# Patient Record
Sex: Female | Born: 1975 | Race: Black or African American | Hispanic: No | Marital: Single | Smoking: Never smoker
Health system: Southern US, Community
[De-identification: ages and names within clinical notes are randomized; demographics above are authoritative.]

## PROBLEM LIST (undated history)

## (undated) DIAGNOSIS — K219 Gastro-esophageal reflux disease without esophagitis: Secondary | ICD-10-CM

## (undated) DIAGNOSIS — F419 Anxiety disorder, unspecified: Secondary | ICD-10-CM

## (undated) DIAGNOSIS — K5792 Diverticulitis of intestine, part unspecified, without perforation or abscess without bleeding: Secondary | ICD-10-CM

## (undated) DIAGNOSIS — E7800 Pure hypercholesterolemia, unspecified: Secondary | ICD-10-CM

## (undated) DIAGNOSIS — L509 Urticaria, unspecified: Secondary | ICD-10-CM

## (undated) DIAGNOSIS — I10 Essential (primary) hypertension: Secondary | ICD-10-CM

## (undated) HISTORY — DX: Anxiety disorder, unspecified: F41.9

## (undated) HISTORY — DX: Urticaria, unspecified: L50.9

---

## 2008-10-12 ENCOUNTER — Emergency Department (HOSPITAL_BASED_OUTPATIENT_CLINIC_OR_DEPARTMENT_OTHER): Admission: EM | Admit: 2008-10-12 | Discharge: 2008-10-12 | Payer: Self-pay | Admitting: Emergency Medicine

## 2010-12-20 ENCOUNTER — Encounter: Payer: Self-pay | Admitting: Internal Medicine

## 2011-08-28 ENCOUNTER — Emergency Department (INDEPENDENT_AMBULATORY_CARE_PROVIDER_SITE_OTHER): Payer: BC Managed Care – PPO

## 2011-08-28 ENCOUNTER — Encounter: Payer: Self-pay | Admitting: Emergency Medicine

## 2011-08-28 ENCOUNTER — Emergency Department (HOSPITAL_BASED_OUTPATIENT_CLINIC_OR_DEPARTMENT_OTHER)
Admission: EM | Admit: 2011-08-28 | Discharge: 2011-08-28 | Disposition: A | Discharge status: Home / Self Care | Payer: BC Managed Care – PPO | Attending: Emergency Medicine | Admitting: Emergency Medicine

## 2011-08-28 ENCOUNTER — Other Ambulatory Visit: Payer: Self-pay

## 2011-08-28 DIAGNOSIS — I10 Essential (primary) hypertension: Secondary | ICD-10-CM | POA: Insufficient documentation

## 2011-08-28 DIAGNOSIS — R0602 Shortness of breath: Secondary | ICD-10-CM

## 2011-08-28 DIAGNOSIS — E7800 Pure hypercholesterolemia, unspecified: Secondary | ICD-10-CM | POA: Insufficient documentation

## 2011-08-28 DIAGNOSIS — R079 Chest pain, unspecified: Secondary | ICD-10-CM | POA: Insufficient documentation

## 2011-08-28 DIAGNOSIS — K219 Gastro-esophageal reflux disease without esophagitis: Secondary | ICD-10-CM | POA: Insufficient documentation

## 2011-08-28 HISTORY — DX: Gastro-esophageal reflux disease without esophagitis: K21.9

## 2011-08-28 HISTORY — DX: Essential (primary) hypertension: I10

## 2011-08-28 HISTORY — DX: Pure hypercholesterolemia, unspecified: E78.00

## 2011-08-28 LAB — CBC
HCT: 36.8 % (ref 36.0–46.0)
MCH: 25.8 pg — ABNORMAL LOW (ref 26.0–34.0)
Platelets: 285 10*3/uL (ref 150–400)
RBC: 4.72 MIL/uL (ref 3.87–5.11)
RDW: 14 % (ref 11.5–15.5)

## 2011-08-28 LAB — DIFFERENTIAL
Basophils Relative: 0 % (ref 0–1)
Lymphocytes Relative: 35 % (ref 12–46)
Lymphs Abs: 2.9 10*3/uL (ref 0.7–4.0)
Monocytes Relative: 9 % (ref 3–12)
Neutro Abs: 4.4 10*3/uL (ref 1.7–7.7)
Neutrophils Relative %: 53 % (ref 43–77)

## 2011-08-28 LAB — BASIC METABOLIC PANEL
Chloride: 100 mEq/L (ref 96–112)
Creatinine, Ser: 0.6 mg/dL (ref 0.50–1.10)
Glucose, Bld: 98 mg/dL (ref 70–99)

## 2011-08-28 LAB — CARDIAC PANEL(CRET KIN+CKTOT+MB+TROPI)
Relative Index: INVALID (ref 0.0–2.5)
Total CK: 99 U/L (ref 7–177)
Troponin I: <0.3 ng/mL (ref <0.30)
Troponin I: <0.3 ng/mL (ref <0.30)

## 2011-08-28 LAB — PROTIME-INR: Prothrombin Time: 13 seconds (ref 11.6–15.2)

## 2011-08-28 MED ORDER — IBUPROFEN 800 MG PO TABS: 800.0000 mg | ORAL_TABLET | Freq: Three times a day (TID) | ORAL | Status: AC

## 2011-08-28 MED ORDER — ASPIRIN 325 MG PO TABS
325.0000 mg | ORAL_TABLET | Freq: Once | ORAL | Status: AC
  Administered 2011-08-28: 325 mg via ORAL
  Filled 2011-08-28: qty 1

## 2011-08-28 MED ORDER — HYDROCODONE-ACETAMINOPHEN 5-325 MG PO TABS
1.0000 | ORAL_TABLET | Freq: Once | ORAL | Status: AC
  Administered 2011-08-28: 1 via ORAL
  Filled 2011-08-28: qty 1

## 2011-08-28 NOTE — ED Notes (Signed)
Pt states she was in choir practice last night and became SOB.  Pt states her chest felt tight and she felt bad.  No dizziness or lightheadedness.  Some nausea, no vomitting.  No diaphoresis.  Pain worse with deep breath and moves to her back and breast bone.  Pt states it felt like it was going down her left arm.

## 2011-08-28 NOTE — ED Provider Notes (Signed)
History     CSN: [id number] Arrival date & time: 08/28/2011 11:40 AM  Chief Complaint  Patient presents with  . Chest Pain  . Shortness of Breath    (Consider location/radiation/quality/duration/timing/severity/associated sxs/prior treatment) HPI Comments: Patient has had constant substernal chest pain for the past week.  It varies in intensity but is always there in some fashion. Never goes away completely. It is substernal radiating underneath her left breast. Today she got concerned when the pain seemed to go down her left arm. She also describes a sensation of difficulty taking a deeper breath, feeling short of breath. She had an episode of what she thinks is anxiety last night at choir practice when she became acutely short of breath and some chest tightness. That resolved with rest. She denies any cough, fever, vomiting, diaphoresis. She has not had any lightheadedness or dizziness. She does have a history of an ulcer, hypertension and hyper lipidemia. She reports having a negative stress test several years ago. Does not smoke or use illicit drugs.  The history is provided by the patient.    Past Medical History  Diagnosis Date  . Hypertension   . Hypercholesteremia   . GERD (gastroesophageal reflux disease)   . Gastric ulcer     History reviewed. No pertinent past surgical history.  History reviewed. No pertinent family history.  History  Substance Use Topics  . Smoking status: Never Smoker   . Smokeless tobacco: Never Used  . Alcohol Use: Yes     occasional    OB History    Grav Para Term Preterm Abortions TAB SAB Ect Mult Living                  Review of Systems  Constitutional: Negative for fever, activity change and appetite change.  HENT: Negative for congestion and rhinorrhea.   Respiratory: Positive for chest tightness and shortness of breath. Negative for cough.   Cardiovascular: Positive for chest pain. Negative for palpitations.  Gastrointestinal:  Positive for nausea. Negative for vomiting and abdominal pain.  Genitourinary: Negative for dysuria and hematuria.  Musculoskeletal: Negative for back pain.  Neurological: Negative for dizziness, syncope and headaches.    Allergies  Review of patient's allergies indicates no known allergies.  Home Medications   Current Outpatient Rx  Name Route Sig Dispense Refill  . ATTAPULGITE 750 MG/15ML PO LIQD Oral Take 15 mLs by mouth 3 (three) times daily.      Marland Kitchen LISINOPRIL-HYDROCHLOROTHIAZIDE 20-25 MG PO TABS Oral Take 1 tablet by mouth daily.      Marland Kitchen OMEPRAZOLE 20 MG PO CPDR Oral Take 20 mg by mouth daily.      Marland Kitchen ZOLPIDEM TARTRATE 10 MG PO TABS Oral Take 10 mg by mouth at bedtime as needed.        BP 116/71  Pulse 76  Temp(Src) 98.2 F (36.8 C) (Oral)  Resp 18  Ht 5\' 6"  (1.676 m)  Wt 228 lb (103.42 kg)  BMI 36.80 kg/m2  SpO2 100%  LMP 08/27/2011  Physical Exam  Constitutional: She is oriented to person, place, and time. She appears well-developed and well-nourished. No distress.  HENT:  Head: Normocephalic and atraumatic.  Mouth/Throat: Oropharynx is clear and moist. No oropharyngeal exudate.  Eyes: Conjunctivae are normal. Pupils are equal, round, and reactive to light.  Neck: Normal range of motion.  Cardiovascular: Normal rate, regular rhythm and normal heart sounds.   No murmur heard. Pulmonary/Chest: Effort normal and breath sounds normal. No respiratory distress. She  exhibits tenderness.       Pain is reproducible to palpation along the left sternal border  Abdominal: Soft. There is no tenderness. There is no rebound and no guarding.  Musculoskeletal: Normal range of motion. She exhibits no edema and no tenderness.  Neurological: She is alert and oriented to person, place, and time. No cranial nerve deficit.  Skin: Skin is warm.    ED Course  Procedures (including critical care time)  Labs Reviewed  CBC - Abnormal; Notable for the following:    MCH 25.8 (*)    All  other components within normal limits  DIFFERENTIAL  BASIC METABOLIC PANEL  D-DIMER, QUANTITATIVE  CARDIAC PANEL(CRET KIN+CKTOT+MB+TROPI)  PROTIME-INR  CARDIAC PANEL(CRET KIN+CKTOT+MB+TROPI)   Dg Chest 2 View  08/28/2011  *RADIOLOGY REPORT*  Clinical Data: Shortness of breath.  CHEST - 2 VIEW  Comparison: Chest x-ray 10/12/2008.  Findings: The cardiac silhouette, mediastinal and hilar contours are within normal limits and stable. The lungs are clear.  No pleural effusions.  The bony thorax is intact.  IMPRESSION: Normal chest x-ray.  No change since prior study.  Original Report Authenticated By: P. Loralie Champagne, M.D.     No diagnosis found.    MDM  Given description of pain and chronicity is atypical for ACS however patient does have risk factors including obesity, hypertension and hyperlipidemia. HEART score is 1 Will obtain labs with Ddimer, CXR.   Date: 08/28/2011  Rate: 75  Rhythm: normal sinus rhythm  QRS Axis: normal  Intervals: normal  ST/T Wave abnormalities: nonspecific T wave changes  Conduction Disutrbances:none  Narrative Interpretation: T-wave flattening laterally is unchanged  Old EKG Reviewed: unchanged  Chest x-ray, D-dimer and other labs are normal.  Delta troponin is negative.  I do not think patient's symptoms are consistent with ACS. She does have some reproducibility to palpation and a likely component of anxiety as well. I feel she has been ruled out from an emergency standpoint in terms of life threatening causes of chest pain occurs followup with her doctor for an outpatient stress test as indicated.        Glynn Octave, MD 08/28/11 [phone]

## 2011-09-01 LAB — DIFFERENTIAL
Basophils Relative: 1
Monocytes Absolute: 0.5
Neutro Abs: 4.3
Neutrophils Relative %: 56

## 2011-09-01 LAB — BASIC METABOLIC PANEL
Calcium: 9.3
Chloride: 99
GFR calc Af Amer: >60
GFR calc non Af Amer: >60
Potassium: 3.5

## 2011-09-01 LAB — POCT CARDIAC MARKERS
CKMB, poc: <1 — ABNORMAL LOW
Troponin i, poc: <0.05

## 2011-09-01 LAB — CBC
HCT: 38.3
MCV: 77.4 — ABNORMAL LOW

## 2011-09-22 ENCOUNTER — Ambulatory Visit: Payer: BC Managed Care – PPO | Admitting: Internal Medicine

## 2011-10-14 ENCOUNTER — Ambulatory Visit: Payer: BC Managed Care – PPO | Admitting: Internal Medicine

## 2011-10-25 ENCOUNTER — Encounter (HOSPITAL_BASED_OUTPATIENT_CLINIC_OR_DEPARTMENT_OTHER): Payer: Self-pay | Admitting: *Deleted

## 2011-10-25 ENCOUNTER — Emergency Department (INDEPENDENT_AMBULATORY_CARE_PROVIDER_SITE_OTHER): Payer: BC Managed Care – PPO

## 2011-10-25 ENCOUNTER — Emergency Department (HOSPITAL_BASED_OUTPATIENT_CLINIC_OR_DEPARTMENT_OTHER)
Admission: EM | Admit: 2011-10-25 | Discharge: 2011-10-25 | Disposition: A | Discharge status: Home / Self Care | Payer: BC Managed Care – PPO | Attending: Emergency Medicine | Admitting: Emergency Medicine

## 2011-10-25 DIAGNOSIS — K5730 Diverticulosis of large intestine without perforation or abscess without bleeding: Secondary | ICD-10-CM | POA: Insufficient documentation

## 2011-10-25 DIAGNOSIS — R109 Unspecified abdominal pain: Secondary | ICD-10-CM

## 2011-10-25 DIAGNOSIS — I10 Essential (primary) hypertension: Secondary | ICD-10-CM | POA: Insufficient documentation

## 2011-10-25 DIAGNOSIS — E7800 Pure hypercholesterolemia, unspecified: Secondary | ICD-10-CM | POA: Insufficient documentation

## 2011-10-25 DIAGNOSIS — R110 Nausea: Secondary | ICD-10-CM

## 2011-10-25 DIAGNOSIS — K449 Diaphragmatic hernia without obstruction or gangrene: Secondary | ICD-10-CM

## 2011-10-25 DIAGNOSIS — K5790 Diverticulosis of intestine, part unspecified, without perforation or abscess without bleeding: Secondary | ICD-10-CM

## 2011-10-25 LAB — CBC
MCHC: 33.2 g/dL (ref 30.0–36.0)
Platelets: 278 10*3/uL (ref 150–400)
RDW: 14.3 % (ref 11.5–15.5)
WBC: 12.7 10*3/uL — ABNORMAL HIGH (ref 4.0–10.5)

## 2011-10-25 LAB — HEPATIC FUNCTION PANEL
Albumin: 3.6 g/dL (ref 3.5–5.2)
Alkaline Phosphatase: 65 U/L (ref 39–117)
Total Protein: 7.3 g/dL (ref 6.0–8.3)

## 2011-10-25 LAB — URINE MICROSCOPIC-ADD ON

## 2011-10-25 LAB — BASIC METABOLIC PANEL
BUN: 14 mg/dL (ref 6–23)
GFR calc non Af Amer: >90 mL/min (ref >90)
Glucose, Bld: 107 mg/dL — ABNORMAL HIGH (ref 70–99)
Potassium: 3.7 mEq/L (ref 3.5–5.1)

## 2011-10-25 LAB — URINALYSIS, ROUTINE W REFLEX MICROSCOPIC
Bilirubin Urine: NEGATIVE
Hgb urine dipstick: NEGATIVE
Ketones, ur: NEGATIVE mg/dL
Nitrite: NEGATIVE
Specific Gravity, Urine: 1.016 (ref 1.005–1.030)

## 2011-10-25 LAB — DIFFERENTIAL
Eosinophils Absolute: 0.2 10*3/uL (ref 0.0–0.7)
Eosinophils Relative: 2 % (ref 0–5)
Lymphs Abs: 3.7 10*3/uL (ref 0.7–4.0)
Monocytes Absolute: 0.9 10*3/uL (ref 0.1–1.0)
Monocytes Relative: 7 % (ref 3–12)
Neutrophils Relative %: 62 % (ref 43–77)

## 2011-10-25 LAB — PREGNANCY, URINE: Preg Test, Ur: NEGATIVE

## 2011-10-25 MED ORDER — METRONIDAZOLE 500 MG PO TABS: 500.0000 mg | ORAL_TABLET | Freq: Two times a day (BID) | ORAL | Status: AC

## 2011-10-25 MED ORDER — CIPROFLOXACIN HCL 500 MG PO TABS: 500.0000 mg | ORAL_TABLET | Freq: Two times a day (BID) | ORAL | Status: AC

## 2011-10-25 MED ORDER — ONDANSETRON HCL 4 MG/2ML IJ SOLN
4.0000 mg | Freq: Once | INTRAMUSCULAR | Status: AC
  Administered 2011-10-25: 4 mg via INTRAVENOUS
  Filled 2011-10-25: qty 2

## 2011-10-25 MED ORDER — FLUCONAZOLE 50 MG PO TABS
150.0000 mg | ORAL_TABLET | Freq: Once | ORAL | Status: AC
  Administered 2011-10-25: 150 mg via ORAL
  Filled 2011-10-25: qty 1

## 2011-10-25 MED ORDER — FENTANYL CITRATE 0.05 MG/ML IJ SOLN
25.0000 ug | Freq: Once | INTRAMUSCULAR | Status: AC
  Administered 2011-10-25: 25 ug via INTRAVENOUS
  Filled 2011-10-25: qty 2

## 2011-10-25 MED ORDER — FAMOTIDINE IN NACL 20-0.9 MG/50ML-% IV SOLN
20.0000 mg | Freq: Once | INTRAVENOUS | Status: AC
  Administered 2011-10-25: 20 mg via INTRAVENOUS
  Filled 2011-10-25: qty 50

## 2011-10-25 MED ORDER — ONDANSETRON HCL 4 MG PO TABS: 4.0000 mg | ORAL_TABLET | Freq: Four times a day (QID) | ORAL | Status: AC

## 2011-10-25 MED ORDER — HYDROCODONE-ACETAMINOPHEN 5-500 MG PO TABS: 1.0000 | ORAL_TABLET | Freq: Four times a day (QID) | ORAL | Status: AC | PRN

## 2011-10-25 MED ORDER — FENTANYL CITRATE 0.05 MG/ML IJ SOLN
INTRAMUSCULAR | Status: AC
  Administered 2011-10-25: 25 ug
  Filled 2011-10-25: qty 2

## 2011-10-25 MED ORDER — PANTOPRAZOLE SODIUM 40 MG IV SOLR
40.0000 mg | Freq: Once | INTRAVENOUS | Status: AC
  Administered 2011-10-25: 40 mg via INTRAVENOUS
  Filled 2011-10-25: qty 40

## 2011-10-25 MED ORDER — IOHEXOL 300 MG/ML  SOLN
100.0000 mL | Freq: Once | INTRAMUSCULAR | Status: AC | PRN
  Administered 2011-10-25: 100 mL via INTRAVENOUS

## 2011-10-25 NOTE — ED Notes (Signed)
Pt reports generalized abd pain and nausea that began yesterday Pm around 1700 pt took a gas x last Pm with no relief

## 2011-10-25 NOTE — ED Provider Notes (Signed)
History     CSN: [id number] Arrival date & time: 10/25/2011  4:36 AM   First MD Initiated Contact with Patient 10/25/11 0507      Chief Complaint  Patient presents with  . Abdominal Pain    (Consider location/radiation/quality/duration/timing/severity/associated sxs/prior treatment) Patient is a 35 y.o. female presenting with abdominal pain. The history is provided by the patient.  Abdominal Pain The primary symptoms of the illness include abdominal pain. The primary symptoms of the illness do not include fever, shortness of breath, vomiting, diarrhea or dysuria. The current episode started 6 to 12 hours ago. The onset of the illness was gradual. The problem has not changed since onset. Associated with: Started about 12 hours after eating a hamburger. Symptoms associated with the illness do not include chills, constipation or back pain.   pain constant since onset and are relieved by over-the-counter medications at home. Some nausea no vomiting. No diarrhea or constipation. No history of similar pain. Sharp in quality and not radiating from "all over" but mostly upper abdomen.  Past Medical History  Diagnosis Date  . Hypertension   . Hypercholesteremia   . GERD (gastroesophageal reflux disease)   . Gastric ulcer     History reviewed. No pertinent past surgical history.  History reviewed. No pertinent family history.  History  Substance Use Topics  . Smoking status: Never Smoker   . Smokeless tobacco: Never Used  . Alcohol Use: Yes     occasional    OB History    Grav Para Term Preterm Abortions TAB SAB Ect Mult Living                  Review of Systems  Constitutional: Negative for fever and chills.  HENT: Negative for neck pain and neck stiffness.   Eyes: Negative for pain.  Respiratory: Negative for shortness of breath.   Cardiovascular: Negative for chest pain.  Gastrointestinal: Positive for abdominal pain. Negative for vomiting, diarrhea, constipation, blood  in stool and rectal pain.  Genitourinary: Negative for dysuria, flank pain and pelvic pain.  Musculoskeletal: Negative for back pain.  Skin: Negative for rash.  Neurological: Negative for headaches.  All other systems reviewed and are negative.    Allergies  Review of patient's allergies indicates no known allergies.  Home Medications   Current Outpatient Rx  Name Route Sig Dispense Refill  . ATTAPULGITE 750 MG/15ML PO LIQD Oral Take 15 mLs by mouth 3 (three) times daily.      Marland Kitchen LISINOPRIL-HYDROCHLOROTHIAZIDE 20-25 MG PO TABS Oral Take 1 tablet by mouth daily.      Marland Kitchen OMEPRAZOLE 20 MG PO CPDR Oral Take 20 mg by mouth daily.      Marland Kitchen ZOLPIDEM TARTRATE 10 MG PO TABS Oral Take 10 mg by mouth at bedtime as needed.        BP 112/71  Pulse 91  Temp(Src) 98.9 F (37.2 C) (Oral)  Resp 20  Ht 5\' 6"  (1.676 m)  Wt 224 lb (101.606 kg)  BMI 36.15 kg/m2  SpO2 100%  LMP 10/18/2011  Physical Exam  Constitutional: She is oriented to person, place, and time. She appears well-developed and well-nourished.  HENT:  Head: Normocephalic and atraumatic.  Eyes: Conjunctivae and EOM are normal. Pupils are equal, round, and reactive to light.  Neck: Trachea normal. Neck supple. No thyromegaly present.  Cardiovascular: Normal rate, regular rhythm, S1 normal, S2 normal and normal pulses.     No systolic murmur is present   No diastolic murmur  is present  Pulses:      Radial pulses are 2+ on the right side, and 2+ on the left side.  Pulmonary/Chest: Effort normal and breath sounds normal. She has no wheezes. She has no rhonchi. She has no rales. She exhibits no tenderness.  Abdominal: Soft. Normal appearance and bowel sounds are normal. She exhibits no mass. There is tenderness. There is no rebound, no guarding, no CVA tenderness and negative Murphy's sign.       Obese with tenderness mostly periumbical and LLQ  Musculoskeletal:       BLE:s Calves nontender, no cords or erythema, negative Homans sign   Neurological: She is alert and oriented to person, place, and time. She has normal strength. No cranial nerve deficit or sensory deficit. GCS eye subscore is 4. GCS verbal subscore is 5. GCS motor subscore is 6.  Skin: Skin is warm and dry. No rash noted. She is not diaphoretic.  Psychiatric: Her speech is normal.       Cooperative and appropriate    ED Course  Procedures (including critical care time)  Results for orders placed during the hospital encounter of 10/25/11  PREGNANCY, URINE      Component Value Range   Preg Test, Ur NEGATIVE    URINALYSIS, ROUTINE W REFLEX MICROSCOPIC      Component Value Range   Color, Urine YELLOW  YELLOW    Appearance CLEAR  CLEAR    Specific Gravity, Urine 1.016  1.005 - 1.030    pH 6.0  5.0 - 8.0    Glucose, UA NEGATIVE  NEGATIVE (mg/dL)   Hgb urine dipstick NEGATIVE  NEGATIVE    Bilirubin Urine NEGATIVE  NEGATIVE    Ketones, ur NEGATIVE  NEGATIVE (mg/dL)   Protein, ur NEGATIVE  NEGATIVE (mg/dL)   Urobilinogen, UA 0.2  0.0 - 1.0 (mg/dL)   Nitrite NEGATIVE  NEGATIVE    Leukocytes, UA SMALL (*) NEGATIVE   CBC      Component Value Range   WBC 12.7 (*) 4.0 - 10.5 (K/uL)   RBC 4.59  3.87 - 5.11 (MIL/uL)   Hemoglobin 12.0  12.0 - 15.0 (g/dL)   HCT 54.0  98.1 - 19.1 (%)   MCV 78.6  78.0 - 100.0 (fL)   MCH 26.1  26.0 - 34.0 (pg)   MCHC 33.2  30.0 - 36.0 (g/dL)   RDW 47.8  29.5 - 62.1 (%)   Platelets 278  150 - 400 (K/uL)  DIFFERENTIAL      Component Value Range   Neutrophils Relative 62  43 - 77 (%)   Neutro Abs 7.9 (*) 1.7 - 7.7 (K/uL)   Lymphocytes Relative 29  12 - 46 (%)   Lymphs Abs 3.7  0.7 - 4.0 (K/uL)   Monocytes Relative 7  3 - 12 (%)   Monocytes Absolute 0.9  0.1 - 1.0 (K/uL)   Eosinophils Relative 2  0 - 5 (%)   Eosinophils Absolute 0.2  0.0 - 0.7 (K/uL)   Basophils Relative 0  0 - 1 (%)   Basophils Absolute 0.0  0.0 - 0.1 (K/uL)  BASIC METABOLIC PANEL      Component Value Range   Sodium 137  135 - 145 (mEq/L)   Potassium  3.7  3.5 - 5.1 (mEq/L)   Chloride 100  96 - 112 (mEq/L)   CO2 28  19 - 32 (mEq/L)   Glucose, Bld 107 (*) 70 - 99 (mg/dL)   BUN 14  6 - 23 (  mg/dL)   Creatinine, Ser 6.57  0.50 - 1.10 (mg/dL)   Calcium 9.1  8.4 - 84.6 (mg/dL)   GFR calc non Af Amer >90  >90 (mL/min)   GFR calc Af Amer >90  >90 (mL/min)  URINE MICROSCOPIC-ADD ON      Component Value Range   Squamous Epithelial / LPF FEW (*) RARE    WBC, UA 7-10  <3 (WBC/hpf)   RBC / HPF 0-2  <3 (RBC/hpf)   Bacteria, UA MANY (*) RARE   LIPASE, BLOOD      Component Value Range   Lipase 40  11 - 59 (U/L)  HEPATIC FUNCTION PANEL      Component Value Range   Total Protein 7.3  6.0 - 8.3 (g/dL)   Albumin 3.6  3.5 - 5.2 (g/dL)   AST 16  0 - 37 (U/L)   ALT 15  0 - 35 (U/L)   Alkaline Phosphatase 65  39 - 117 (U/L)   Total Bilirubin 0.6  0.3 - 1.2 (mg/dL)   Bilirubin, Direct 0.1  0.0 - 0.3 (mg/dL)   Indirect Bilirubin 0.5  0.3 - 0.9 (mg/dL)   CT reviewed with radiologist has hiatal hernia, diverticulosis and possible mild diverticulitis     MDM   ABD pain evaluated with ;abs, CT and treated with pain medications and IVFs. Nausea improved with zofran. Pain improved. PT had GI physician and is stable for outpatient treatment mild diverticular disease.         Sunnie Nielsen, MD 10/25/11 [phone]

## 2011-10-26 LAB — URINE CULTURE
Colony Count: NO GROWTH
Culture: NO GROWTH

## 2011-11-15 ENCOUNTER — Ambulatory Visit (INDEPENDENT_AMBULATORY_CARE_PROVIDER_SITE_OTHER): Payer: BC Managed Care – PPO | Admitting: Internal Medicine

## 2011-11-15 ENCOUNTER — Encounter: Payer: Self-pay | Admitting: Internal Medicine

## 2011-11-15 DIAGNOSIS — I10 Essential (primary) hypertension: Secondary | ICD-10-CM | POA: Insufficient documentation

## 2011-11-15 DIAGNOSIS — K219 Gastro-esophageal reflux disease without esophagitis: Secondary | ICD-10-CM | POA: Insufficient documentation

## 2011-11-15 DIAGNOSIS — G4700 Insomnia, unspecified: Secondary | ICD-10-CM | POA: Insufficient documentation

## 2011-11-15 MED ORDER — ZOLPIDEM TARTRATE 10 MG PO TABS: 10.0000 mg | ORAL_TABLET | Freq: Every evening | ORAL | Status: DC | PRN

## 2011-11-15 MED ORDER — DEXLANSOPRAZOLE 60 MG PO CPDR: 60.0000 mg | DELAYED_RELEASE_CAPSULE | Freq: Every day | ORAL | Status: AC

## 2011-11-15 NOTE — Patient Instructions (Addendum)

## 2011-11-15 NOTE — Assessment & Plan Note (Signed)
Her BP is well controlled 

## 2011-11-15 NOTE — Assessment & Plan Note (Signed)
She has not done well on prilosec and she tells me that she previously took dexilant so I asked her to restart that

## 2011-11-15 NOTE — Assessment & Plan Note (Signed)
She will continue ambien as needed 

## 2011-11-15 NOTE — Progress Notes (Signed)
Subjective:    Patient ID: Faith Sellers, female    DOB: [birth year], 35 y.o.   MRN: [id number]  Gastrophageal Reflux She complains of heartburn. She reports no abdominal pain, no belching, no chest pain, no choking, no coughing, no dysphagia, no early satiety, no globus sensation, no hoarse voice, no nausea, no sore throat, no stridor, no tooth decay, no water brash or no wheezing. This is a recurrent problem. The current episode started more than 1 year ago. The problem occurs frequently. The problem has been unchanged. The heartburn duration is several minutes. The heartburn is located in the substernum. The heartburn is of mild intensity. The heartburn does not wake her from sleep. The heartburn does not limit her activity. The heartburn doesn't change with position. The symptoms are aggravated by certain foods. Pertinent negatives include no anemia, fatigue, melena, muscle weakness, orthopnea or weight loss. Risk factors include no known risk factors. She has tried a PPI for the symptoms. The treatment provided mild relief. Past procedures include an EGD.  Hypertension This is a chronic problem. The current episode started more than 1 year ago. The problem has been gradually improving since onset. The problem is controlled. Pertinent negatives include no anxiety, blurred vision, chest pain, headaches, malaise/fatigue, neck pain, orthopnea, palpitations, peripheral edema, PND, shortness of breath or sweats. There are no associated agents to hypertension. Past treatments include ACE inhibitors and diuretics. The current treatment provides significant improvement. Compliance problems include exercise and diet.       Review of Systems  Constitutional: Negative for fever, weight loss, malaise/fatigue, diaphoresis, activity change, appetite change, fatigue and unexpected weight change.  HENT: Negative for sore throat, hoarse voice, facial swelling, trouble swallowing, neck pain, neck stiffness and voice  change.   Eyes: Negative.  Negative for blurred vision.  Respiratory: Negative for apnea, cough, choking, chest tightness, shortness of breath, wheezing and stridor.   Cardiovascular: Negative for chest pain, palpitations, orthopnea, leg swelling and PND.  Gastrointestinal: Positive for heartburn. Negative for dysphagia, nausea, vomiting, abdominal pain, diarrhea, constipation, blood in stool, melena, abdominal distention and rectal pain.  Genitourinary: Negative.   Musculoskeletal: Negative for myalgias, back pain, joint swelling, arthralgias, gait problem and muscle weakness.  Skin: Negative for color change, pallor, rash and wound.  Neurological: Negative for dizziness, tremors, seizures, syncope, facial asymmetry, speech difficulty, weakness, light-headedness, numbness and headaches.  Hematological: Negative for adenopathy. Does not bruise/bleed easily.  Psychiatric/Behavioral: Positive for sleep disturbance (DFA). Negative for suicidal ideas, hallucinations, behavioral problems, confusion, self-injury, dysphoric mood, decreased concentration and agitation. The patient is not nervous/anxious and is not hyperactive.        Objective:   Physical Exam  Vitals reviewed. Constitutional: She is oriented to person, place, and time. She appears well-developed and well-nourished. No distress.  HENT:  Head: Normocephalic and atraumatic.  Mouth/Throat: Oropharynx is clear and moist. No oropharyngeal exudate.  Eyes: Conjunctivae are normal. Right eye exhibits no discharge. Left eye exhibits no discharge. No scleral icterus.  Neck: Normal range of motion. Neck supple. No JVD present. No tracheal deviation present. No thyromegaly present.  Cardiovascular: Normal rate, regular rhythm, normal heart sounds and intact distal pulses.  Exam reveals no gallop and no friction rub.   No murmur heard. Pulmonary/Chest: Effort normal and breath sounds normal. No stridor. No respiratory distress. She has no  wheezes. She has no rales. She exhibits no tenderness.  Abdominal: Soft. Bowel sounds are normal. She exhibits no distension. There is no tenderness. There is no  rebound and no guarding.  Musculoskeletal: Normal range of motion. She exhibits no edema and no tenderness.  Lymphadenopathy:    She has no cervical adenopathy.  Neurological: She is oriented to person, place, and time.  Skin: Skin is warm and dry. No rash noted. She is not diaphoretic. No erythema. No pallor.  Psychiatric: She has a normal mood and affect. Her behavior is normal. Judgment and thought content normal.      Lab Results  Component Value Date   WBC 12.7* 10/25/2011   HGB 12.0 10/25/2011   HCT 36.1 10/25/2011   PLT 278 10/25/2011   GLUCOSE 107* 10/25/2011   ALT 15 10/25/2011   AST 16 10/25/2011   NA 137 10/25/2011   K 3.7 10/25/2011   CL 100 10/25/2011   CREATININE 0.60 10/25/2011   BUN 14 10/25/2011   CO2 28 10/25/2011   INR 0.96 08/28/2011     Assessment & Plan:

## 2012-02-02 ENCOUNTER — Encounter: Payer: Self-pay | Admitting: Women's Health

## 2012-02-02 ENCOUNTER — Ambulatory Visit (INDEPENDENT_AMBULATORY_CARE_PROVIDER_SITE_OTHER): Payer: BC Managed Care – PPO | Admitting: Women's Health

## 2012-02-02 VITALS — BP 126/78 | Ht 66.0 in | Wt 223.0 lb

## 2012-02-02 DIAGNOSIS — B379 Candidiasis, unspecified: Secondary | ICD-10-CM

## 2012-02-02 DIAGNOSIS — N920 Excessive and frequent menstruation with regular cycle: Secondary | ICD-10-CM

## 2012-02-02 DIAGNOSIS — B49 Unspecified mycosis: Secondary | ICD-10-CM

## 2012-02-02 DIAGNOSIS — Z113 Encounter for screening for infections with a predominantly sexual mode of transmission: Secondary | ICD-10-CM

## 2012-02-02 LAB — HM PAP SMEAR: HM Pap smear: NORMAL

## 2012-02-02 MED ORDER — TRANEXAMIC ACID 650 MG PO TABS: 1300.0000 mg | ORAL_TABLET | Freq: Three times a day (TID) | ORAL | Status: DC

## 2012-02-02 MED ORDER — FLUCONAZOLE 150 MG PO TABS: 150.0000 mg | ORAL_TABLET | Freq: Once | ORAL | Status: AC

## 2012-02-02 NOTE — Progress Notes (Signed)
Patient ID: Faith Sellers, female   DOB: 03/22/1976, 36 y.o.   MRN: [id number] Presents to discuss contraception options, menorrhagia, and to establish self for ongoing care. Had a normal annual exam in June of 2012 and a normal TSH one month ago. Divorced, has been sexually active with a new partner/ condom. Hypertensive on lisinopril, nonsmoker. Has used Lysteda in the past with good relief. Currently on cycle. States first 2 days of cycle uses 9-10 pads per day following days she uses about 6 pads per day. Had a normal ultrasound March of 2012-no fibroids  Exam: External genitalia within normal limits, speculum exam copious menstrual type blood. Bimanual no CMT or adnexal fullness or tenderness.  Menorrhagia Contraception counseling Hypertension-primary care labs and meds Obesity  Plan: Finishing out a prescription for upper respiratory infection. Diflucan 150 by mouth with refill given if problems with yeast after antibiotic. Mirena IUD discussed, slight risk for infection, perforation hemorrhage. Handout given and reviewed. Will check insurance coverage, schedule with Dr. Audie Box with a cycle to have placed. lysteda prescription, proper use, to take with cycles. Did review slight increased risk for clots. GC/Chlamydia culture taken and is pending. Declined need for HIV, hepatitis or RPR. Encouraged to continue condoms until placed. Reviewed best to avoid combination birth control pills due to hypertension. Is in the process of trying to decrease calories and increase exercise for weight loss. Encourage counseling for recent  divorce. Return to office in June for annual exam.

## 2012-02-02 NOTE — Patient Instructions (Signed)

## 2012-02-09 ENCOUNTER — Telehealth: Payer: Self-pay | Admitting: *Deleted

## 2012-02-09 NOTE — Telephone Encounter (Signed)
Patient called c/o lump in left breast.  Advised needs ov.

## 2012-02-14 ENCOUNTER — Encounter: Payer: Self-pay | Admitting: *Deleted

## 2012-02-14 NOTE — Progress Notes (Signed)
Patient ID: Faith Sellers, female   DOB: 1976-02-24, 36 y.o.   MRN: [id number] Pt called stating lysteda was written wrong per pharmacy? Left message to call.

## 2012-02-15 ENCOUNTER — Ambulatory Visit (INDEPENDENT_AMBULATORY_CARE_PROVIDER_SITE_OTHER): Payer: BC Managed Care – PPO | Admitting: Gynecology

## 2012-02-15 ENCOUNTER — Encounter: Payer: Self-pay | Admitting: Gynecology

## 2012-02-15 DIAGNOSIS — N63 Unspecified lump in unspecified breast: Secondary | ICD-10-CM

## 2012-02-15 DIAGNOSIS — Z309 Encounter for contraceptive management, unspecified: Secondary | ICD-10-CM

## 2012-02-15 DIAGNOSIS — N926 Irregular menstruation, unspecified: Secondary | ICD-10-CM

## 2012-02-15 DIAGNOSIS — N920 Excessive and frequent menstruation with regular cycle: Secondary | ICD-10-CM

## 2012-02-15 LAB — TSH: TSH: 0.987 u[IU]/mL (ref 0.350–4.500)

## 2012-02-15 NOTE — Progress Notes (Signed)
Patient presents with several issues as noted below.  Exam was Sherrilyn Rist chaperone present Breasts examined In sitting without masses retractions discharge adenopathy. Abdomen soft nontender without masses guarding rebound organomegaly. Pelvic external BUS vagina with moderate menses flow. Cervix normal. Uterus grossly normal, midline mobile nontender. Adnexa without masses or tenderness.  Assessment and plan: 1. Left breast lump. Patient noted several days ago onset of a tender area 6:00 position at the periphery junction of breast and chest wall. On reexam last night she did not feel it but just wanted me to check. Her exam today is normal with no palpable abnormalities in this area or anywhere else in her breasts. Recommend patient continue self breast exams, as long she cannot feel any abnormalities she will follow. If she does feel a recurrence of this abnormality she will let me know and represent for exam and we will consider diagnostic studies to include ultrasound possible mammogram. 2. Irregular menses/menorrhagia. Patient notes her periods will come every other month to every month. She bleeds heavily lasting 7 days with bleedthrough episodes double protection. We'll check baseline labs to include FSH, prolactin, TSH and hCG. Start with sonohysterogram to rule out non-palpable abnormalities and she will follow with me for this.. 3. Contraception. Patient was not consistently using contraception and had talked to [place] about Mirena IUD. She is reluctant to do that now in is considering oral contraceptives. I reviewed the risks of oral contraceptives, particularly with her weight and recent diagnosis of hypertension and the risk of stroke, heart attack, DVT were reviewed with her. I encouraged her to consider Mirena IUD and gave her literature on this and she is going to think more about this. She did use Plan B a week ago and started bleeding irregularly the last day or so and again she'll follow up  for her labs and ultrasound per #2.  She already has a Lysteda ordered for her and she is going to start this today. I reviewed the risks of Lysteda up to including thrombosis risks again stroke heart attack DVT.

## 2012-02-15 NOTE — Patient Instructions (Signed)
Follow up for hormone studies and ultrasound is scheduled.

## 2012-02-16 ENCOUNTER — Telehealth: Payer: Self-pay | Admitting: *Deleted

## 2012-02-16 LAB — HCG, SERUM, QUALITATIVE: Preg, Serum: NEGATIVE

## 2012-02-16 LAB — PROLACTIN: Prolactin: 7.2 ng/mL

## 2012-02-16 NOTE — Telephone Encounter (Signed)
Pt informed of HCG lab results.

## 2012-02-21 ENCOUNTER — Other Ambulatory Visit: Payer: BC Managed Care – PPO

## 2012-02-21 ENCOUNTER — Ambulatory Visit: Payer: BC Managed Care – PPO | Admitting: Gynecology

## 2012-02-29 ENCOUNTER — Telehealth: Payer: Self-pay | Admitting: *Deleted

## 2012-02-29 MED ORDER — FLUCONAZOLE 150 MG PO TABS: 150.0000 mg | ORAL_TABLET | Freq: Once | ORAL | Status: AC

## 2012-02-29 NOTE — Telephone Encounter (Signed)
Pt informed with the below note. 

## 2012-02-29 NOTE — Telephone Encounter (Signed)
Diflucan 150mg x 1 dose

## 2012-02-29 NOTE — Telephone Encounter (Signed)
Pt called c/o yeast infection x 2days tried OTC but no relief. Itching, white discharge only. Pt had taken Levaquin and now has yeast. Pt last OV was 02/15/12. Pt would like dose of diflucan

## 2012-03-01 ENCOUNTER — Ambulatory Visit: Payer: BC Managed Care – PPO | Admitting: Internal Medicine

## 2012-03-02 ENCOUNTER — Ambulatory Visit: Payer: BC Managed Care – PPO | Admitting: Internal Medicine

## 2012-03-03 ENCOUNTER — Other Ambulatory Visit (INDEPENDENT_AMBULATORY_CARE_PROVIDER_SITE_OTHER): Payer: BC Managed Care – PPO

## 2012-03-03 ENCOUNTER — Ambulatory Visit (INDEPENDENT_AMBULATORY_CARE_PROVIDER_SITE_OTHER): Payer: BC Managed Care – PPO | Admitting: Internal Medicine

## 2012-03-03 ENCOUNTER — Encounter: Payer: Self-pay | Admitting: Internal Medicine

## 2012-03-03 VITALS — BP 120/72 | HR 87 | Temp 98.7°F | Resp 16 | Wt 217.0 lb

## 2012-03-03 DIAGNOSIS — D72829 Elevated white blood cell count, unspecified: Secondary | ICD-10-CM

## 2012-03-03 DIAGNOSIS — G4700 Insomnia, unspecified: Secondary | ICD-10-CM

## 2012-03-03 DIAGNOSIS — E7800 Pure hypercholesterolemia, unspecified: Secondary | ICD-10-CM | POA: Insufficient documentation

## 2012-03-03 DIAGNOSIS — I10 Essential (primary) hypertension: Secondary | ICD-10-CM

## 2012-03-03 DIAGNOSIS — R7309 Other abnormal glucose: Secondary | ICD-10-CM

## 2012-03-03 DIAGNOSIS — R7303 Prediabetes: Secondary | ICD-10-CM | POA: Insufficient documentation

## 2012-03-03 DIAGNOSIS — N760 Acute vaginitis: Secondary | ICD-10-CM

## 2012-03-03 LAB — CBC WITH DIFFERENTIAL/PLATELET
Basophils Relative: 0.6 % (ref 0.0–3.0)
Eosinophils Relative: 2.3 % (ref 0.0–5.0)
Lymphocytes Relative: 32 % (ref 12.0–46.0)
Neutrophils Relative %: 55.9 % (ref 43.0–77.0)
RBC: 4.4 Mil/uL (ref 3.87–5.11)
WBC: 9.7 10*3/uL (ref 4.5–10.5)

## 2012-03-03 LAB — COMPREHENSIVE METABOLIC PANEL
AST: 22 U/L (ref 0–37)
Albumin: 4 g/dL (ref 3.5–5.2)
BUN: 12 mg/dL (ref 6–23)
Calcium: 9.6 mg/dL (ref 8.4–10.5)
Chloride: 107 mEq/L (ref 96–112)
Glucose, Bld: 101 mg/dL — ABNORMAL HIGH (ref 70–99)
Potassium: 3.7 mEq/L (ref 3.5–5.1)
Total Protein: 8.2 g/dL (ref 6.0–8.3)

## 2012-03-03 LAB — WET PREP, GENITAL
Trich, Wet Prep: NONE SEEN
Yeast Wet Prep HPF POC: NONE SEEN

## 2012-03-03 LAB — LIPID PANEL: Cholesterol: 163 mg/dL (ref 0–200)

## 2012-03-03 LAB — HEMOGLOBIN A1C: Hgb A1c MFr Bld: 6.2 % (ref 4.6–6.5)

## 2012-03-03 LAB — HCG, QUANTITATIVE, PREGNANCY: hCG, Beta Chain, Quant, S: 1.04 m[IU]/mL

## 2012-03-03 LAB — TSH: TSH: 0.66 u[IU]/mL (ref 0.35–5.50)

## 2012-03-03 MED ORDER — ZOLPIDEM TARTRATE 10 MG PO TABS: 10.0000 mg | ORAL_TABLET | Freq: Every evening | ORAL | Status: DC | PRN

## 2012-03-03 NOTE — Patient Instructions (Signed)
Vaginitis Vaginitis is an infection. It causes soreness, swelling, and redness (inflammation) of the vagina. Many of these infections are sexually transmitted diseases (STDs). Having unprotected sex can cause further problems and complications such as:  Chronic pelvic pain.   Infertility.   Unwanted pregnancy.   Abortion.   Tubal pregnancy.   Infection passed on to the newborn.   Cancer.  CAUSES   Monilia. This is a yeast or fungus infection, not an STD.   Bacterial vaginosis. The normal balance of bacteria in the vagina is disrupted and is replaced by an overgrowth of certain bacteria.   Gonorrhea, chlamydia. These are bacterial infections that are STDs.   Vaginal sponges, diaphragms, and intrauterine devices.   Trichomoniasis. This is a STD infection caused by a parasite.   Viruses like herpes and human papillomavirus. Both are STDs.   Pregnancy.   Immunosuppression. This occurs with certain conditions such as HIV infection or cancer.   Using bubble bath.   Taking certain antibiotic medicines.   Sporadic recurrence can occur if you become sick.   Diabetes.   Steroids.   Allergic reaction. If you have an allergy to:   Douches.   Soaps.   Spermicides.   Condoms.   Scented tampons or vaginal sprays.  SYMPTOMS   Abnormal vaginal discharge.   Itching of the vagina.   Pain in the vagina.   Swelling of the vagina.  In some cases, there are no symptoms. TREATMENT  Treatment will vary depending on the type of infection.  Bacteria or trichomonas are usually treated with oral antibiotics and sometimes vaginal cream or suppositories.   Monilia vaginitis is usually treated with vaginal creams, suppositories, or oral antifungal pills.   Viral vaginitis has no cure. However, the symptoms of herpes (a viral vaginitis) can be treated to relieve the discomfort. Human papillomavirus has no symptoms. However, there are treatments for the diseases caused by human  papillomavirus.   With allergic vaginitis, you need to stop using the product that is causing the problem. Vaginal creams can be used to treat the symptoms.   When treating an STD, the sex partner should also be treated.  HOME CARE INSTRUCTIONS   Take all the medicines as directed by your caregiver.   Do not use scented tampons, soaps, or vaginal sprays.   Do not douche.   Tell your sex partner if you have a vaginal infection or an STD.   Do not have sexual intercourse until you have treated the vaginitis.   Practice safe sex by using condoms.  SEEK MEDICAL CARE IF:   You have abdominal pain.   Your symptoms get worse during treatment.  Document Released: 09/12/2007 Document Revised: 11/04/2011 Document Reviewed: 05/08/2009 [facility] Patient Information 2012 [place], [place].Hypertension As your heart beats, it forces blood through your arteries. This force is your blood pressure. If the pressure is too high, it is called hypertension (HTN) or high blood pressure. HTN is dangerous because you may have it and not know it. High blood pressure may mean that your heart has to work harder to pump blood. Your arteries may be narrow or stiff. The extra work puts you at risk for heart disease, stroke, and other problems.  Blood pressure consists of two numbers, a higher number over a lower, 110/72, for example. It is stated as "110 over 72." The ideal is below 120 for the top number (systolic) and under 80 for the bottom (diastolic). Write down your blood pressure today. You should pay  close attention to your blood pressure if you have certain conditions such as:  Heart failure.   Prior heart attack.   Diabetes   Chronic kidney disease.   Prior stroke.   Multiple risk factors for heart disease.  To see if you have HTN, your blood pressure should be measured while you are seated with your arm held at the level of the heart. It should be measured at least twice. A one-time elevated blood  pressure reading (especially in the Emergency Department) does not mean that you need treatment. There may be conditions in which the blood pressure is different between your right and left arms. It is important to see your caregiver soon for a recheck. Most people have essential hypertension which means that there is not a specific cause. This type of high blood pressure may be lowered by changing lifestyle factors such as:  Stress.   Smoking.   Lack of exercise.   Excessive weight.   Drug/tobacco/alcohol use.   Eating less salt.  Most people do not have symptoms from high blood pressure until it has caused damage to the body. Effective treatment can often prevent, delay or reduce that damage. TREATMENT  When a cause has been identified, treatment for high blood pressure is directed at the cause. There are a large number of medications to treat HTN. These fall into several categories, and your caregiver will help you select the medicines that are best for you. Medications may have side effects. You should review side effects with your caregiver. If your blood pressure stays high after you have made lifestyle changes or started on medicines,   Your medication(s) may need to be changed.   Other problems may need to be addressed.   Be certain you understand your prescriptions, and know how and when to take your medicine.   Be sure to follow up with your caregiver within the time frame advised (usually within two weeks) to have your blood pressure rechecked and to review your medications.   If you are taking more than one medicine to lower your blood pressure, make sure you know how and at what times they should be taken. Taking two medicines at the same time can result in blood pressure that is too low.  SEEK IMMEDIATE MEDICAL CARE IF:  You develop a severe headache, blurred or changing vision, or confusion.   You have unusual weakness or numbness, or a faint feeling.   You have  severe chest or abdominal pain, vomiting, or breathing problems.  MAKE SURE YOU:   Understand these instructions.   Will watch your condition.   Will get help right away if you are not doing well or get worse.  Document Released: 11/15/2005 Document Revised: 11/04/2011 Document Reviewed: 07/05/2008 [facility] Patient Information 2012 [place], [place].

## 2012-03-03 NOTE — Progress Notes (Signed)
Subjective:    Patient ID: Faith Sellers, female    DOB: Dec 12, 1975, 36 y.o.   MRN: [id number]  Vaginal Discharge The patient's primary symptoms include genital itching and a vaginal discharge. The patient's pertinent negatives include no genital lesions, genital odor, genital rash, missed menses, pelvic pain or vaginal bleeding. This is a recurrent problem. The current episode started 1 to 4 weeks ago. The problem occurs constantly. The problem has been gradually worsening. The patient is experiencing no pain. She is not pregnant. Pertinent negatives include no abdominal pain, anorexia, back pain, chills, constipation, diarrhea, discolored urine, dysuria, fever, flank pain, frequency, headaches, hematuria, joint pain, joint swelling, nausea, painful intercourse, rash, sore throat, urgency or vomiting. The vaginal discharge was green, mucopurulent and thick. There has been no bleeding. She has not been passing clots. She has not been passing tissue. She has tried antibiotics and antifungals for the symptoms. The treatment provided no relief. She is sexually active. No, her partner does not have an STD. She uses condoms for contraception. Her menstrual history has been regular.  Hypertension This is a chronic problem. The current episode started more than 1 year ago. The problem has been gradually improving since onset. Pertinent negatives include no anxiety, blurred vision, chest pain, headaches, malaise/fatigue, neck pain, orthopnea, palpitations, peripheral edema, PND, shortness of breath or sweats. Past treatments include ACE inhibitors and diuretics. There are no compliance problems.       Review of Systems  Constitutional: Negative for fever, chills, malaise/fatigue, diaphoresis, activity change, appetite change, fatigue and unexpected weight change.  HENT: Negative.  Negative for sore throat and neck pain.   Eyes: Negative.  Negative for blurred vision.  Respiratory: Negative for apnea, cough,  choking, chest tightness, shortness of breath and stridor.   Cardiovascular: Negative for chest pain, palpitations, orthopnea, leg swelling and PND.  Gastrointestinal: Negative for nausea, vomiting, abdominal pain, diarrhea, constipation, blood in stool, abdominal distention, anal bleeding, rectal pain and anorexia.  Genitourinary: Positive for vaginal discharge. Negative for dysuria, urgency, frequency, hematuria, flank pain, decreased urine volume, vaginal bleeding, enuresis, difficulty urinating, vaginal pain, pelvic pain, dyspareunia and missed menses.  Musculoskeletal: Negative for myalgias, back pain, joint pain, joint swelling and gait problem.  Skin: Negative for color change, pallor, rash and wound.  Neurological: Negative.  Negative for headaches.  Hematological: Negative for adenopathy. Does not bruise/bleed easily.  Psychiatric/Behavioral: Positive for sleep disturbance and dysphoric mood (sad about her sister begin ill with memory loss caused by topamax). Negative for suicidal ideas, behavioral problems, confusion, self-injury, decreased concentration and agitation. The patient is nervous/anxious.        Objective:   Physical Exam  Vitals reviewed. Constitutional: She is oriented to person, place, and time. She appears well-developed and well-nourished. No distress.  HENT:  Head: Normocephalic.  Mouth/Throat: Oropharynx is clear and moist. No oropharyngeal exudate.  Eyes: Conjunctivae are normal. Right eye exhibits no discharge. Left eye exhibits no discharge. No scleral icterus.  Neck: Normal range of motion. Neck supple. No JVD present. No tracheal deviation present. No thyromegaly present.  Cardiovascular: Normal rate, regular rhythm, normal heart sounds and intact distal pulses.  Exam reveals no gallop and no friction rub.   No murmur heard. Pulmonary/Chest: Effort normal and breath sounds normal. No stridor. No respiratory distress. She has no wheezes. She has no rales. She  exhibits no tenderness.  Abdominal: Soft. Bowel sounds are normal. She exhibits no distension and no mass. There is no tenderness. There is no rebound  and no guarding. Hernia confirmed negative in the right inguinal area and confirmed negative in the left inguinal area.  Genitourinary: No breast swelling, tenderness, discharge or bleeding. Pelvic exam was performed with patient supine. No erythema, tenderness or bleeding around the vagina. No foreign body around the vagina. No signs of injury around the vagina. Vaginal discharge found.       Discharge is of a moderate amount and it is thick and mucopurulent  Musculoskeletal: Normal range of motion. She exhibits no edema and no tenderness.  Lymphadenopathy:    She has no cervical adenopathy.       Right: No inguinal adenopathy present.       Left: No inguinal adenopathy present.  Neurological: She is oriented to person, place, and time.  Skin: Skin is warm and dry. No rash noted. She is not diaphoretic. No erythema. No pallor.  Psychiatric: She has a normal mood and affect. Her behavior is normal. Judgment and thought content normal.      Lab Results  Component Value Date   WBC 12.7* 10/25/2011   HGB 12.0 10/25/2011   HCT 36.1 10/25/2011   PLT 278 10/25/2011   GLUCOSE 107* 10/25/2011   ALT 15 10/25/2011   AST 16 10/25/2011   NA 137 10/25/2011   K 3.7 10/25/2011   CL 100 10/25/2011   CREATININE 0.60 10/25/2011   BUN 14 10/25/2011   CO2 28 10/25/2011   TSH 0.987 02/15/2012   INR 0.96 08/28/2011      Assessment & Plan:

## 2012-03-04 ENCOUNTER — Telehealth: Payer: Self-pay

## 2012-03-04 NOTE — Telephone Encounter (Signed)
Pt called and states she was expecting a call from Dr. Yetta Barre regarding lab results.  Pt states a medication was suppose to be called in to her pharmacy.

## 2012-03-05 ENCOUNTER — Encounter: Payer: Self-pay | Admitting: Internal Medicine

## 2012-03-05 MED ORDER — FLUCONAZOLE 150 MG PO TABS: 150.0000 mg | ORAL_TABLET | Freq: Once | ORAL | Status: AC

## 2012-03-05 MED ORDER — METRONIDAZOLE 250 MG PO TABS: 250.0000 mg | ORAL_TABLET | Freq: Two times a day (BID) | ORAL | Status: AC

## 2012-03-05 NOTE — Assessment & Plan Note (Signed)
I will recheck her CBC today 

## 2012-03-05 NOTE — Assessment & Plan Note (Signed)
Her wet prep is + for clue cells so I have ordered flagyl and she wants to take diflucan as well to prevent yeast infection

## 2012-03-05 NOTE — Progress Notes (Signed)
Addended by: Etta Grandchild on: 03/05/2012 01:46 PM   Modules accepted: Orders

## 2012-03-05 NOTE — Assessment & Plan Note (Signed)
Her BP is well controlled, today I will check her lytes and renal function 

## 2012-03-05 NOTE — Assessment & Plan Note (Signed)
I will check her a1c today 

## 2012-03-05 NOTE — Telephone Encounter (Signed)
done

## 2012-03-05 NOTE — Assessment & Plan Note (Signed)
She can continue ambien as needed 

## 2012-03-05 NOTE — Assessment & Plan Note (Signed)
I will check her FLP today 

## 2012-03-09 ENCOUNTER — Encounter: Payer: Self-pay | Admitting: Internal Medicine

## 2012-03-21 ENCOUNTER — Telehealth: Payer: Self-pay | Admitting: *Deleted

## 2012-03-21 DIAGNOSIS — D649 Anemia, unspecified: Secondary | ICD-10-CM

## 2012-03-21 NOTE — Telephone Encounter (Signed)
Pt states received letter in mail stating she was anemic and that she cannot take OTC Iron supplement; request for Rx. Please advise.

## 2012-03-22 NOTE — Telephone Encounter (Signed)
She did not have an iron level done and the letter only said that she is anemic, she needs to come in to have an iron level done

## 2012-03-24 NOTE — Telephone Encounter (Signed)
Returned call lmovm advising per MD

## 2012-03-24 NOTE — Telephone Encounter (Signed)
Addended by: Rock Nephew T on: 03/24/2012 02:55 PM   Modules accepted: Orders

## 2012-04-18 ENCOUNTER — Telehealth: Payer: Self-pay

## 2012-04-18 DIAGNOSIS — K219 Gastro-esophageal reflux disease without esophagitis: Secondary | ICD-10-CM

## 2012-04-18 NOTE — Telephone Encounter (Signed)
[facility] stating that dexilant cost $155.18. Alternatives are lansoprazole 15 mg, 30 mg $9.00. Omeprazole 10 mg or 20 mg $9.00 and pantoprazole 20 mg or 40 mg $9.00. Please advise if you would like to change. Thanks

## 2012-04-19 MED ORDER — PANTOPRAZOLE SODIUM 40 MG PO TBEC: 40.0000 mg | DELAYED_RELEASE_TABLET | Freq: Every day | ORAL | Status: DC

## 2012-04-19 NOTE — Telephone Encounter (Signed)
done

## 2012-06-15 ENCOUNTER — Encounter: Payer: BC Managed Care – PPO | Admitting: Women's Health

## 2012-06-19 ENCOUNTER — Telehealth: Payer: Self-pay

## 2012-06-19 DIAGNOSIS — G4700 Insomnia, unspecified: Secondary | ICD-10-CM

## 2012-06-19 MED ORDER — ZOLPIDEM TARTRATE 10 MG PO TABS: 10.0000 mg | ORAL_TABLET | Freq: Every evening | ORAL | Status: DC | PRN

## 2012-06-19 NOTE — Telephone Encounter (Signed)
Please advise if ok to refill ambien 10 mg. Patient last seen 02/2012 and medication was filled at that time #30/3 rf Thanks

## 2012-06-19 NOTE — Telephone Encounter (Signed)
ok 

## 2012-06-22 ENCOUNTER — Encounter: Payer: BC Managed Care – PPO | Admitting: Internal Medicine

## 2012-06-22 ENCOUNTER — Encounter: Payer: BC Managed Care – PPO | Admitting: Women's Health

## 2012-06-22 DIAGNOSIS — Z0289 Encounter for other administrative examinations: Secondary | ICD-10-CM

## 2012-10-11 ENCOUNTER — Ambulatory Visit (INDEPENDENT_AMBULATORY_CARE_PROVIDER_SITE_OTHER): Payer: BC Managed Care – PPO | Admitting: Women's Health

## 2012-10-11 ENCOUNTER — Encounter: Payer: Self-pay | Admitting: Women's Health

## 2012-10-11 VITALS — BP 142/82 | Ht 65.75 in | Wt 212.0 lb

## 2012-10-11 DIAGNOSIS — Z113 Encounter for screening for infections with a predominantly sexual mode of transmission: Secondary | ICD-10-CM

## 2012-10-11 DIAGNOSIS — Z01419 Encounter for gynecological examination (general) (routine) without abnormal findings: Secondary | ICD-10-CM

## 2012-10-11 DIAGNOSIS — I10 Essential (primary) hypertension: Secondary | ICD-10-CM

## 2012-10-11 LAB — HIV ANTIBODY (ROUTINE TESTING W REFLEX): HIV: NONREACTIVE

## 2012-10-11 LAB — HEPATITIS C ANTIBODY: HCV Ab: NEGATIVE

## 2012-10-11 LAB — HEPATITIS B SURFACE ANTIGEN: Hepatitis B Surface Ag: NEGATIVE

## 2012-10-11 MED ORDER — LISINOPRIL-HYDROCHLOROTHIAZIDE 20-25 MG PO TABS: 1.0000 | ORAL_TABLET | Freq: Every day | ORAL | Status: DC

## 2012-10-11 NOTE — Patient Instructions (Signed)

## 2012-10-11 NOTE — Progress Notes (Signed)
Faith Sellers 02/13/1976 [id number]    History:    The patient presents for annual exam.  Monthly 6 day cycles/condoms/same partner. History of normal Paps, normal Pap 2013. Hypertension/hypercholesterolemia treated at primary care.   Past medical history, past surgical history, family history and social history were all reviewed and documented in the EPIC chart. Works at [employer], and sons [place] and Justin,10 doing well.   ROS:  A  ROS was performed and pertinent positives and negatives are included in the history.  Exam:  Filed Vitals:   10/11/12 0826  BP: 142/82    General appearance:  Normal Head/Neck:  Normal, without cervical or supraclavicular adenopathy. Thyroid:  Symmetrical, normal in size, without palpable masses or nodularity. Respiratory  Effort:  Normal  Auscultation:  Clear without wheezing or rhonchi Cardiovascular  Auscultation:  Regular rate, without rubs, murmurs or gallops  Edema/varicosities:  Not grossly evident Abdominal  Soft,nontender, without masses, guarding or rebound.  Liver/spleen:  No organomegaly noted  Hernia:  None appreciated  Skin  Inspection:  Grossly normal  Palpation:  Grossly normal Neurologic/psychiatric  Orientation:  Normal with appropriate conversation.  Mood/affect:  Normal  Genitourinary    Breasts: Examined lying and sitting.     Right: Without masses, retractions, discharge or axillary adenopathy.     Left: Without masses, retractions, discharge or axillary adenopathy.   Inguinal/mons:  Normal without inguinal adenopathy  External genitalia:  Normal  BUS/Urethra/Skene's glands:  Normal  Bladder:  Normal  Vagina:  Normal scant menses  Cervix:  Normal  Uterus:   normal in size, shape and contour.  Midline and mobile  Adnexa/parametria:     Rt: Without masses or tenderness.   Lt: Without masses or tenderness.  Anus and perineum: Normal  Digital rectal exam: Normal sphincter tone without palpated masses or  tenderness  Assessment/Plan:  36 y.o. SBF G2 P2 for annual exam with no complaints.  Normal GYN exam/condoms STD screen Hypertension/hypercholesterolemia-primary care labs and meds Obesity  Plan: One month prescription for lisinopril given and instructed to schedule followup with primary care.(out of Rx, last OV 02/2012)  SBE's, continue regular exercise and decrease calories for weight loss, has lost approximately 20 pounds in the past year. Calcium rich diet, vitamin D 1000 daily encouraged. Pap normal 2013 at primary care. New screening guidelines reviewed. Had a normal GC/Chlamydia March 2013 with same partner, HIV, RPR, hepatitis B, C.. contraception options reviewed and declined will continue with condoms.     Harrington Challenger [facility], 9:32 AM 10/11/2012

## 2012-12-01 ENCOUNTER — Ambulatory Visit (INDEPENDENT_AMBULATORY_CARE_PROVIDER_SITE_OTHER): Payer: BC Managed Care – PPO | Admitting: Internal Medicine

## 2012-12-01 ENCOUNTER — Other Ambulatory Visit (INDEPENDENT_AMBULATORY_CARE_PROVIDER_SITE_OTHER): Payer: BC Managed Care – PPO

## 2012-12-01 ENCOUNTER — Encounter: Payer: Self-pay | Admitting: Internal Medicine

## 2012-12-01 VITALS — BP 120/74 | HR 78 | Temp 98.2°F | Resp 16 | Ht 65.0 in | Wt 216.0 lb

## 2012-12-01 DIAGNOSIS — R7309 Other abnormal glucose: Secondary | ICD-10-CM

## 2012-12-01 DIAGNOSIS — G4700 Insomnia, unspecified: Secondary | ICD-10-CM

## 2012-12-01 DIAGNOSIS — L301 Dyshidrosis [pompholyx]: Secondary | ICD-10-CM

## 2012-12-01 DIAGNOSIS — F341 Dysthymic disorder: Secondary | ICD-10-CM

## 2012-12-01 DIAGNOSIS — F418 Other specified anxiety disorders: Secondary | ICD-10-CM | POA: Insufficient documentation

## 2012-12-01 DIAGNOSIS — I10 Essential (primary) hypertension: Secondary | ICD-10-CM

## 2012-12-01 LAB — BASIC METABOLIC PANEL
BUN: 15 mg/dL (ref 6–23)
Calcium: 9.5 mg/dL (ref 8.4–10.5)
Creatinine, Ser: 0.7 mg/dL (ref 0.4–1.2)
GFR: 117.11 mL/min (ref >60.00)
Potassium: 4.5 mEq/L (ref 3.5–5.1)

## 2012-12-01 LAB — HEMOGLOBIN A1C: Hgb A1c MFr Bld: 6.1 % (ref 4.6–6.5)

## 2012-12-01 MED ORDER — TRIAMCINOLONE ACETONIDE 0.5 % EX CREA: TOPICAL_CREAM | Freq: Three times a day (TID) | CUTANEOUS | Status: DC

## 2012-12-01 MED ORDER — ZOLPIDEM TARTRATE 10 MG PO TABS: 10.0000 mg | ORAL_TABLET | Freq: Every evening | ORAL | Status: DC | PRN

## 2012-12-01 MED ORDER — SERTRALINE HCL 50 MG PO TABS: 50.0000 mg | ORAL_TABLET | Freq: Every day | ORAL | Status: DC

## 2012-12-01 NOTE — Progress Notes (Signed)
Subjective:    Patient ID: Faith Sellers, female    DOB: 16-Aug-1976, 37 y.o.   MRN: [id number]  Rash This is a recurrent problem. The current episode started more than 1 year ago. The problem is unchanged. The affected locations include the left lower leg and right lower leg. The rash is characterized by itchiness and dryness. She was exposed to nothing. Pertinent negatives include no anorexia, congestion, cough, diarrhea, eye pain, facial edema, fatigue, fever, joint pain, nail changes, rhinorrhea, shortness of breath, sore throat or vomiting. Past treatments include nothing. The treatment provided no relief.  Hypertension This is a chronic problem. The current episode started more than 1 year ago. The problem has been gradually improving since onset. The problem is controlled. Associated symptoms include anxiety. Pertinent negatives include no blurred vision, chest pain, headaches, malaise/fatigue, neck pain, orthopnea, palpitations, peripheral edema, PND, shortness of breath or sweats. Past treatments include ACE inhibitors and diuretics. The current treatment provides significant improvement. Compliance problems include exercise and diet.       Review of Systems  Constitutional: Negative for fever, chills, malaise/fatigue, diaphoresis, activity change, appetite change, fatigue and unexpected weight change.  HENT: Negative.  Negative for congestion, sore throat, rhinorrhea and neck pain.   Eyes: Negative.  Negative for blurred vision and pain.  Respiratory: Negative.  Negative for cough and shortness of breath.   Cardiovascular: Negative.  Negative for chest pain, palpitations, orthopnea and PND.  Gastrointestinal: Negative.  Negative for vomiting, diarrhea and anorexia.  Genitourinary: Negative.   Musculoskeletal: Negative for joint pain.  Skin: Positive for rash. Negative for nail changes, color change, pallor and wound.  Neurological: Negative.  Negative for headaches.  Hematological:  Negative for adenopathy. Does not bruise/bleed easily.  Psychiatric/Behavioral: Positive for sleep disturbance and dysphoric mood. Negative for suicidal ideas, hallucinations, behavioral problems, confusion, self-injury, decreased concentration and agitation. The patient is nervous/anxious. The patient is not hyperactive.        Objective:   Physical Exam  Vitals reviewed. Constitutional: She is oriented to person, place, and time. She appears well-developed and well-nourished. No distress.  HENT:  Head: Normocephalic and atraumatic.  Mouth/Throat: Oropharynx is clear and moist. No oropharyngeal exudate.  Eyes: Conjunctivae normal are normal. Right eye exhibits no discharge. Left eye exhibits no discharge. No scleral icterus.  Neck: Normal range of motion. Neck supple. No JVD present. No tracheal deviation present. No thyromegaly present.  Cardiovascular: Normal rate, regular rhythm, normal heart sounds and intact distal pulses.  Exam reveals no gallop and no friction rub.   No murmur heard. Pulmonary/Chest: Effort normal and breath sounds normal. No stridor. No respiratory distress. She has no wheezes. She has no rales. She exhibits no tenderness.  Abdominal: Soft. Bowel sounds are normal. She exhibits no distension and no mass. There is no tenderness. There is no rebound and no guarding.  Musculoskeletal: Normal range of motion. She exhibits no edema and no tenderness.  Lymphadenopathy:    She has no cervical adenopathy.  Neurological: She is oriented to person, place, and time.  Skin: Skin is warm, dry and intact. Rash noted. No purpura noted. Rash is papular. Rash is not macular, not maculopapular, not nodular, not pustular, not vesicular and not urticarial. She is not diaphoretic. No cyanosis. No pallor. Nails show no clubbing.          On both LE's there is xerosis, scale, lichenification in patches  Psychiatric: She has a normal mood and affect. Her behavior is normal. Judgment and  thought content normal.     Lab Results  Component Value Date   WBC 9.7 03/03/2012   HGB 11.0* 03/03/2012   HCT 34.2* 03/03/2012   PLT 369.0 03/03/2012   GLUCOSE 101* 03/03/2012   CHOL 163 03/03/2012   TRIG 127.0 03/03/2012   HDL 32.50* 03/03/2012   LDLCALC 105* 03/03/2012   ALT 18 03/03/2012   AST 22 03/03/2012   NA 145 03/03/2012   K 3.7 03/03/2012   CL 107 03/03/2012   CREATININE 0.8 03/03/2012   BUN 12 03/03/2012   CO2 27 03/03/2012   TSH 0.66 03/03/2012   INR 0.96 08/28/2011   HGBA1C 6.2 03/03/2012       Assessment & Plan:

## 2012-12-01 NOTE — Assessment & Plan Note (Signed)
Her BP is well controlled Today I will check her lytes and renal function 

## 2012-12-01 NOTE — Patient Instructions (Signed)

## 2012-12-01 NOTE — Assessment & Plan Note (Signed)
Start zoloft

## 2012-12-01 NOTE — Assessment & Plan Note (Signed)
I will continue to monitor her a1c and if it gets above 7 will start treatment She will continue to work on her lifestyle modifications

## 2012-12-01 NOTE — Assessment & Plan Note (Signed)
Start zoloft Continue ambien as needed

## 2012-12-01 NOTE — Assessment & Plan Note (Signed)
Treat with TAC cream 

## 2012-12-18 ENCOUNTER — Telehealth: Payer: Self-pay | Admitting: *Deleted

## 2012-12-18 MED ORDER — TRANEXAMIC ACID 650 MG PO TABS: 1300.0000 mg | ORAL_TABLET | Freq: Three times a day (TID) | ORAL | Status: DC

## 2012-12-18 NOTE — Telephone Encounter (Signed)
Okay to refill? 

## 2012-12-18 NOTE — Telephone Encounter (Signed)
Pt called requesting refill on lysteda. Okay to fill? This rx was noted on Tf OV on 02/15/12

## 2012-12-18 NOTE — Telephone Encounter (Signed)
rx sent, left on pt voicemail rx sent.

## 2012-12-28 ENCOUNTER — Encounter: Payer: Self-pay | Admitting: Internal Medicine

## 2013-02-07 LAB — HM PAP SMEAR: HM PAP: NORMAL

## 2013-02-15 ENCOUNTER — Encounter: Payer: Self-pay | Admitting: Internal Medicine

## 2013-02-15 MED ORDER — LISINOPRIL-HYDROCHLOROTHIAZIDE 20-25 MG PO TABS: 1.0000 | ORAL_TABLET | Freq: Every day | ORAL | Status: DC

## 2013-04-04 ENCOUNTER — Ambulatory Visit: Payer: BC Managed Care – PPO | Admitting: Internal Medicine

## 2013-04-18 ENCOUNTER — Other Ambulatory Visit: Payer: Self-pay | Admitting: Women's Health

## 2013-04-18 MED ORDER — TRANEXAMIC ACID 650 MG PO TABS: ORAL_TABLET | ORAL | Status: DC

## 2013-04-26 ENCOUNTER — Telehealth: Payer: Self-pay | Admitting: Internal Medicine

## 2013-04-26 NOTE — Telephone Encounter (Signed)
I don't see any info about linzess or constipation - please have her f/up with me

## 2013-04-26 NOTE — Telephone Encounter (Signed)
Pt made an appt for June 5 to follow up on constipation.

## 2013-04-26 NOTE — Telephone Encounter (Signed)
Requesting a refill on Ambien and also RX for Linzess for constipation.

## 2013-04-30 ENCOUNTER — Encounter: Payer: Self-pay | Admitting: Internal Medicine

## 2013-04-30 ENCOUNTER — Other Ambulatory Visit: Payer: Self-pay | Admitting: Internal Medicine

## 2013-04-30 DIAGNOSIS — G4700 Insomnia, unspecified: Secondary | ICD-10-CM

## 2013-04-30 MED ORDER — ZOLPIDEM TARTRATE 10 MG PO TABS: 10.0000 mg | ORAL_TABLET | Freq: Every evening | ORAL | Status: DC | PRN

## 2013-05-03 ENCOUNTER — Ambulatory Visit: Payer: BC Managed Care – PPO | Admitting: Internal Medicine

## 2013-05-11 ENCOUNTER — Encounter: Payer: Self-pay | Admitting: Women's Health

## 2013-05-11 ENCOUNTER — Telehealth: Payer: Self-pay

## 2013-05-11 ENCOUNTER — Ambulatory Visit: Payer: BC Managed Care – PPO | Admitting: Internal Medicine

## 2013-05-11 NOTE — Telephone Encounter (Signed)
Pt informed with the below note, appointment desk will call to make OV.

## 2013-05-11 NOTE — Telephone Encounter (Signed)
Patient emailed through my chart: "I have a fishy order but I'm not experiencing any discomfort or dishchcharge. Is it possible to call me in something?"    I emailed her back: " We do not prescribed for this over the phone. We recommend an office visit to diagnose and make sure we get you on the right Rx. Please call the office for an appointment. [phone] prompt 4."  She emailed back and said she would make an appointment but would I have Faith Sellers call her at her earliest convenience.  (NO appointment has been scheduled.)

## 2013-05-11 NOTE — Telephone Encounter (Signed)
Please call, office visit best, we could fit her in this afternoon at 4:30.

## 2013-05-16 ENCOUNTER — Ambulatory Visit: Payer: Self-pay | Admitting: Women's Health

## 2013-05-24 ENCOUNTER — Encounter: Payer: Self-pay | Admitting: Internal Medicine

## 2013-05-24 ENCOUNTER — Ambulatory Visit (INDEPENDENT_AMBULATORY_CARE_PROVIDER_SITE_OTHER): Payer: BC Managed Care – PPO | Admitting: Internal Medicine

## 2013-05-24 VITALS — BP 114/70 | HR 72 | Temp 98.2°F | Resp 16 | Wt 223.0 lb

## 2013-05-24 DIAGNOSIS — I10 Essential (primary) hypertension: Secondary | ICD-10-CM

## 2013-05-24 DIAGNOSIS — K5909 Other constipation: Secondary | ICD-10-CM

## 2013-05-24 DIAGNOSIS — G4700 Insomnia, unspecified: Secondary | ICD-10-CM

## 2013-05-24 DIAGNOSIS — IMO0001 Reserved for inherently not codable concepts without codable children: Secondary | ICD-10-CM

## 2013-05-24 DIAGNOSIS — K5900 Constipation, unspecified: Secondary | ICD-10-CM

## 2013-05-24 MED ORDER — LORCASERIN HCL 10 MG PO TABS: 1.0000 | ORAL_TABLET | Freq: Two times a day (BID) | ORAL | Status: DC

## 2013-05-24 MED ORDER — ZOLPIDEM TARTRATE 10 MG PO TABS: 10.0000 mg | ORAL_TABLET | Freq: Every evening | ORAL | Status: DC | PRN

## 2013-05-24 MED ORDER — LINACLOTIDE 145 MCG PO CAPS: 145.0000 ug | ORAL_CAPSULE | Freq: Every day | ORAL | Status: DC

## 2013-05-24 NOTE — Assessment & Plan Note (Signed)
Continue ambien as needed.

## 2013-05-24 NOTE — Assessment & Plan Note (Signed)
She has decided to stop the meds She will monitor her BP and let me know if the BP rises again

## 2013-05-24 NOTE — Assessment & Plan Note (Signed)
She will continue taking linzess

## 2013-05-24 NOTE — Patient Instructions (Signed)

## 2013-05-24 NOTE — Progress Notes (Signed)
Subjective:    Patient ID: Faith Sellers, female    DOB: 04-Feb-1976, 37 y.o.   MRN: [id number]  Constipation This is a chronic problem. The current episode started more than 1 year ago. The problem is unchanged. The stool is described as firm and formed. The patient is not on a high fiber diet. She exercises regularly. There has been adequate water intake. Pertinent negatives include no abdominal pain, anorexia, back pain, bloating, diarrhea, difficulty urinating, fecal incontinence, fever, flatus, hematochezia, hemorrhoids, melena, nausea, rectal pain, vomiting or weight loss. Risk factors include obesity and stress. Treatments tried: linzess. The treatment provided significant relief. There is no history of abdominal surgery, endocrine disease, inflammatory bowel disease, irritable bowel syndrome, metabolic disease, neurologic disease, neuromuscular disease, psychiatric history or radiation treatment.      Review of Systems  Constitutional: Positive for unexpected weight change (weight gain). Negative for fever, chills, weight loss, diaphoresis, activity change, appetite change and fatigue.  Eyes: Negative.   Respiratory: Negative.  Negative for cough, chest tightness, shortness of breath, wheezing and stridor.   Cardiovascular: Negative.  Negative for chest pain, palpitations and leg swelling.  Gastrointestinal: Positive for constipation. Negative for nausea, vomiting, abdominal pain, diarrhea, blood in stool, melena, hematochezia, abdominal distention, anal bleeding, rectal pain, bloating, anorexia, flatus and hemorrhoids.  Endocrine: Negative.  Negative for polydipsia, polyphagia and polyuria.  Genitourinary: Negative.  Negative for difficulty urinating.  Musculoskeletal: Negative.  Negative for myalgias, back pain, joint swelling, arthralgias and gait problem.  Skin: Negative.   Allergic/Immunologic: Negative.   Neurological: Negative.   Hematological: Negative.  Negative for adenopathy.  Does not bruise/bleed easily.  Psychiatric/Behavioral: Positive for sleep disturbance. Negative for suicidal ideas, hallucinations, behavioral problems, confusion, self-injury, dysphoric mood, decreased concentration and agitation. The patient is not nervous/anxious and is not hyperactive.        Objective:   Physical Exam  Vitals reviewed. Constitutional: She is oriented to person, place, and time. She appears well-developed and well-nourished. No distress.  HENT:  Head: Normocephalic and atraumatic.  Mouth/Throat: Oropharynx is clear and moist. No oropharyngeal exudate.  Eyes: Conjunctivae are normal. Right eye exhibits no discharge. Left eye exhibits no discharge. No scleral icterus.  Neck: Normal range of motion. Neck supple. No JVD present. No tracheal deviation present. No thyromegaly present.  Cardiovascular: Normal rate, regular rhythm, normal heart sounds and intact distal pulses.  Exam reveals no gallop and no friction rub.   No murmur heard. Pulmonary/Chest: Effort normal and breath sounds normal. No stridor. No respiratory distress. She has no wheezes. She has no rales. She exhibits no tenderness.  Abdominal: Soft. Bowel sounds are normal. She exhibits no distension and no mass. There is no tenderness. There is no rebound and no guarding.  Musculoskeletal: Normal range of motion. She exhibits no edema and no tenderness.  Lymphadenopathy:    She has no cervical adenopathy.  Neurological: She is oriented to person, place, and time.  Skin: Skin is warm and dry. No rash noted. She is not diaphoretic. No erythema. No pallor.  Psychiatric: She has a normal mood and affect. Her behavior is normal. Judgment and thought content normal.     Lab Results  Component Value Date   WBC 9.7 03/03/2012   HGB 11.0* 03/03/2012   HCT 34.2* 03/03/2012   PLT 369.0 03/03/2012   GLUCOSE 100* 12/01/2012   CHOL 163 03/03/2012   TRIG 127.0 03/03/2012   HDL 32.50* 03/03/2012   LDLCALC 105* 03/03/2012   ALT 18  03/03/2012  AST 22 03/03/2012   NA 138 12/01/2012   K 4.5 12/01/2012   CL 100 12/01/2012   CREATININE 0.7 12/01/2012   BUN 15 12/01/2012   CO2 30 12/01/2012   TSH 0.66 03/03/2012   INR 0.96 08/28/2011   HGBA1C 6.1 12/01/2012       Assessment & Plan:

## 2013-05-24 NOTE — Assessment & Plan Note (Signed)
In addition to her lifestyle modifications, I have asked her to try belviq

## 2013-05-28 ENCOUNTER — Other Ambulatory Visit: Payer: Self-pay | Admitting: Internal Medicine

## 2013-05-28 ENCOUNTER — Encounter: Payer: Self-pay | Admitting: Internal Medicine

## 2013-05-28 DIAGNOSIS — N760 Acute vaginitis: Secondary | ICD-10-CM | POA: Insufficient documentation

## 2013-05-28 DIAGNOSIS — B9689 Other specified bacterial agents as the cause of diseases classified elsewhere: Secondary | ICD-10-CM | POA: Insufficient documentation

## 2013-05-28 MED ORDER — CLINDAMYCIN PHOSPHATE 2 % VA CREA: 1.0000 | TOPICAL_CREAM | Freq: Every day | VAGINAL | Status: DC

## 2013-05-29 ENCOUNTER — Encounter: Payer: Self-pay | Admitting: Internal Medicine

## 2013-06-25 ENCOUNTER — Encounter: Payer: BC Managed Care – PPO | Admitting: Internal Medicine

## 2013-07-02 ENCOUNTER — Other Ambulatory Visit: Payer: Self-pay | Admitting: Internal Medicine

## 2013-07-02 ENCOUNTER — Encounter: Payer: Self-pay | Admitting: Internal Medicine

## 2013-07-02 DIAGNOSIS — IMO0001 Reserved for inherently not codable concepts without codable children: Secondary | ICD-10-CM

## 2013-07-02 MED ORDER — PHENTERMINE HCL 37.5 MG PO CAPS: 37.5000 mg | ORAL_CAPSULE | ORAL | Status: DC

## 2013-07-04 ENCOUNTER — Encounter: Payer: BC Managed Care – PPO | Admitting: Internal Medicine

## 2013-07-12 ENCOUNTER — Encounter: Payer: Self-pay | Admitting: Internal Medicine

## 2013-07-14 ENCOUNTER — Encounter: Payer: Self-pay | Admitting: Women's Health

## 2013-07-19 ENCOUNTER — Ambulatory Visit (INDEPENDENT_AMBULATORY_CARE_PROVIDER_SITE_OTHER): Payer: BC Managed Care – PPO | Admitting: Internal Medicine

## 2013-07-19 ENCOUNTER — Other Ambulatory Visit (INDEPENDENT_AMBULATORY_CARE_PROVIDER_SITE_OTHER): Payer: BC Managed Care – PPO

## 2013-07-19 ENCOUNTER — Encounter: Payer: Self-pay | Admitting: Internal Medicine

## 2013-07-19 VITALS — BP 134/70 | HR 98 | Temp 98.3°F | Resp 16 | Wt 221.0 lb

## 2013-07-19 DIAGNOSIS — E7800 Pure hypercholesterolemia, unspecified: Secondary | ICD-10-CM

## 2013-07-19 DIAGNOSIS — R7309 Other abnormal glucose: Secondary | ICD-10-CM

## 2013-07-19 DIAGNOSIS — IMO0001 Reserved for inherently not codable concepts without codable children: Secondary | ICD-10-CM

## 2013-07-19 DIAGNOSIS — I10 Essential (primary) hypertension: Secondary | ICD-10-CM

## 2013-07-19 DIAGNOSIS — Z0000 Encounter for general adult medical examination without abnormal findings: Secondary | ICD-10-CM

## 2013-07-19 LAB — CBC WITH DIFFERENTIAL/PLATELET
Basophils Absolute: 0 10*3/uL (ref 0.0–0.1)
Basophils Relative: 0.4 % (ref 0.0–3.0)
Eosinophils Absolute: 0.2 10*3/uL (ref 0.0–0.7)
MCHC: 32.5 g/dL (ref 30.0–36.0)
MCV: 74.4 fl — ABNORMAL LOW (ref 78.0–100.0)
Monocytes Absolute: 0.8 10*3/uL (ref 0.1–1.0)
Neutrophils Relative %: 46.7 % (ref 43.0–77.0)
Platelets: 341 10*3/uL (ref 150.0–400.0)
RBC: 5.13 Mil/uL — ABNORMAL HIGH (ref 3.87–5.11)
RDW: 15.8 % — ABNORMAL HIGH (ref 11.5–14.6)

## 2013-07-19 LAB — COMPREHENSIVE METABOLIC PANEL
ALT: 21 U/L (ref 0–35)
AST: 28 U/L (ref 0–37)
Albumin: 4 g/dL (ref 3.5–5.2)
Alkaline Phosphatase: 67 U/L (ref 39–117)
Chloride: 98 mEq/L (ref 96–112)
Potassium: 3.6 mEq/L (ref 3.5–5.1)
Sodium: 136 mEq/L (ref 135–145)
Total Protein: 8.2 g/dL (ref 6.0–8.3)

## 2013-07-19 LAB — HEMOGLOBIN A1C: Hgb A1c MFr Bld: 6.2 % (ref 4.6–6.5)

## 2013-07-19 LAB — LIPID PANEL
HDL: 32.3 mg/dL — ABNORMAL LOW (ref >39.00)
Total CHOL/HDL Ratio: 7
VLDL: 40.8 mg/dL — ABNORMAL HIGH (ref 0.0–40.0)

## 2013-07-19 LAB — URINALYSIS, ROUTINE W REFLEX MICROSCOPIC
Bilirubin Urine: NEGATIVE
Ketones, ur: NEGATIVE
Specific Gravity, Urine: 1.01 (ref 1.000–1.030)
Urine Glucose: NEGATIVE
pH: 6 (ref 5.0–8.0)

## 2013-07-19 NOTE — Patient Instructions (Signed)
Preventive Care for Adults, Female A healthy lifestyle and preventive care can promote health and wellness. Preventive health guidelines for women include the following key practices.  A routine yearly physical is a good way to check with your caregiver about your health and preventive screening. It is a chance to share any concerns and updates on your health, and to receive a thorough exam.  Visit your dentist for a routine exam and preventive care every 6 months. Brush your teeth twice a day and floss once a day. Good oral hygiene prevents tooth decay and gum disease.  The frequency of eye exams is based on your age, health, family medical history, use of contact lenses, and other factors. Follow your caregiver's recommendations for frequency of eye exams.  Eat a healthy diet. Foods like vegetables, fruits, whole grains, low-fat dairy products, and lean protein foods contain the nutrients you need without too many calories. Decrease your intake of foods high in solid fats, added sugars, and salt. Eat the right amount of calories for you.Get information about a proper diet from your caregiver, if necessary.  Regular physical exercise is one of the most important things you can do for your health. Most adults should get at least 150 minutes of moderate-intensity exercise (any activity that increases your heart rate and causes you to sweat) each week. In addition, most adults need muscle-strengthening exercises on 2 or more days a week.  Maintain a healthy weight. The body mass index (BMI) is a screening tool to identify possible weight problems. It provides an estimate of body fat based on height and weight. Your caregiver can help determine your BMI, and can help you achieve or maintain a healthy weight.For adults 20 years and older:  A BMI below 18.5 is considered underweight.  A BMI of 18.5 to 24.9 is normal.  A BMI of 25 to 29.9 is considered overweight.  A BMI of 30 and above is  considered obese.  Maintain normal blood lipids and cholesterol levels by exercising and minimizing your intake of saturated fat. Eat a balanced diet with plenty of fruit and vegetables. Blood tests for lipids and cholesterol should begin at age 20 and be repeated every 5 years. If your lipid or cholesterol levels are high, you are over 50, or you are at high risk for heart disease, you may need your cholesterol levels checked more frequently.Ongoing high lipid and cholesterol levels should be treated with medicines if diet and exercise are not effective.  If you smoke, find out from your caregiver how to quit. If you do not use tobacco, do not start.  If you are pregnant, do not drink alcohol. If you are breastfeeding, be very cautious about drinking alcohol. If you are not pregnant and choose to drink alcohol, do not exceed 1 drink per day. One drink is considered to be 12 ounces (355 mL) of beer, 5 ounces (148 mL) of wine, or 1.5 ounces (44 mL) of liquor.  Avoid use of street drugs. Do not share needles with anyone. Ask for help if you need support or instructions about stopping the use of drugs.  High blood pressure causes heart disease and increases the risk of stroke. Your blood pressure should be checked at least every 1 to 2 years. Ongoing high blood pressure should be treated with medicines if weight loss and exercise are not effective.  If you are 55 to 38 years old, ask your caregiver if you should take aspirin to prevent strokes.  Diabetes   screening involves taking a blood sample to check your fasting blood sugar level. This should be done once every 3 years, after age 45, if you are within normal weight and without risk factors for diabetes. Testing should be considered at a younger age or be carried out more frequently if you are overweight and have at least 1 risk factor for diabetes.  Breast cancer screening is essential preventive care for women. You should practice "breast  self-awareness." This means understanding the normal appearance and feel of your breasts and may include breast self-examination. Any changes detected, no matter how small, should be reported to a caregiver. Women in their 20s and 30s should have a clinical breast exam (CBE) by a caregiver as part of a regular health exam every 1 to 3 years. After age 40, women should have a CBE every year. Starting at age 40, women should consider having a mammography (breast X-ray test) every year. Women who have a family history of breast cancer should talk to their caregiver about genetic screening. Women at a high risk of breast cancer should talk to their caregivers about having magnetic resonance imaging (MRI) and a mammography every year.  The Pap test is a screening test for cervical cancer. A Pap test can show cell changes on the cervix that might become cervical cancer if left untreated. A Pap test is a procedure in which cells are obtained and examined from the lower end of the uterus (cervix).  Women should have a Pap test starting at age 21.  Between ages 21 and 29, Pap tests should be repeated every 2 years.  Beginning at age 30, you should have a Pap test every 3 years as long as the past 3 Pap tests have been normal.  Some women have medical problems that increase the chance of getting cervical cancer. Talk to your caregiver about these problems. It is especially important to talk to your caregiver if a new problem develops soon after your last Pap test. In these cases, your caregiver may recommend more frequent screening and Pap tests.  The above recommendations are the same for women who have or have not gotten the vaccine for human papillomavirus (HPV).  If you had a hysterectomy for a problem that was not cancer or a condition that could lead to cancer, then you no longer need Pap tests. Even if you no longer need a Pap test, a regular exam is a good idea to make sure no other problems are  starting.  If you are between ages 65 and 70, and you have had normal Pap tests going back 10 years, you no longer need Pap tests. Even if you no longer need a Pap test, a regular exam is a good idea to make sure no other problems are starting.  If you have had past treatment for cervical cancer or a condition that could lead to cancer, you need Pap tests and screening for cancer for at least 20 years after your treatment.  If Pap tests have been discontinued, risk factors (such as a new sexual partner) need to be reassessed to determine if screening should be resumed.  The HPV test is an additional test that may be used for cervical cancer screening. The HPV test looks for the virus that can cause the cell changes on the cervix. The cells collected during the Pap test can be tested for HPV. The HPV test could be used to screen women aged 30 years and older, and should   be used in women of any age who have unclear Pap test results. After the age of 30, women should have HPV testing at the same frequency as a Pap test.  Colorectal cancer can be detected and often prevented. Most routine colorectal cancer screening begins at the age of 50 and continues through age 75. However, your caregiver may recommend screening at an earlier age if you have risk factors for colon cancer. On a yearly basis, your caregiver may provide home test kits to check for hidden blood in the stool. Use of a small camera at the end of a tube, to directly examine the colon (sigmoidoscopy or colonoscopy), can detect the earliest forms of colorectal cancer. Talk to your caregiver about this at age 50, when routine screening begins. Direct examination of the colon should be repeated every 5 to 10 years through age 75, unless early forms of pre-cancerous polyps or small growths are found.  Hepatitis C blood testing is recommended for all people born from 1945 through 1965 and any individual with known risks for hepatitis C.  Practice  safe sex. Use condoms and avoid high-risk sexual practices to reduce the spread of sexually transmitted infections (STIs). STIs include gonorrhea, chlamydia, syphilis, trichomonas, herpes, HPV, and human immunodeficiency virus (HIV). Herpes, HIV, and HPV are viral illnesses that have no cure. They can result in disability, cancer, and death. Sexually active women aged 25 and younger should be checked for chlamydia. Older women with new or multiple partners should also be tested for chlamydia. Testing for other STIs is recommended if you are sexually active and at increased risk.  Osteoporosis is a disease in which the bones lose minerals and strength with aging. This can result in serious bone fractures. The risk of osteoporosis can be identified using a bone density scan. Women ages 65 and over and women at risk for fractures or osteoporosis should discuss screening with their caregivers. Ask your caregiver whether you should take a calcium supplement or vitamin D to reduce the rate of osteoporosis.  Menopause can be associated with physical symptoms and risks. Hormone replacement therapy is available to decrease symptoms and risks. You should talk to your caregiver about whether hormone replacement therapy is right for you.  Use sunscreen with sun protection factor (SPF) of 30 or more. Apply sunscreen liberally and repeatedly throughout the day. You should seek shade when your shadow is shorter than you. Protect yourself by wearing long sleeves, pants, a wide-brimmed hat, and sunglasses year round, whenever you are outdoors.  Once a month, do a whole body skin exam, using a mirror to look at the skin on your back. Notify your caregiver of new moles, moles that have irregular borders, moles that are larger than a pencil eraser, or moles that have changed in shape or color.  Stay current with required immunizations.  Influenza. You need a dose every fall (or winter). The composition of the flu vaccine  changes each year, so being vaccinated once is not enough.  Pneumococcal polysaccharide. You need 1 to 2 doses if you smoke cigarettes or if you have certain chronic medical conditions. You need 1 dose at age 65 (or older) if you have never been vaccinated.  Tetanus, diphtheria, pertussis (Tdap, Td). Get 1 dose of Tdap vaccine if you are younger than age 65, are over 65 and have contact with an infant, are a healthcare worker, are pregnant, or simply want to be protected from whooping cough. After that, you need a Td   booster dose every 10 years. Consult your caregiver if you have not had at least 3 tetanus and diphtheria-containing shots sometime in your life or have a deep or dirty wound.  HPV. You need this vaccine if you are a woman age 26 or younger. The vaccine is given in 3 doses over 6 months.  Measles, mumps, rubella (MMR). You need at least 1 dose of MMR if you were born in 1957 or later. You may also need a second dose.  Meningococcal. If you are age 19 to 21 and a first-year college student living in a residence hall, or have one of several medical conditions, you need to get vaccinated against meningococcal disease. You may also need additional booster doses.  Zoster (shingles). If you are age 60 or older, you should get this vaccine.  Varicella (chickenpox). If you have never had chickenpox or you were vaccinated but received only 1 dose, talk to your caregiver to find out if you need this vaccine.  Hepatitis A. You need this vaccine if you have a specific risk factor for hepatitis A virus infection or you simply wish to be protected from this disease. The vaccine is usually given as 2 doses, 6 to 18 months apart.  Hepatitis B. You need this vaccine if you have a specific risk factor for hepatitis B virus infection or you simply wish to be protected from this disease. The vaccine is given in 3 doses, usually over 6 months. Preventive Services / Frequency Ages 19 to 39  Blood  pressure check.** / Every 1 to 2 years.  Lipid and cholesterol check.** / Every 5 years beginning at age 20.  Clinical breast exam.** / Every 3 years for women in their 20s and 30s.  Pap test.** / Every 2 years from ages 21 through 29. Every 3 years starting at age 30 through age 65 or 70 with a history of 3 consecutive normal Pap tests.  HPV screening.** / Every 3 years from ages 30 through ages 65 to 70 with a history of 3 consecutive normal Pap tests.  Hepatitis C blood test.** / For any individual with known risks for hepatitis C.  Skin self-exam. / Monthly.  Influenza immunization.** / Every year.  Pneumococcal polysaccharide immunization.** / 1 to 2 doses if you smoke cigarettes or if you have certain chronic medical conditions.  Tetanus, diphtheria, pertussis (Tdap, Td) immunization. / A one-time dose of Tdap vaccine. After that, you need a Td booster dose every 10 years.  HPV immunization. / 3 doses over 6 months, if you are 26 and younger.  Measles, mumps, rubella (MMR) immunization. / You need at least 1 dose of MMR if you were born in 1957 or later. You may also need a second dose.  Meningococcal immunization. / 1 dose if you are age 19 to 21 and a first-year college student living in a residence hall, or have one of several medical conditions, you need to get vaccinated against meningococcal disease. You may also need additional booster doses.  Varicella immunization.** / Consult your caregiver.  Hepatitis A immunization.** / Consult your caregiver. 2 doses, 6 to 18 months apart.  Hepatitis B immunization.** / Consult your caregiver. 3 doses usually over 6 months. Ages 40 to 64  Blood pressure check.** / Every 1 to 2 years.  Lipid and cholesterol check.** / Every 5 years beginning at age 20.  Clinical breast exam.** / Every year after age 40.  Mammogram.** / Every year beginning at age 40   and continuing for as long as you are in good health. Consult with your  caregiver.  Pap test.** / Every 3 years starting at age 30 through age 65 or 70 with a history of 3 consecutive normal Pap tests.  HPV screening.** / Every 3 years from ages 30 through ages 65 to 70 with a history of 3 consecutive normal Pap tests.  Fecal occult blood test (FOBT) of stool. / Every year beginning at age 50 and continuing until age 75. You may not need to do this test if you get a colonoscopy every 10 years.  Flexible sigmoidoscopy or colonoscopy.** / Every 5 years for a flexible sigmoidoscopy or every 10 years for a colonoscopy beginning at age 50 and continuing until age 75.  Hepatitis C blood test.** / For all people born from 1945 through 1965 and any individual with known risks for hepatitis C.  Skin self-exam. / Monthly.  Influenza immunization.** / Every year.  Pneumococcal polysaccharide immunization.** / 1 to 2 doses if you smoke cigarettes or if you have certain chronic medical conditions.  Tetanus, diphtheria, pertussis (Tdap, Td) immunization.** / A one-time dose of Tdap vaccine. After that, you need a Td booster dose every 10 years.  Measles, mumps, rubella (MMR) immunization. / You need at least 1 dose of MMR if you were born in 1957 or later. You may also need a second dose.  Varicella immunization.** / Consult your caregiver.  Meningococcal immunization.** / Consult your caregiver.  Hepatitis A immunization.** / Consult your caregiver. 2 doses, 6 to 18 months apart.  Hepatitis B immunization.** / Consult your caregiver. 3 doses, usually over 6 months. Ages 65 and over  Blood pressure check.** / Every 1 to 2 years.  Lipid and cholesterol check.** / Every 5 years beginning at age 20.  Clinical breast exam.** / Every year after age 40.  Mammogram.** / Every year beginning at age 40 and continuing for as long as you are in good health. Consult with your caregiver.  Pap test.** / Every 3 years starting at age 30 through age 65 or 70 with a 3  consecutive normal Pap tests. Testing can be stopped between 65 and 70 with 3 consecutive normal Pap tests and no abnormal Pap or HPV tests in the past 10 years.  HPV screening.** / Every 3 years from ages 30 through ages 65 or 70 with a history of 3 consecutive normal Pap tests. Testing can be stopped between 65 and 70 with 3 consecutive normal Pap tests and no abnormal Pap or HPV tests in the past 10 years.  Fecal occult blood test (FOBT) of stool. / Every year beginning at age 50 and continuing until age 75. You may not need to do this test if you get a colonoscopy every 10 years.  Flexible sigmoidoscopy or colonoscopy.** / Every 5 years for a flexible sigmoidoscopy or every 10 years for a colonoscopy beginning at age 50 and continuing until age 75.  Hepatitis C blood test.** / For all people born from 1945 through 1965 and any individual with known risks for hepatitis C.  Osteoporosis screening.** / A one-time screening for women ages 65 and over and women at risk for fractures or osteoporosis.  Skin self-exam. / Monthly.  Influenza immunization.** / Every year.  Pneumococcal polysaccharide immunization.** / 1 dose at age 65 (or older) if you have never been vaccinated.  Tetanus, diphtheria, pertussis (Tdap, Td) immunization. / A one-time dose of Tdap vaccine if you are over   65 and have contact with an infant, are a healthcare worker, or simply want to be protected from whooping cough. After that, you need a Td booster dose every 10 years.  Varicella immunization.** / Consult your caregiver.  Meningococcal immunization.** / Consult your caregiver.  Hepatitis A immunization.** / Consult your caregiver. 2 doses, 6 to 18 months apart.  Hepatitis B immunization.** / Check with your caregiver. 3 doses, usually over 6 months. ** Family history and personal history of risk and conditions may change your caregiver's recommendations. Document Released: 01/11/2002 Document Revised: 02/07/2012  Document Reviewed: 04/12/2011 [employer] Patient Information 2014 [employer]  

## 2013-07-20 ENCOUNTER — Encounter: Payer: Self-pay | Admitting: Internal Medicine

## 2013-07-20 NOTE — Assessment & Plan Note (Signed)
Her LDL is high but she is at risk for pregnancy so I will not prescribe a statin at this time

## 2013-07-20 NOTE — Progress Notes (Signed)
Subjective:    Patient ID: Faith Sellers, female    DOB: Sep 15, 1976, 37 y.o.   MRN: [id number]  Hypertension This is a chronic problem. The current episode started more than 1 year ago. The problem has been gradually improving since onset. The problem is controlled. Associated symptoms include anxiety. Pertinent negatives include no blurred vision, chest pain, headaches, malaise/fatigue, neck pain, orthopnea, palpitations, peripheral edema, PND, shortness of breath or sweats. Agents associated with hypertension include anorectics. Past treatments include ACE inhibitors and diuretics. The current treatment provides significant improvement. Compliance problems include exercise and diet.       Review of Systems  Constitutional: Negative.  Negative for fever, chills, malaise/fatigue, diaphoresis, activity change, appetite change, fatigue and unexpected weight change.  HENT: Negative.  Negative for neck pain.   Eyes: Negative.  Negative for blurred vision.  Respiratory: Negative.  Negative for cough, choking, chest tightness, shortness of breath, wheezing and stridor.   Cardiovascular: Negative.  Negative for chest pain, palpitations, orthopnea, leg swelling and PND.  Gastrointestinal: Negative.  Negative for abdominal pain.  Endocrine: Negative.   Genitourinary: Negative.   Musculoskeletal: Negative.   Skin: Negative.   Allergic/Immunologic: Negative.   Neurological: Negative.  Negative for dizziness, tremors, weakness, light-headedness and headaches.  Hematological: Negative.  Negative for adenopathy. Does not bruise/bleed easily.  Psychiatric/Behavioral: Negative for suicidal ideas, hallucinations, behavioral problems, confusion, sleep disturbance, self-injury, dysphoric mood, decreased concentration and agitation. The patient is nervous/anxious. The patient is not hyperactive.        Objective:   Physical Exam  Vitals reviewed. Constitutional: She is oriented to person, place, and time.  She appears well-developed and well-nourished. No distress.  HENT:  Head: Normocephalic and atraumatic.  Mouth/Throat: Oropharynx is clear and moist. No oropharyngeal exudate.  Eyes: Conjunctivae are normal. Right eye exhibits no discharge. Left eye exhibits no discharge. No scleral icterus.  Neck: Normal range of motion. Neck supple. No JVD present. No tracheal deviation present. No thyromegaly present.  Cardiovascular: Normal rate, regular rhythm, normal heart sounds and intact distal pulses.  Exam reveals no gallop and no friction rub.   No murmur heard. Pulmonary/Chest: Effort normal and breath sounds normal. No stridor. No respiratory distress. She has no wheezes. She has no rales. Chest wall is not dull to percussion. She exhibits no mass, no tenderness, no bony tenderness, no laceration, no crepitus, no edema, no deformity, no swelling and no retraction. Right breast exhibits no inverted nipple, no mass, no nipple discharge, no skin change and no tenderness. Left breast exhibits no inverted nipple, no mass, no nipple discharge, no skin change and no tenderness. Breasts are symmetrical.  Abdominal: Soft. Bowel sounds are normal. She exhibits no distension and no mass. There is no tenderness. There is no rebound and no guarding.  Musculoskeletal: Normal range of motion. She exhibits no edema and no tenderness.  Lymphadenopathy:    She has no cervical adenopathy.  Neurological: She is oriented to person, place, and time.  Skin: Skin is warm and dry. No rash noted. She is not diaphoretic. No erythema. No pallor.  Psychiatric: She has a normal mood and affect. Her behavior is normal. Judgment and thought content normal.     Lab Results  Component Value Date   WBC 8.0 07/19/2013   HGB 12.4 07/19/2013   HCT 38.2 07/19/2013   PLT 341.0 07/19/2013   GLUCOSE 94 07/19/2013   CHOL 210* 07/19/2013   TRIG 204.0* 07/19/2013   HDL 32.30* 07/19/2013   LDLDIRECT 151.2  07/19/2013   LDLCALC 105* 03/03/2012    ALT 21 07/19/2013   AST 28 07/19/2013   NA 136 07/19/2013   K 3.6 07/19/2013   CL 98 07/19/2013   CREATININE 0.9 07/19/2013   BUN 13 07/19/2013   CO2 29 07/19/2013   TSH 0.86 07/19/2013   INR 0.96 08/28/2011   HGBA1C 6.2 07/19/2013       Assessment & Plan:

## 2013-07-20 NOTE — Assessment & Plan Note (Signed)
She has lost a few pounds with phentermine and is not having any side effects, will continue the same for now

## 2013-07-20 NOTE — Assessment & Plan Note (Signed)
Her BP is well controlled 

## 2013-07-20 NOTE — Assessment & Plan Note (Signed)
Exam done Vaccines were reviewed Labs ordered Pt ed material was given 

## 2013-10-04 ENCOUNTER — Other Ambulatory Visit: Payer: Self-pay

## 2013-11-01 ENCOUNTER — Emergency Department (HOSPITAL_BASED_OUTPATIENT_CLINIC_OR_DEPARTMENT_OTHER): Payer: BC Managed Care – PPO

## 2013-11-01 ENCOUNTER — Telehealth: Payer: Self-pay | Admitting: Internal Medicine

## 2013-11-01 ENCOUNTER — Emergency Department (HOSPITAL_BASED_OUTPATIENT_CLINIC_OR_DEPARTMENT_OTHER)
Admission: EM | Admit: 2013-11-01 | Discharge: 2013-11-01 | Disposition: A | Discharge status: Home / Self Care | Payer: BC Managed Care – PPO | Attending: Emergency Medicine | Admitting: Emergency Medicine

## 2013-11-01 ENCOUNTER — Encounter (HOSPITAL_BASED_OUTPATIENT_CLINIC_OR_DEPARTMENT_OTHER): Payer: Self-pay | Admitting: Emergency Medicine

## 2013-11-01 DIAGNOSIS — I493 Ventricular premature depolarization: Secondary | ICD-10-CM

## 2013-11-01 DIAGNOSIS — Z3202 Encounter for pregnancy test, result negative: Secondary | ICD-10-CM | POA: Insufficient documentation

## 2013-11-01 DIAGNOSIS — I10 Essential (primary) hypertension: Secondary | ICD-10-CM | POA: Insufficient documentation

## 2013-11-01 DIAGNOSIS — I4949 Other premature depolarization: Secondary | ICD-10-CM | POA: Insufficient documentation

## 2013-11-01 DIAGNOSIS — E7800 Pure hypercholesterolemia, unspecified: Secondary | ICD-10-CM | POA: Insufficient documentation

## 2013-11-01 DIAGNOSIS — Z79899 Other long term (current) drug therapy: Secondary | ICD-10-CM | POA: Insufficient documentation

## 2013-11-01 DIAGNOSIS — R079 Chest pain, unspecified: Secondary | ICD-10-CM | POA: Insufficient documentation

## 2013-11-01 DIAGNOSIS — Z8719 Personal history of other diseases of the digestive system: Secondary | ICD-10-CM | POA: Insufficient documentation

## 2013-11-01 DIAGNOSIS — F411 Generalized anxiety disorder: Secondary | ICD-10-CM | POA: Insufficient documentation

## 2013-11-01 LAB — BASIC METABOLIC PANEL
BUN: 13 mg/dL (ref 6–23)
CO2: 25 mEq/L (ref 19–32)
Calcium: 9.7 mg/dL (ref 8.4–10.5)
Chloride: 98 mEq/L (ref 96–112)
Creatinine, Ser: 0.6 mg/dL (ref 0.50–1.10)
GFR calc Af Amer: >90 mL/min (ref >90)
GFR calc non Af Amer: >90 mL/min (ref >90)
Sodium: 136 mEq/L (ref 135–145)

## 2013-11-01 LAB — URINALYSIS, ROUTINE W REFLEX MICROSCOPIC
Bilirubin Urine: NEGATIVE
Glucose, UA: NEGATIVE mg/dL
Hgb urine dipstick: NEGATIVE
Ketones, ur: 15 mg/dL — AB
Leukocytes, UA: NEGATIVE
Nitrite: NEGATIVE
Protein, ur: NEGATIVE mg/dL
Specific Gravity, Urine: 1.019 (ref 1.005–1.030)

## 2013-11-01 LAB — CBC WITH DIFFERENTIAL/PLATELET
Basophils Absolute: 0 10*3/uL (ref 0.0–0.1)
Basophils Relative: 0 % (ref 0–1)
Eosinophils Absolute: 0.2 10*3/uL (ref 0.0–0.7)
Eosinophils Relative: 2 % (ref 0–5)
HCT: 36.9 % (ref 36.0–46.0)
Hemoglobin: 12 g/dL (ref 12.0–15.0)
Lymphocytes Relative: 35 % (ref 12–46)
MCHC: 32.5 g/dL (ref 30.0–36.0)
MCV: 77.5 fL — ABNORMAL LOW (ref 78.0–100.0)
Monocytes Absolute: 0.9 10*3/uL (ref 0.1–1.0)
Platelets: 255 10*3/uL (ref 150–400)
RDW: 15.4 % (ref 11.5–15.5)
WBC: 10 10*3/uL (ref 4.0–10.5)

## 2013-11-01 LAB — TROPONIN I: Troponin I: <0.3 ng/mL (ref <0.30)

## 2013-11-01 NOTE — ED Provider Notes (Signed)
CSN: [id number]     Arrival date & time 11/01/13  1728 History   First MD Initiated Contact with Patient 11/01/13 1735     Chief Complaint  Patient presents with  . Palpitations   (Consider location/radiation/quality/duration/timing/severity/associated sxs/prior Treatment) HPI Comments: Pt states for the last 3 days she has multiple episodes a day of palpitations:pt states that she has had some sharp pain when it happens but the symptoms resolve on their own:no history:no sob:pt states that she started phentermine yesterday and the symptoms seemed more profound today:pt states that she has been under a lot of stress with her mother having brain surgery and she does have a history of anxiety,but this seems a little different  Patient is a 37 y.o. female presenting with palpitations. The history is provided by the patient. No language interpreter was used.  Palpitations Palpitations quality:  Fast Onset quality:  Gradual Duration:  3 days Timing:  Constant Progression:  Worsening Chronicity:  New Context: not caffeine and not exercise   Relieved by:  Nothing Worsened by:  Nothing tried Ineffective treatments:  None tried Associated symptoms: chest pain   Associated symptoms: no diaphoresis, no nausea, no shortness of breath and no vomiting     Past Medical History  Diagnosis Date  . Hypertension   . Hypercholesteremia   . GERD (gastroesophageal reflux disease)   . Gastric ulcer    History reviewed. No pertinent past surgical history. Family History  Problem Relation Age of Onset  . Adopted: Yes  . Hypertension Sister   . COPD Neg Hx   . Cancer Neg Hx   . Early death Neg Hx   . Heart disease Neg Hx   . Hyperlipidemia Neg Hx   . Kidney disease Neg Hx   . Stroke Neg Hx    History  Substance Use Topics  . Smoking status: Never Smoker   . Smokeless tobacco: Never Used  . Alcohol Use: 1.2 oz/week    2 Glasses of wine per week     Comment: occasional   OB History   Grav  Para Term Preterm Abortions TAB SAB Ect Mult Living   4 2 2  2     2      Review of Systems  Constitutional: Negative for diaphoresis.  Respiratory: Negative for shortness of breath.   Cardiovascular: Positive for chest pain and palpitations.  Gastrointestinal: Negative for nausea and vomiting.    Allergies  Review of patient's allergies indicates no known allergies.  Home Medications   Current Outpatient Rx  Name  Route  Sig  Dispense  Refill  . ALPRAZolam (XANAX) 0.5 MG tablet               . Linaclotide (LINZESS) 145 MCG CAPS   Oral   Take 1 capsule (145 mcg total) by mouth daily.   90 capsule   3   . lisinopril-hydrochlorothiazide (PRINZIDE,ZESTORETIC) 20-25 MG per tablet               . phentermine 37.5 MG capsule   Oral   Take 1 capsule (37.5 mg total) by mouth every morning.   30 capsule   2   . tranexamic acid (LYSTEDA) 650 MG TABS tablet               . triamcinolone cream (KENALOG) 0.5 %   Topical   Apply topically 3 (three) times daily.   30 g   3    BP 131/91  Pulse 85  Temp(Src) 98.3 F (36.8 C) (Oral)  Resp 22  Ht 5\' 6"  (1.676 m)  Wt 221 lb (100.245 kg)  BMI 35.69 kg/m2  SpO2 100%  LMP 10/13/2013 Physical Exam  Vitals reviewed. Constitutional: She is oriented to person, place, and time. She appears well-developed and well-nourished.  HENT:  Head: Normocephalic and atraumatic.  Cardiovascular: An irregular rhythm present.  Pulmonary/Chest: Effort normal and breath sounds normal.  Abdominal: Soft. Bowel sounds are normal. There is no tenderness.  Neurological: She is alert and oriented to person, place, and time.  Skin: Skin is warm and dry.  Psychiatric: She has a normal mood and affect.    ED Course  Procedures (including critical care time) Labs Review Labs Reviewed  CBC WITH DIFFERENTIAL - Abnormal; Notable for the following:    MCV 77.5 (*)    MCH 25.2 (*)    All other components within normal limits  URINALYSIS,  ROUTINE W REFLEX MICROSCOPIC - Abnormal; Notable for the following:    APPearance CLOUDY (*)    Ketones, ur 15 (*)    All other components within normal limits  BASIC METABOLIC PANEL  TROPONIN I  PREGNANCY, URINE   Imaging Review Dg Chest 2 View  11/01/2013   CLINICAL DATA:  Palpitations, left chest pain, history hypertension, hypercholesterolemia  EXAM: CHEST  2 VIEW  COMPARISON:  08/28/2011  FINDINGS: Upper normal heart size.  Mediastinal contours and pulmonary vascularity normal.  Lungs clear.  No pleural effusion or pneumothorax.  Bones unremarkable.  IMPRESSION: No acute abnormalities.   Electronically Signed   By: Ulyses Southward M.D.   On: 11/01/2013 18:41    EKG Interpretation    Date/Time:  Thursday November 01 2013 17:48:42 EST Ventricular Rate:  81 PR Interval:  166 QRS Duration: 78 QT Interval:  410 QTC Calculation: 476 R Axis:   15 Text Interpretation:  Sinus rhythm with occasional Premature ventricular complexes Minimal voltage criteria for LVH, may be normal variant Borderline ECG Confirmed by WARD  DO, KRISTEN ([id number]) on 11/01/2013 5:53:28 PM            MDM   1. PVC (premature ventricular contraction)    pvc noted:pt has follow up appointment with RUE:[id number] further to be done at this time    Teressa Lower, NP 11/01/13 [id number]

## 2013-11-01 NOTE — Telephone Encounter (Signed)
Patient Information:  Caller Name: Adiba  Phone: [phone]  Patient: Faith Sellers, Faith Sellers  Gender: Female  DOB: [birth year]  Age: 37 Years  PCP: Sanda Linger (Adults only)  Pregnant: No  Office Follow Up:  Does the office need to follow up with this patient?: No  Instructions For The Office: N/A  RN Note:  Contacted the office and spoke with Nolon Rod  updated on patient status. Referred to ER per protocol. patient provided care advise. Understanding expressed.  Symptoms  Reason For Call & Symptoms: Patient states heart beating funny on Monday 10/29/13. Describes as intermittent. Worse with working or movement it became more intense after eating lunch today.  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 B/P was fine 120/84 and Pulse irregular .  Encouraged to schedule an appt with physician. Patient is scheduled 11/02/13 at 16:00 for evaluation per office staff. .  She denies chest pain , no shortness of breath, no diaphoresis or discomfort.  Heart is racing .  She took her first pill of phenteramine today. ADVISED TO STOP .  Reviewed Health History In EMR: Yes  Reviewed Medications In EMR: Yes  Reviewed Allergies In EMR: Yes  Reviewed Surgeries / Procedures: Yes  Date of Onset of Symptoms: 10/29/2013 OB / GYN:  LMP: 10/13/2013  Guideline(s) Used:  Chest Pain  Disposition Per Guideline:   Go to ED Now  Reason For Disposition Reached:   Heart beating irregularly or very rapidly  Advice Given:  Call Back If:  Severe chest pain  Constant chest pain lasting longer than 5 minutes  Difficulty breathing  You become worse.  RN Overrode Recommendation:  Go To ED  Advised office of patient status. No appt. referred to ER

## 2013-11-01 NOTE — ED Notes (Signed)
Palpitations x 3 days.  

## 2013-11-02 ENCOUNTER — Ambulatory Visit: Payer: BC Managed Care – PPO | Admitting: Internal Medicine

## 2013-11-02 DIAGNOSIS — Z0289 Encounter for other administrative examinations: Secondary | ICD-10-CM

## 2013-11-02 NOTE — ED Provider Notes (Signed)
Medical screening examination/treatment/procedure(s) were performed by non-physician practitioner and as supervising physician I was immediately available for consultation/collaboration.  EKG Interpretation    Date/Time:  Thursday November 01 2013 17:48:42 EST Ventricular Rate:  81 PR Interval:  166 QRS Duration: 78 QT Interval:  410 QTC Calculation: 476 R Axis:   15 Text Interpretation:  Sinus rhythm with occasional Premature ventricular complexes Minimal voltage criteria for LVH, may be normal variant Borderline ECG Confirmed by WARD  DO, KRISTEN ([id number]) on 11/01/2013 5:53:28 PM              Layla Maw Ward, DO 11/02/13 [id number]

## 2013-11-12 ENCOUNTER — Other Ambulatory Visit: Payer: Self-pay | Admitting: Internal Medicine

## 2013-11-30 ENCOUNTER — Ambulatory Visit: Payer: BC Managed Care – PPO | Admitting: Internal Medicine

## 2013-11-30 DIAGNOSIS — Z0289 Encounter for other administrative examinations: Secondary | ICD-10-CM

## 2014-06-23 ENCOUNTER — Other Ambulatory Visit: Payer: Self-pay | Admitting: Internal Medicine

## 2014-07-30 ENCOUNTER — Encounter: Payer: Self-pay | Admitting: Internal Medicine

## 2014-07-30 ENCOUNTER — Other Ambulatory Visit (INDEPENDENT_AMBULATORY_CARE_PROVIDER_SITE_OTHER): Payer: BC Managed Care – PPO

## 2014-07-30 ENCOUNTER — Ambulatory Visit (INDEPENDENT_AMBULATORY_CARE_PROVIDER_SITE_OTHER): Payer: BC Managed Care – PPO | Admitting: Internal Medicine

## 2014-07-30 VITALS — BP 118/84 | HR 79 | Temp 98.3°F | Resp 16 | Ht 66.0 in | Wt 230.0 lb

## 2014-07-30 DIAGNOSIS — G4700 Insomnia, unspecified: Secondary | ICD-10-CM

## 2014-07-30 DIAGNOSIS — Z0000 Encounter for general adult medical examination without abnormal findings: Secondary | ICD-10-CM

## 2014-07-30 DIAGNOSIS — IMO0001 Reserved for inherently not codable concepts without codable children: Secondary | ICD-10-CM

## 2014-07-30 DIAGNOSIS — F418 Other specified anxiety disorders: Secondary | ICD-10-CM

## 2014-07-30 DIAGNOSIS — F341 Dysthymic disorder: Secondary | ICD-10-CM

## 2014-07-30 DIAGNOSIS — I10 Essential (primary) hypertension: Secondary | ICD-10-CM

## 2014-07-30 DIAGNOSIS — Z23 Encounter for immunization: Secondary | ICD-10-CM

## 2014-07-30 LAB — URINALYSIS, ROUTINE W REFLEX MICROSCOPIC
Bilirubin Urine: NEGATIVE
Ketones, ur: NEGATIVE
Leukocytes, UA: NEGATIVE
Nitrite: NEGATIVE
SPECIFIC GRAVITY, URINE: 1.01 (ref 1.000–1.030)
Total Protein, Urine: NEGATIVE
Urine Glucose: NEGATIVE
Urobilinogen, UA: 0.2 (ref 0.0–1.0)
pH: 7 (ref 5.0–8.0)

## 2014-07-30 LAB — COMPREHENSIVE METABOLIC PANEL
ALBUMIN: 3.6 g/dL (ref 3.5–5.2)
ALT: 23 U/L (ref 0–35)
AST: 25 U/L (ref 0–37)
Alkaline Phosphatase: 85 U/L (ref 39–117)
BUN: 8 mg/dL (ref 6–23)
CO2: 28 mEq/L (ref 19–32)
Calcium: 8.9 mg/dL (ref 8.4–10.5)
Chloride: 102 mEq/L (ref 96–112)
Creatinine, Ser: 0.6 mg/dL (ref 0.4–1.2)
GFR: 135.42 mL/min (ref >60.00)
Glucose, Bld: 99 mg/dL (ref 70–99)
POTASSIUM: 3.4 meq/L — AB (ref 3.5–5.1)
SODIUM: 138 meq/L (ref 135–145)
Total Bilirubin: 0.6 mg/dL (ref 0.2–1.2)
Total Protein: 7.6 g/dL (ref 6.0–8.3)

## 2014-07-30 LAB — LIPID PANEL
Cholesterol: 197 mg/dL (ref 0–200)
HDL: 34.1 mg/dL — AB (ref >39.00)
LDL Cholesterol: 133 mg/dL — ABNORMAL HIGH (ref 0–99)
NONHDL: 162.9
Total CHOL/HDL Ratio: 6
Triglycerides: 152 mg/dL — ABNORMAL HIGH (ref 0.0–149.0)
VLDL: 30.4 mg/dL (ref 0.0–40.0)

## 2014-07-30 LAB — CBC WITH DIFFERENTIAL/PLATELET
BASOS ABS: 0 10*3/uL (ref 0.0–0.1)
Basophils Relative: 0.3 % (ref 0.0–3.0)
EOS PCT: 3.5 % (ref 0.0–5.0)
Eosinophils Absolute: 0.3 10*3/uL (ref 0.0–0.7)
HCT: 36.5 % (ref 36.0–46.0)
Hemoglobin: 11.7 g/dL — ABNORMAL LOW (ref 12.0–15.0)
Lymphocytes Relative: 43.8 % (ref 12.0–46.0)
Lymphs Abs: 3.2 10*3/uL (ref 0.7–4.0)
MCHC: 32 g/dL (ref 30.0–36.0)
MCV: 73.4 fl — AB (ref 78.0–100.0)
Monocytes Absolute: 0.7 10*3/uL (ref 0.1–1.0)
Monocytes Relative: 9.7 % (ref 3.0–12.0)
NEUTROS PCT: 42.7 % — AB (ref 43.0–77.0)
Neutro Abs: 3.1 10*3/uL (ref 1.4–7.7)
PLATELETS: 321 10*3/uL (ref 150.0–400.0)
RBC: 4.97 Mil/uL (ref 3.87–5.11)
RDW: 16.5 % — ABNORMAL HIGH (ref 11.5–15.5)
WBC: 7.2 10*3/uL (ref 4.0–10.5)

## 2014-07-30 LAB — HCG, QUANTITATIVE, PREGNANCY: Quantitative HCG: 0.4 m[IU]/mL

## 2014-07-30 LAB — TSH: TSH: 0.97 u[IU]/mL (ref 0.35–4.50)

## 2014-07-30 MED ORDER — PHENTERMINE HCL 37.5 MG PO CAPS: 37.5000 mg | ORAL_CAPSULE | ORAL | Status: DC

## 2014-07-30 MED ORDER — ZOLPIDEM TARTRATE 5 MG PO TABS: 5.0000 mg | ORAL_TABLET | Freq: Every evening | ORAL | Status: DC | PRN

## 2014-07-30 NOTE — Patient Instructions (Signed)
Preventive Care for Adults A healthy lifestyle and preventive care can promote health and wellness. Preventive health guidelines for women include the following key practices.  A routine yearly physical is a good way to check with your health care provider about your health and preventive screening. It is a chance to share any concerns and updates on your health and to receive a thorough exam.  Visit your dentist for a routine exam and preventive care every 6 months. Brush your teeth twice a day and floss once a day. Good oral hygiene prevents tooth decay and gum disease.  The frequency of eye exams is based on your age, health, family medical history, use of contact lenses, and other factors. Follow your health care provider's recommendations for frequency of eye exams.  Eat a healthy diet. Foods like vegetables, fruits, whole grains, low-fat dairy products, and lean protein foods contain the nutrients you need without too many calories. Decrease your intake of foods high in solid fats, added sugars, and salt. Eat the right amount of calories for you.Get information about a proper diet from your health care provider, if necessary.  Regular physical exercise is one of the most important things you can do for your health. Most adults should get at least 150 minutes of moderate-intensity exercise (any activity that increases your heart rate and causes you to sweat) each week. In addition, most adults need muscle-strengthening exercises on 2 or more days a week.  Maintain a healthy weight. The body mass index (BMI) is a screening tool to identify possible weight problems. It provides an estimate of body fat based on height and weight. Your health care provider can find your BMI and can help you achieve or maintain a healthy weight.For adults 20 years and older:  A BMI below 18.5 is considered underweight.  A BMI of 18.5 to 24.9 is normal.  A BMI of 25 to 29.9 is considered overweight.  A BMI of  30 and above is considered obese.  Maintain normal blood lipids and cholesterol levels by exercising and minimizing your intake of saturated fat. Eat a balanced diet with plenty of fruit and vegetables. Blood tests for lipids and cholesterol should begin at age 76 and be repeated every 5 years. If your lipid or cholesterol levels are high, you are over 50, or you are at high risk for heart disease, you may need your cholesterol levels checked more frequently.Ongoing high lipid and cholesterol levels should be treated with medicines if diet and exercise are not working.  If you smoke, find out from your health care provider how to quit. If you do not use tobacco, do not start.  Lung cancer screening is recommended for adults aged 22-80 years who are at high risk for developing lung cancer because of a history of smoking. A yearly low-dose CT scan of the lungs is recommended for people who have at least a 30-pack-year history of smoking and are a current smoker or have quit within the past 15 years. A pack year of smoking is smoking an average of 1 pack of cigarettes a day for 1 year (for example: 1 pack a day for 30 years or 2 packs a day for 15 years). Yearly screening should continue until the smoker has stopped smoking for at least 15 years. Yearly screening should be stopped for people who develop a health problem that would prevent them from having lung cancer treatment.  If you are pregnant, do not drink alcohol. If you are breastfeeding,  be very cautious about drinking alcohol. If you are not pregnant and choose to drink alcohol, do not have more than 1 drink per day. One drink is considered to be 12 ounces (355 mL) of beer, 5 ounces (148 mL) of wine, or 1.5 ounces (44 mL) of liquor.  Avoid use of street drugs. Do not share needles with anyone. Ask for help if you need support or instructions about stopping the use of drugs.  High blood pressure causes heart disease and increases the risk of  stroke. Your blood pressure should be checked at least every 1 to 2 years. Ongoing high blood pressure should be treated with medicines if weight loss and exercise do not work.  If you are 75-52 years old, ask your health care provider if you should take aspirin to prevent strokes.  Diabetes screening involves taking a blood sample to check your fasting blood sugar level. This should be done once every 3 years, after age 15, if you are within normal weight and without risk factors for diabetes. Testing should be considered at a younger age or be carried out more frequently if you are overweight and have at least 1 risk factor for diabetes.  Breast cancer screening is essential preventive care for women. You should practice "breast self-awareness." This means understanding the normal appearance and feel of your breasts and may include breast self-examination. Any changes detected, no matter how small, should be reported to a health care provider. Women in their 58s and 30s should have a clinical breast exam (CBE) by a health care provider as part of a regular health exam every 1 to 3 years. After age 16, women should have a CBE every year. Starting at age 53, women should consider having a mammogram (breast X-ray test) every year. Women who have a family history of breast cancer should talk to their health care provider about genetic screening. Women at a high risk of breast cancer should talk to their health care providers about having an MRI and a mammogram every year.  Breast cancer gene (BRCA)-related cancer risk assessment is recommended for women who have family members with BRCA-related cancers. BRCA-related cancers include breast, ovarian, tubal, and peritoneal cancers. Having family members with these cancers may be associated with an increased risk for harmful changes (mutations) in the breast cancer genes BRCA1 and BRCA2. Results of the assessment will determine the need for genetic counseling and  BRCA1 and BRCA2 testing.  Routine pelvic exams to screen for cancer are no longer recommended for nonpregnant women who are considered low risk for cancer of the pelvic organs (ovaries, uterus, and vagina) and who do not have symptoms. Ask your health care provider if a screening pelvic exam is right for you.  If you have had past treatment for cervical cancer or a condition that could lead to cancer, you need Pap tests and screening for cancer for at least 20 years after your treatment. If Pap tests have been discontinued, your risk factors (such as having a new sexual partner) need to be reassessed to determine if screening should be resumed. Some women have medical problems that increase the chance of getting cervical cancer. In these cases, your health care provider may recommend more frequent screening and Pap tests.  The HPV test is an additional test that may be used for cervical cancer screening. The HPV test looks for the virus that can cause the cell changes on the cervix. The cells collected during the Pap test can be  tested for HPV. The HPV test could be used to screen women aged 30 years and older, and should be used in women of any age who have unclear Pap test results. After the age of 30, women should have HPV testing at the same frequency as a Pap test.  Colorectal cancer can be detected and often prevented. Most routine colorectal cancer screening begins at the age of 50 years and continues through age 75 years. However, your health care provider may recommend screening at an earlier age if you have risk factors for colon cancer. On a yearly basis, your health care provider may provide home test kits to check for hidden blood in the stool. Use of a small camera at the end of a tube, to directly examine the colon (sigmoidoscopy or colonoscopy), can detect the earliest forms of colorectal cancer. Talk to your health care provider about this at age 50, when routine screening begins. Direct  exam of the colon should be repeated every 5-10 years through age 75 years, unless early forms of pre-cancerous polyps or small growths are found.  People who are at an increased risk for hepatitis B should be screened for this virus. You are considered at high risk for hepatitis B if:  You were born in a country where hepatitis B occurs often. Talk with your health care provider about which countries are considered high risk.  Your parents were born in a high-risk country and you have not received a shot to protect against hepatitis B (hepatitis B vaccine).  You have HIV or AIDS.  You use needles to inject street drugs.  You live with, or have sex with, someone who has hepatitis B.  You get hemodialysis treatment.  You take certain medicines for conditions like cancer, organ transplantation, and autoimmune conditions.  Hepatitis C blood testing is recommended for all people born from 1945 through 1965 and any individual with known risks for hepatitis C.  Practice safe sex. Use condoms and avoid high-risk sexual practices to reduce the spread of sexually transmitted infections (STIs). STIs include gonorrhea, chlamydia, syphilis, trichomonas, herpes, HPV, and human immunodeficiency virus (HIV). Herpes, HIV, and HPV are viral illnesses that have no cure. They can result in disability, cancer, and death.  You should be screened for sexually transmitted illnesses (STIs) including gonorrhea and chlamydia if:  You are sexually active and are younger than 24 years.  You are older than 24 years and your health care provider tells you that you are at risk for this type of infection.  Your sexual activity has changed since you were last screened and you are at an increased risk for chlamydia or gonorrhea. Ask your health care provider if you are at risk.  If you are at risk of being infected with HIV, it is recommended that you take a prescription medicine daily to prevent HIV infection. This is  called preexposure prophylaxis (PrEP). You are considered at risk if:  You are a heterosexual woman, are sexually active, and are at increased risk for HIV infection.  You take drugs by injection.  You are sexually active with a partner who has HIV.  Talk with your health care provider about whether you are at high risk of being infected with HIV. If you choose to begin PrEP, you should first be tested for HIV. You should then be tested every 3 months for as long as you are taking PrEP.  Osteoporosis is a disease in which the bones lose minerals and strength   with aging. This can result in serious bone fractures or breaks. The risk of osteoporosis can be identified using a bone density scan. Women ages 65 years and over and women at risk for fractures or osteoporosis should discuss screening with their health care providers. Ask your health care provider whether you should take a calcium supplement or vitamin D to reduce the rate of osteoporosis.  Menopause can be associated with physical symptoms and risks. Hormone replacement therapy is available to decrease symptoms and risks. You should talk to your health care provider about whether hormone replacement therapy is right for you.  Use sunscreen. Apply sunscreen liberally and repeatedly throughout the day. You should seek shade when your shadow is shorter than you. Protect yourself by wearing long sleeves, pants, a wide-brimmed hat, and sunglasses year round, whenever you are outdoors.  Once a month, do a whole body skin exam, using a mirror to look at the skin on your back. Tell your health care provider of new moles, moles that have irregular borders, moles that are larger than a pencil eraser, or moles that have changed in shape or color.  Stay current with required vaccines (immunizations).  Influenza vaccine. All adults should be immunized every year.  Tetanus, diphtheria, and acellular pertussis (Td, Tdap) vaccine. Pregnant women should  receive 1 dose of Tdap vaccine during each pregnancy. The dose should be obtained regardless of the length of time since the last dose. Immunization is preferred during the 27th-36th week of gestation. An adult who has not previously received Tdap or who does not know her vaccine status should receive 1 dose of Tdap. This initial dose should be followed by tetanus and diphtheria toxoids (Td) booster doses every 10 years. Adults with an unknown or incomplete history of completing a 3-dose immunization series with Td-containing vaccines should begin or complete a primary immunization series including a Tdap dose. Adults should receive a Td booster every 10 years.  Varicella vaccine. An adult without evidence of immunity to varicella should receive 2 doses or a second dose if she has previously received 1 dose. Pregnant females who do not have evidence of immunity should receive the first dose after pregnancy. This first dose should be obtained before leaving the health care facility. The second dose should be obtained 4-8 weeks after the first dose.  Human papillomavirus (HPV) vaccine. Females aged 13-26 years who have not received the vaccine previously should obtain the 3-dose series. The vaccine is not recommended for use in pregnant females. However, pregnancy testing is not needed before receiving a dose. If a female is found to be pregnant after receiving a dose, no treatment is needed. In that case, the remaining doses should be delayed until after the pregnancy. Immunization is recommended for any person with an immunocompromised condition through the age of 26 years if she did not get any or all doses earlier. During the 3-dose series, the second dose should be obtained 4-8 weeks after the first dose. The third dose should be obtained 24 weeks after the first dose and 16 weeks after the second dose.  Zoster vaccine. One dose is recommended for adults aged 60 years or older unless certain conditions are  present.  Measles, mumps, and rubella (MMR) vaccine. Adults born before 1957 generally are considered immune to measles and mumps. Adults born in 1957 or later should have 1 or more doses of MMR vaccine unless there is a contraindication to the vaccine or there is laboratory evidence of immunity to   each of the three diseases. A routine second dose of MMR vaccine should be obtained at least 28 days after the first dose for students attending postsecondary schools, health care workers, or international travelers. People who received inactivated measles vaccine or an unknown type of measles vaccine during 1963-1967 should receive 2 doses of MMR vaccine. People who received inactivated mumps vaccine or an unknown type of mumps vaccine before 1979 and are at high risk for mumps infection should consider immunization with 2 doses of MMR vaccine. For females of childbearing age, rubella immunity should be determined. If there is no evidence of immunity, females who are not pregnant should be vaccinated. If there is no evidence of immunity, females who are pregnant should delay immunization until after pregnancy. Unvaccinated health care workers born before 1957 who lack laboratory evidence of measles, mumps, or rubella immunity or laboratory confirmation of disease should consider measles and mumps immunization with 2 doses of MMR vaccine or rubella immunization with 1 dose of MMR vaccine.  Pneumococcal 13-valent conjugate (PCV13) vaccine. When indicated, a person who is uncertain of her immunization history and has no record of immunization should receive the PCV13 vaccine. An adult aged 19 years or older who has certain medical conditions and has not been previously immunized should receive 1 dose of PCV13 vaccine. This PCV13 should be followed with a dose of pneumococcal polysaccharide (PPSV23) vaccine. The PPSV23 vaccine dose should be obtained at least 8 weeks after the dose of PCV13 vaccine. An adult aged 19  years or older who has certain medical conditions and previously received 1 or more doses of PPSV23 vaccine should receive 1 dose of PCV13. The PCV13 vaccine dose should be obtained 1 or more years after the last PPSV23 vaccine dose.  Pneumococcal polysaccharide (PPSV23) vaccine. When PCV13 is also indicated, PCV13 should be obtained first. All adults aged 65 years and older should be immunized. An adult younger than age 65 years who has certain medical conditions should be immunized. Any person who resides in a nursing home or long-term care facility should be immunized. An adult smoker should be immunized. People with an immunocompromised condition and certain other conditions should receive both PCV13 and PPSV23 vaccines. People with human immunodeficiency virus (HIV) infection should be immunized as soon as possible after diagnosis. Immunization during chemotherapy or radiation therapy should be avoided. Routine use of PPSV23 vaccine is not recommended for American Indians, [place] Natives, or people younger than 65 years unless there are medical conditions that require PPSV23 vaccine. When indicated, people who have unknown immunization and have no record of immunization should receive PPSV23 vaccine. One-time revaccination 5 years after the first dose of PPSV23 is recommended for people aged 19-64 years who have chronic kidney failure, nephrotic syndrome, asplenia, or immunocompromised conditions. People who received 1-2 doses of PPSV23 before age 65 years should receive another dose of PPSV23 vaccine at age 65 years or later if at least 5 years have passed since the previous dose. Doses of PPSV23 are not needed for people immunized with PPSV23 at or after age 65 years.  Meningococcal vaccine. Adults with asplenia or persistent complement component deficiencies should receive 2 doses of quadrivalent meningococcal conjugate (MenACWY-D) vaccine. The doses should be obtained at least 2 months apart.  Microbiologists working with certain meningococcal bacteria, military recruits, people at risk during an outbreak, and people who travel to or live in countries with a high rate of meningitis should be immunized. A first-year college student up through age   21 years who is living in a residence hall should receive a dose if she did not receive a dose on or after her 16th birthday. Adults who have certain high-risk conditions should receive one or more doses of vaccine.  Hepatitis A vaccine. Adults who wish to be protected from this disease, have certain high-risk conditions, work with hepatitis A-infected animals, work in hepatitis A research labs, or travel to or work in countries with a high rate of hepatitis A should be immunized. Adults who were previously unvaccinated and who anticipate close contact with an international adoptee during the first 60 days after arrival in the [place] from a country with a high rate of hepatitis A should be immunized.  Hepatitis B vaccine. Adults who wish to be protected from this disease, have certain high-risk conditions, may be exposed to blood or other infectious body fluids, are household contacts or sex partners of hepatitis B positive people, are clients or workers in certain care facilities, or travel to or work in countries with a high rate of hepatitis B should be immunized.  Haemophilus influenzae type b (Hib) vaccine. A previously unvaccinated person with asplenia or sickle cell disease or having a scheduled splenectomy should receive 1 dose of Hib vaccine. Regardless of previous immunization, a recipient of a hematopoietic stem cell transplant should receive a 3-dose series 6-12 months after her successful transplant. Hib vaccine is not recommended for adults with HIV infection. Preventive Services / Frequency Ages 64 to 68 years  Blood pressure check.** / Every 1 to 2 years.  Lipid and cholesterol check.** / Every 5 years beginning at age  22.  Clinical breast exam.** / Every 3 years for women in their 88s and 53s.  BRCA-related cancer risk assessment.** / For women who have family members with a BRCA-related cancer (breast, ovarian, tubal, or peritoneal cancers).  Pap test.** / Every 2 years from ages 90 through 51. Every 3 years starting at age 21 through age 56 or 3 with a history of 3 consecutive normal Pap tests.  HPV screening.** / Every 3 years from ages 24 through ages 1 to 46 with a history of 3 consecutive normal Pap tests.  Hepatitis C blood test.** / For any individual with known risks for hepatitis C.  Skin self-exam. / Monthly.  Influenza vaccine. / Every year.  Tetanus, diphtheria, and acellular pertussis (Tdap, Td) vaccine.** / Consult your health care provider. Pregnant women should receive 1 dose of Tdap vaccine during each pregnancy. 1 dose of Td every 10 years.  Varicella vaccine.** / Consult your health care provider. Pregnant females who do not have evidence of immunity should receive the first dose after pregnancy.  HPV vaccine. / 3 doses over 6 months, if 72 and younger. The vaccine is not recommended for use in pregnant females. However, pregnancy testing is not needed before receiving a dose.  Measles, mumps, rubella (MMR) vaccine.** / You need at least 1 dose of MMR if you were born in 1957 or later. You may also need a 2nd dose. For females of childbearing age, rubella immunity should be determined. If there is no evidence of immunity, females who are not pregnant should be vaccinated. If there is no evidence of immunity, females who are pregnant should delay immunization until after pregnancy.  Pneumococcal 13-valent conjugate (PCV13) vaccine.** / Consult your health care provider.  Pneumococcal polysaccharide (PPSV23) vaccine.** / 1 to 2 doses if you smoke cigarettes or if you have certain conditions.  Meningococcal vaccine.** /  1 dose if you are age 19 to 21 years and a first-year college  student living in a residence hall, or have one of several medical conditions, you need to get vaccinated against meningococcal disease. You may also need additional booster doses.  Hepatitis A vaccine.** / Consult your health care provider.  Hepatitis B vaccine.** / Consult your health care provider.  Haemophilus influenzae type b (Hib) vaccine.** / Consult your health care provider. Ages 40 to 64 years  Blood pressure check.** / Every 1 to 2 years.  Lipid and cholesterol check.** / Every 5 years beginning at age 20 years.  Lung cancer screening. / Every year if you are aged 55-80 years and have a 30-pack-year history of smoking and currently smoke or have quit within the past 15 years. Yearly screening is stopped once you have quit smoking for at least 15 years or develop a health problem that would prevent you from having lung cancer treatment.  Clinical breast exam.** / Every year after age 40 years.  BRCA-related cancer risk assessment.** / For women who have family members with a BRCA-related cancer (breast, ovarian, tubal, or peritoneal cancers).  Mammogram.** / Every year beginning at age 40 years and continuing for as long as you are in good health. Consult with your health care provider.  Pap test.** / Every 3 years starting at age 30 years through age 65 or 70 years with a history of 3 consecutive normal Pap tests.  HPV screening.** / Every 3 years from ages 30 years through ages 65 to 70 years with a history of 3 consecutive normal Pap tests.  Fecal occult blood test (FOBT) of stool. / Every year beginning at age 50 years and continuing until age 75 years. You may not need to do this test if you get a colonoscopy every 10 years.  Flexible sigmoidoscopy or colonoscopy.** / Every 5 years for a flexible sigmoidoscopy or every 10 years for a colonoscopy beginning at age 50 years and continuing until age 75 years.  Hepatitis C blood test.** / For all people born from 1945 through  1965 and any individual with known risks for hepatitis C.  Skin self-exam. / Monthly.  Influenza vaccine. / Every year.  Tetanus, diphtheria, and acellular pertussis (Tdap/Td) vaccine.** / Consult your health care provider. Pregnant women should receive 1 dose of Tdap vaccine during each pregnancy. 1 dose of Td every 10 years.  Varicella vaccine.** / Consult your health care provider. Pregnant females who do not have evidence of immunity should receive the first dose after pregnancy.  Zoster vaccine.** / 1 dose for adults aged 60 years or older.  Measles, mumps, rubella (MMR) vaccine.** / You need at least 1 dose of MMR if you were born in 1957 or later. You may also need a 2nd dose. For females of childbearing age, rubella immunity should be determined. If there is no evidence of immunity, females who are not pregnant should be vaccinated. If there is no evidence of immunity, females who are pregnant should delay immunization until after pregnancy.  Pneumococcal 13-valent conjugate (PCV13) vaccine.** / Consult your health care provider.  Pneumococcal polysaccharide (PPSV23) vaccine.** / 1 to 2 doses if you smoke cigarettes or if you have certain conditions.  Meningococcal vaccine.** / Consult your health care provider.  Hepatitis A vaccine.** / Consult your health care provider.  Hepatitis B vaccine.** / Consult your health care provider.  Haemophilus influenzae type b (Hib) vaccine.** / Consult your health care provider. Ages 65   years and over  Blood pressure check.** / Every 1 to 2 years.  Lipid and cholesterol check.** / Every 5 years beginning at age 20 years.  Lung cancer screening. / Every year if you are aged 23-80 years and have a 30-pack-year history of smoking and currently smoke or have quit within the past 15 years. Yearly screening is stopped once you have quit smoking for at least 15 years or develop a health problem that would prevent you from having lung cancer  treatment.  Clinical breast exam.** / Every year after age 9 years.  BRCA-related cancer risk assessment.** / For women who have family members with a BRCA-related cancer (breast, ovarian, tubal, or peritoneal cancers).  Mammogram.** / Every year beginning at age 35 years and continuing for as long as you are in good health. Consult with your health care provider.  Pap test.** / Every 3 years starting at age 37 years through age 44 or 21 years with 3 consecutive normal Pap tests. Testing can be stopped between 65 and 70 years with 3 consecutive normal Pap tests and no abnormal Pap or HPV tests in the past 10 years.  HPV screening.** / Every 3 years from ages 59 years through ages 27 or 24 years with a history of 3 consecutive normal Pap tests. Testing can be stopped between 65 and 70 years with 3 consecutive normal Pap tests and no abnormal Pap or HPV tests in the past 10 years.  Fecal occult blood test (FOBT) of stool. / Every year beginning at age 90 years and continuing until age 78 years. You may not need to do this test if you get a colonoscopy every 10 years.  Flexible sigmoidoscopy or colonoscopy.** / Every 5 years for a flexible sigmoidoscopy or every 10 years for a colonoscopy beginning at age 33 years and continuing until age 93 years.  Hepatitis C blood test.** / For all people born from [birth year] through 1966 and any individual with known risks for hepatitis C.  Osteoporosis screening.** / A one-time screening for women ages 50 years and over and women at risk for fractures or osteoporosis.  Skin self-exam. / Monthly.  Influenza vaccine. / Every year.  Tetanus, diphtheria, and acellular pertussis (Tdap/Td) vaccine.** / 1 dose of Td every 10 years.  Varicella vaccine.** / Consult your health care provider.  Zoster vaccine.** / 1 dose for adults aged 28 years or older.  Pneumococcal 13-valent conjugate (PCV13) vaccine.** / Consult your health care provider.  Pneumococcal  polysaccharide (PPSV23) vaccine.** / 1 dose for all adults aged 68 years and older.  Meningococcal vaccine.** / Consult your health care provider.  Hepatitis A vaccine.** / Consult your health care provider.  Hepatitis B vaccine.** / Consult your health care provider.  Haemophilus influenzae type b (Hib) vaccine.** / Consult your health care provider. ** Family history and personal history of risk and conditions may change your health care provider's recommendations. Document Released: 01/11/2002 Document Revised: 04/01/2014 Document Reviewed: 04/12/2011 [facility] Patient Information 2015 [place], [place]. This information is not intended to replace advice given to you by your health care provider. Make sure you discuss any questions you have with your health care provider. Obesity Obesity is defined as having too much total body fat and a body mass index (BMI) of 30 or more. BMI is an estimate of body fat and is calculated from your height and weight. Obesity happens when you consume more calories than you can burn by exercising or performing daily physical tasks.  Prolonged obesity can cause major illnesses or emergencies, such as:   Stroke.  Heart disease.  Diabetes.  Cancer.  Arthritis.  High blood pressure (hypertension).  High cholesterol.  Sleep apnea.  Erectile dysfunction.  Infertility problems. CAUSES   Regularly eating unhealthy foods.  Physical inactivity.  Certain disorders, such as an underactive thyroid (hypothyroidism), Cushing's syndrome, and polycystic ovarian syndrome.  Certain medicines, such as steroids, some depression medicines, and antipsychotics.  Genetics.  Lack of sleep. DIAGNOSIS  A health care provider can diagnose obesity after calculating your BMI. Obesity will be diagnosed if your BMI is 30 or higher.  There are other methods of measuring obesity levels. Some other methods include measuring your skinfold thickness, your waist  circumference, and comparing your hip circumference to your waist circumference. TREATMENT  A healthy treatment program includes some or all of the following:  Long-term dietary changes.  Exercise and physical activity.  Behavioral and lifestyle changes.  Medicine only under the supervision of your health care provider. Medicines may help, but only if they are used with diet and exercise programs. An unhealthy treatment program includes:  Fasting.  Fad diets.  Supplements and drugs. These choices do not succeed in long-term weight control.  HOME CARE INSTRUCTIONS   Exercise and perform physical activity as directed by your health care provider. To increase physical activity, try the following:  Use stairs instead of elevators.  Park farther away from store entrances.  Garden, bike, or walk instead of watching television or using the computer.  Eat healthy, low-calorie foods and drinks on a regular basis. Eat more fruits and vegetables. Use low-calorie cookbooks or take healthy cooking classes.  Limit fast food, sweets, and processed snack foods.  Eat smaller portions.  Keep a daily journal of everything you eat. There are many free websites to help you with this. It may be helpful to measure your foods so you can determine if you are eating the correct portion sizes.  Avoid drinking alcohol. Drink more water and drinks without calories.  Take vitamins and supplements only as recommended by your health care provider.  Weight-loss support groups, Tax adviser, counselors, and stress reduction education can also be very helpful. SEEK IMMEDIATE MEDICAL CARE IF:  You have chest pain or tightness.  You have trouble breathing or feel short of breath.  You have weakness or leg numbness.  You feel confused or have trouble talking.  You have sudden changes in your vision. MAKE SURE YOU:  Understand these instructions.  Will watch your condition.  Will get  help right away if you are not doing well or get worse. Document Released: 12/23/2004 Document Revised: 04/01/2014 Document Reviewed: 12/22/2011 [facility] Patient Information 2015 [place], [place]. This information is not intended to replace advice given to you by your health care provider. Make sure you discuss any questions you have with your health care provider.

## 2014-07-30 NOTE — Progress Notes (Signed)
Subjective:    Patient ID: Faith Sellers, female    DOB: 1976/11/07, 38 y.o.   MRN: [id number]  Hypertension This is a chronic problem. The current episode started more than 1 year ago. The problem has been rapidly improving since onset. The problem is controlled. Associated symptoms include anxiety. Pertinent negatives include no blurred vision, chest pain, headaches, malaise/fatigue, neck pain, orthopnea, palpitations, peripheral edema, PND, shortness of breath or sweats. Risk factors for coronary artery disease include obesity. Past treatments include ACE inhibitors and diuretics. The current treatment provides moderate improvement. Compliance problems include exercise and psychosocial issues (she has not taken any meds for months).       Review of Systems  Constitutional: Positive for unexpected weight change (weight gain). Negative for fever, chills, malaise/fatigue, diaphoresis, appetite change and fatigue.  HENT: Negative.   Eyes: Negative.  Negative for blurred vision.  Respiratory: Negative.  Negative for cough, choking, chest tightness, shortness of breath, wheezing and stridor.   Cardiovascular: Negative.  Negative for chest pain, palpitations, orthopnea, leg swelling and PND.  Gastrointestinal: Negative.  Negative for nausea, vomiting, abdominal pain, diarrhea, constipation and blood in stool.  Endocrine: Negative.   Genitourinary: Negative.   Musculoskeletal: Negative.  Negative for arthralgias, back pain and neck pain.  Skin: Negative.  Negative for rash.  Allergic/Immunologic: Negative.   Neurological: Negative.  Negative for dizziness, syncope, speech difficulty, weakness, light-headedness, numbness and headaches.  Hematological: Negative.  Negative for adenopathy. Does not bruise/bleed easily.  Psychiatric/Behavioral: Positive for sleep disturbance. Negative for suicidal ideas, hallucinations, behavioral problems, confusion, self-injury, dysphoric mood, decreased  concentration and agitation. The patient is nervous/anxious. The patient is not hyperactive.        Objective:   Physical Exam  Vitals reviewed. Constitutional: She is oriented to person, place, and time. She appears well-developed and well-nourished. No distress.  HENT:  Head: Atraumatic.  Mouth/Throat: Oropharynx is clear and moist.  Eyes: Conjunctivae are normal. Right eye exhibits no discharge. Left eye exhibits no discharge. No scleral icterus.  Neck: Normal range of motion. Neck supple. No JVD present. No tracheal deviation present. No thyromegaly present.  Cardiovascular: Normal rate, regular rhythm, normal heart sounds and intact distal pulses.  Exam reveals no gallop and no friction rub.   No murmur heard. Pulmonary/Chest: Effort normal and breath sounds normal. No stridor. No respiratory distress. She has no wheezes. She has no rales. She exhibits no tenderness.  Abdominal: Soft. Bowel sounds are normal. She exhibits no distension and no mass. There is no tenderness. There is no rebound and no guarding.  Musculoskeletal: Normal range of motion. She exhibits no edema and no tenderness.  Lymphadenopathy:    She has no cervical adenopathy.  Neurological: She is oriented to person, place, and time.  Skin: Skin is warm and dry. No rash noted. She is not diaphoretic. No erythema. No pallor.  Psychiatric: She has a normal mood and affect. Her behavior is normal. Judgment and thought content normal.     Lab Results  Component Value Date   WBC 10.0 11/01/2013   HGB 12.0 11/01/2013   HCT 36.9 11/01/2013   PLT 255 11/01/2013   GLUCOSE 84 11/01/2013   CHOL 210* 07/19/2013   TRIG 204.0* 07/19/2013   HDL 32.30* 07/19/2013   LDLDIRECT 151.2 07/19/2013   LDLCALC 105* 03/03/2012   ALT 21 07/19/2013   AST 28 07/19/2013   NA 136 11/01/2013   K 3.6 11/01/2013   CL 98 11/01/2013   CREATININE 0.60 11/01/2013  BUN 13 11/01/2013   CO2 25 11/01/2013   TSH 0.86 07/19/2013   INR 0.96 08/28/2011   HGBA1C  6.2 07/19/2013       Assessment & Plan:

## 2014-07-31 NOTE — Assessment & Plan Note (Signed)
In addition to lifestyle modifications she will try phentermine

## 2014-07-31 NOTE — Assessment & Plan Note (Signed)
Her BP is adequately well controlled off of meds She will improve her lifestyle modifications Will cont to monitor her BP

## 2014-07-31 NOTE — Assessment & Plan Note (Signed)
Cont xanax as needed 

## 2014-07-31 NOTE — Assessment & Plan Note (Signed)
Exam done Vaccines were updated Labs ordered Pt ed material was given 

## 2014-07-31 NOTE — Assessment & Plan Note (Signed)
She will cont ambien as needed 

## 2014-08-20 ENCOUNTER — Other Ambulatory Visit: Payer: Self-pay | Admitting: Internal Medicine

## 2014-08-20 ENCOUNTER — Encounter: Payer: Self-pay | Admitting: Internal Medicine

## 2014-08-20 DIAGNOSIS — G4700 Insomnia, unspecified: Secondary | ICD-10-CM

## 2014-08-20 MED ORDER — ZOLPIDEM TARTRATE 10 MG PO TABS: 10.0000 mg | ORAL_TABLET | Freq: Every evening | ORAL | Status: DC | PRN

## 2014-08-21 NOTE — Telephone Encounter (Signed)
Ok to forward to Dr Yetta Barre

## 2014-09-10 ENCOUNTER — Other Ambulatory Visit: Payer: Self-pay | Admitting: Women's Health

## 2014-09-27 ENCOUNTER — Encounter: Payer: Self-pay | Admitting: Women's Health

## 2014-09-30 ENCOUNTER — Encounter: Payer: Self-pay | Admitting: Internal Medicine

## 2014-10-22 ENCOUNTER — Other Ambulatory Visit: Payer: Self-pay | Admitting: Internal Medicine

## 2014-11-04 ENCOUNTER — Other Ambulatory Visit: Payer: Self-pay | Admitting: Internal Medicine

## 2014-11-04 MED ORDER — LINACLOTIDE 145 MCG PO CAPS: 145.0000 ug | ORAL_CAPSULE | Freq: Every day | ORAL | Status: DC

## 2015-02-13 ENCOUNTER — Other Ambulatory Visit: Payer: Self-pay | Admitting: Internal Medicine

## 2015-02-14 NOTE — Telephone Encounter (Signed)
Faxed script back to [facility].../lmb 

## 2015-02-14 NOTE — Telephone Encounter (Signed)
MD is out of office pls advise on refill.../lmb 

## 2015-03-11 ENCOUNTER — Other Ambulatory Visit: Payer: Self-pay | Admitting: Internal Medicine

## 2015-03-20 ENCOUNTER — Encounter: Payer: Self-pay | Admitting: Internal Medicine

## 2015-03-20 ENCOUNTER — Ambulatory Visit (INDEPENDENT_AMBULATORY_CARE_PROVIDER_SITE_OTHER): Payer: BLUE CROSS/BLUE SHIELD | Admitting: Internal Medicine

## 2015-03-20 VITALS — BP 128/84 | HR 88 | Temp 98.2°F | Ht 66.0 in | Wt 234.2 lb

## 2015-03-20 DIAGNOSIS — I10 Essential (primary) hypertension: Secondary | ICD-10-CM

## 2015-03-20 DIAGNOSIS — G4700 Insomnia, unspecified: Secondary | ICD-10-CM

## 2015-03-20 MED ORDER — ZOLPIDEM TARTRATE 10 MG PO TABS: 10.0000 mg | ORAL_TABLET | Freq: Every evening | ORAL | Status: DC | PRN

## 2015-03-20 NOTE — Patient Instructions (Signed)
Insomnia Insomnia is frequent trouble falling and/or staying asleep. Insomnia can be a long term problem or a short term problem. Both are common. Insomnia can be a short term problem when the wakefulness is related to a certain stress or worry. Long term insomnia is often related to ongoing stress during waking hours and/or poor sleeping habits. Overtime, sleep deprivation itself can make the problem worse. Every little thing feels more severe because you are overtired and your ability to cope is decreased. CAUSES   Stress, anxiety, and depression.  Poor sleeping habits.  Distractions such as TV in the bedroom.  Naps close to bedtime.  Engaging in emotionally charged conversations before bed.  Technical reading before sleep.  Alcohol and other sedatives. They may make the problem worse. They can hurt normal sleep patterns and normal dream activity.  Stimulants such as caffeine for several hours prior to bedtime.  Pain syndromes and shortness of breath can cause insomnia.  Exercise late at night.  Changing time zones may cause sleeping problems (jet lag). It is sometimes helpful to have someone observe your sleeping patterns. They should look for periods of not breathing during the night (sleep apnea). They should also look to see how long those periods last. If you live alone or observers are uncertain, you can also be observed at a sleep clinic where your sleep patterns will be professionally monitored. Sleep apnea requires a checkup and treatment. Give your caregivers your medical history. Give your caregivers observations your family has made about your sleep.  SYMPTOMS   Not feeling rested in the morning.  Anxiety and restlessness at bedtime.  Difficulty falling and staying asleep. TREATMENT   Your caregiver may prescribe treatment for an underlying medical disorders. Your caregiver can give advice or help if you are using alcohol or other drugs for self-medication. Treatment  of underlying problems will usually eliminate insomnia problems.  Medications can be prescribed for short time use. They are generally not recommended for lengthy use.  Over-the-counter sleep medicines are not recommended for lengthy use. They can be habit forming.  You can promote easier sleeping by making lifestyle changes such as:  Using relaxation techniques that help with breathing and reduce muscle tension.  Exercising earlier in the day.  Changing your diet and the time of your last meal. No night time snacks.  Establish a regular time to go to bed.  Counseling can help with stressful problems and worry.  Soothing music and white noise may be helpful if there are background noises you cannot remove.  Stop tedious detailed work at least one hour before bedtime. HOME CARE INSTRUCTIONS   Keep a diary. Inform your caregiver about your progress. This includes any medication side effects. See your caregiver regularly. Take note of:  Times when you are asleep.  Times when you are awake during the night.  The quality of your sleep.  How you feel the next day. This information will help your caregiver care for you.  Get out of bed if you are still awake after 15 minutes. Read or do some quiet activity. Keep the lights down. Wait until you feel sleepy and go back to bed.  Keep regular sleeping and waking hours. Avoid naps.  Exercise regularly.  Avoid distractions at bedtime. Distractions include watching television or engaging in any intense or detailed activity like attempting to balance the household checkbook.  Develop a bedtime ritual. Keep a familiar routine of bathing, brushing your teeth, climbing into bed at the same   time each night, listening to soothing music. Routines increase the success of falling to sleep faster.  Use relaxation techniques. This can be using breathing and muscle tension release routines. It can also include visualizing peaceful scenes. You can  also help control troubling or intruding thoughts by keeping your mind occupied with boring or repetitive thoughts like the old concept of counting sheep. You can make it more creative like imagining planting one beautiful flower after another in your backyard garden.  During your day, work to eliminate stress. When this is not possible use some of the previous suggestions to help reduce the anxiety that accompanies stressful situations. MAKE SURE YOU:   Understand these instructions.  Will watch your condition.  Will get help right away if you are not doing well or get worse. Document Released: 11/12/2000 Document Revised: 02/07/2012 Document Reviewed: 12/13/2007 [employer] Patient Information 2015 [employer]. This information is not intended to replace advice given to you by your health care provider. Make sure you discuss any questions you have with your health care provider.  

## 2015-03-20 NOTE — Progress Notes (Signed)
Pre visit review using our clinic review tool, if applicable. No additional management support is needed unless otherwise documented below in the visit note. 

## 2015-03-22 NOTE — Assessment & Plan Note (Signed)
She will cont ambien as needed 

## 2015-03-22 NOTE — Progress Notes (Signed)
   Subjective:    Patient ID: Faith Sellers, female    DOB: 02/23/1976, 39 y.o.   MRN: [id number]  HPI Comments: [place], she feels well and offers no new complaints.     Review of Systems  Constitutional: Positive for fatigue. Negative for fever, chills, diaphoresis and appetite change.  HENT: Negative.   Eyes: Negative.   Respiratory: Negative.  Negative for cough, choking, chest tightness and shortness of breath.   Cardiovascular: Negative.  Negative for chest pain, palpitations and leg swelling.  Gastrointestinal: Negative.  Negative for nausea, abdominal pain, diarrhea and constipation.  Endocrine: Negative.   Genitourinary: Negative.   Musculoskeletal: Negative.  Negative for back pain, joint swelling and arthralgias.  Skin: Negative.   Allergic/Immunologic: Negative.   Neurological: Negative.   Hematological: Negative.  Negative for adenopathy. Does not bruise/bleed easily.  Psychiatric/Behavioral: Positive for sleep disturbance. Negative for suicidal ideas, hallucinations, behavioral problems, confusion, self-injury, dysphoric mood and decreased concentration. The patient is nervous/anxious. The patient is not hyperactive.        Objective:   Physical Exam  Constitutional: She is oriented to person, place, and time. She appears well-developed and well-nourished. No distress.  HENT:  Head: Normocephalic and atraumatic.  Mouth/Throat: Oropharynx is clear and moist. No oropharyngeal exudate.  Eyes: Conjunctivae are normal. Right eye exhibits no discharge. Left eye exhibits no discharge. No scleral icterus.  Neck: Normal range of motion. Neck supple. No JVD present. No tracheal deviation present. No thyromegaly present.  Cardiovascular: Normal rate, regular rhythm, normal heart sounds and intact distal pulses.  Exam reveals no gallop and no friction rub.   No murmur heard. Pulmonary/Chest: Effort normal and breath sounds normal. No stridor. No  respiratory distress. She has no wheezes. She has no rales. She exhibits no tenderness.  Abdominal: Soft. Bowel sounds are normal. She exhibits no distension and no mass. There is no tenderness. There is no rebound and no guarding.  Musculoskeletal: Normal range of motion. She exhibits no edema or tenderness.  Lymphadenopathy:    She has no cervical adenopathy.  Neurological: She is oriented to person, place, and time.  Skin: Skin is warm and dry. No rash noted. She is not diaphoretic. No erythema. No pallor.  Vitals reviewed.   Lab Results  Component Value Date   WBC 7.2 07/30/2014   HGB 11.7* 07/30/2014   HCT 36.5 07/30/2014   PLT 321.0 07/30/2014   GLUCOSE 99 07/30/2014   CHOL 197 07/30/2014   TRIG 152.0* 07/30/2014   HDL 34.10* 07/30/2014   LDLDIRECT 151.2 07/19/2013   LDLCALC 133* 07/30/2014   ALT 23 07/30/2014   AST 25 07/30/2014   NA 138 07/30/2014   K 3.4* 07/30/2014   CL 102 07/30/2014   CREATININE 0.6 07/30/2014   BUN 8 07/30/2014   CO2 28 07/30/2014   TSH 0.97 07/30/2014   INR 0.96 08/28/2011   HGBA1C 6.2 07/19/2013        Assessment & Plan:

## 2015-03-22 NOTE — Assessment & Plan Note (Signed)
Her BP is well controlled 

## 2015-04-29 ENCOUNTER — Other Ambulatory Visit: Payer: Self-pay | Admitting: Internal Medicine

## 2015-04-29 ENCOUNTER — Encounter: Payer: Self-pay | Admitting: Internal Medicine

## 2015-04-29 MED ORDER — TRIAMCINOLONE ACETONIDE 0.5 % EX CREA: 1.0000 "application " | TOPICAL_CREAM | Freq: Three times a day (TID) | CUTANEOUS | Status: DC

## 2015-05-03 IMAGING — CR DG CHEST 2V
2 series · 2 of 2 positions shown · non-contrast
Comparison: 08/28/2011

CLINICAL DATA: Palpitations, left chest pain, history hypertension,
hypercholesterolemia

EXAM:
CHEST  2 VIEW

[w chest pa]
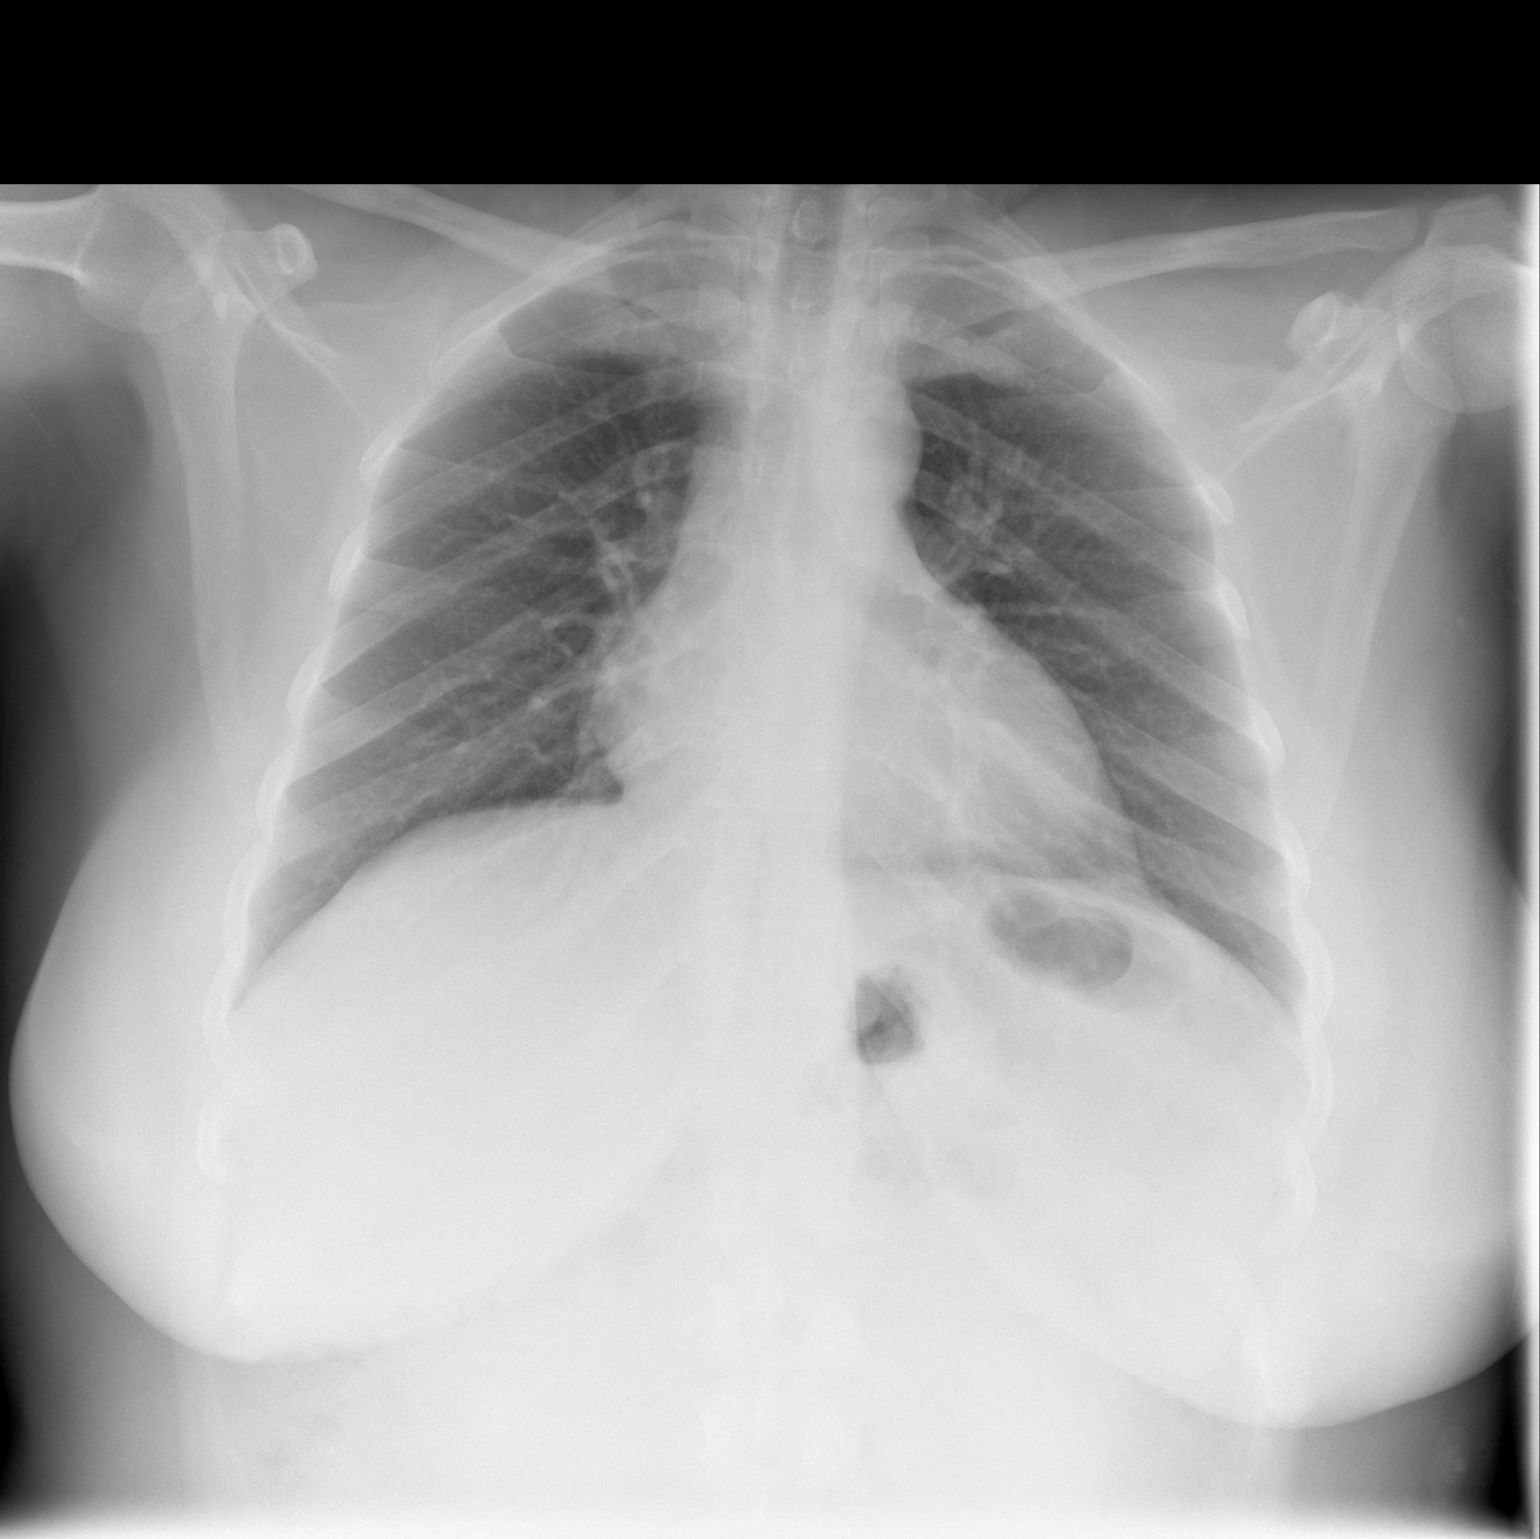

[w chest lat]
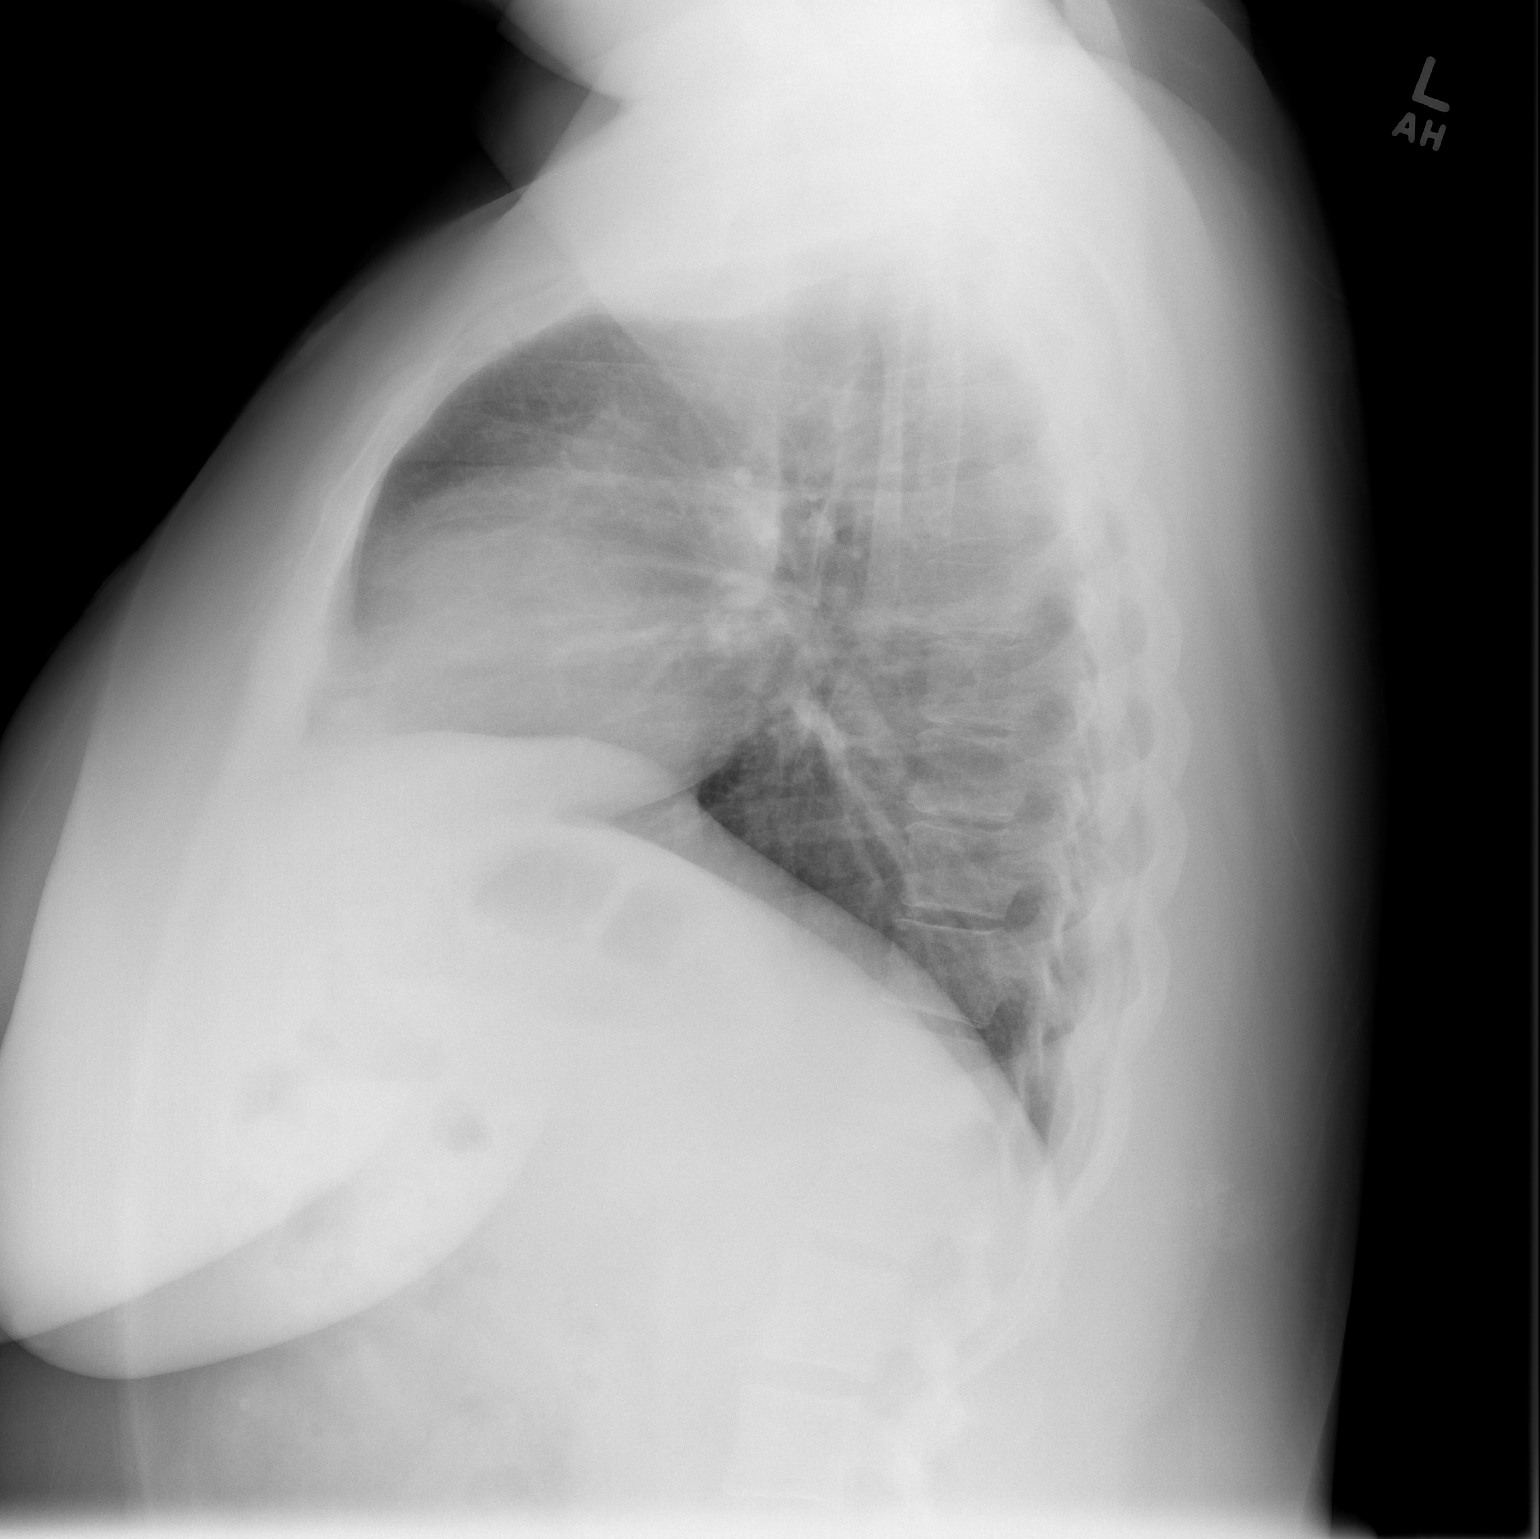

[2 of 2 positions shown; findings below may reference images not displayed]

FINDINGS: Upper normal heart size.

Mediastinal contours and pulmonary vascularity normal.

Lungs clear.

No pleural effusion or pneumothorax.

Bones unremarkable.
IMPRESSION: No acute abnormalities.

## 2015-09-11 ENCOUNTER — Encounter: Payer: Self-pay | Admitting: Internal Medicine

## 2015-09-11 ENCOUNTER — Ambulatory Visit (INDEPENDENT_AMBULATORY_CARE_PROVIDER_SITE_OTHER): Payer: BLUE CROSS/BLUE SHIELD | Admitting: Internal Medicine

## 2015-09-11 ENCOUNTER — Other Ambulatory Visit (INDEPENDENT_AMBULATORY_CARE_PROVIDER_SITE_OTHER): Payer: BLUE CROSS/BLUE SHIELD

## 2015-09-11 VITALS — BP 124/88 | HR 70 | Temp 98.1°F | Resp 16 | Ht 66.0 in | Wt 233.0 lb

## 2015-09-11 DIAGNOSIS — D510 Vitamin B12 deficiency anemia due to intrinsic factor deficiency: Secondary | ICD-10-CM

## 2015-09-11 DIAGNOSIS — R7309 Other abnormal glucose: Secondary | ICD-10-CM

## 2015-09-11 DIAGNOSIS — D509 Iron deficiency anemia, unspecified: Secondary | ICD-10-CM | POA: Insufficient documentation

## 2015-09-11 DIAGNOSIS — Z0000 Encounter for general adult medical examination without abnormal findings: Secondary | ICD-10-CM | POA: Diagnosis not present

## 2015-09-11 DIAGNOSIS — G4700 Insomnia, unspecified: Secondary | ICD-10-CM | POA: Diagnosis not present

## 2015-09-11 LAB — LIPID PANEL
CHOL/HDL RATIO: 6
Cholesterol: 207 mg/dL — ABNORMAL HIGH (ref 0–200)
HDL: 35.7 mg/dL — AB (ref >39.00)
LDL CALC: 136 mg/dL — AB (ref 0–99)
NonHDL: 171.32
TRIGLYCERIDES: 175 mg/dL — AB (ref 0.0–149.0)
VLDL: 35 mg/dL (ref 0.0–40.0)

## 2015-09-11 LAB — CBC WITH DIFFERENTIAL/PLATELET
Hemoglobin: 8.1 g/dL — ABNORMAL LOW (ref 12.0–15.0)
MCHC: 29.3 g/dL — ABNORMAL LOW (ref 30.0–36.0)
MCV: 58.6 fl — ABNORMAL LOW (ref 78.0–100.0)
PLATELETS: 281 10*3/uL (ref 150.0–400.0)
RBC: 4.7 Mil/uL (ref 3.87–5.11)
RDW: 20.6 % — ABNORMAL HIGH (ref 11.5–15.5)
WBC: 7.2 10*3/uL (ref 4.0–10.5)

## 2015-09-11 LAB — COMPREHENSIVE METABOLIC PANEL
ALT: 17 U/L (ref 0–35)
AST: 18 U/L (ref 0–37)
Albumin: 3.7 g/dL (ref 3.5–5.2)
Alkaline Phosphatase: 77 U/L (ref 39–117)
BILIRUBIN TOTAL: 0.6 mg/dL (ref 0.2–1.2)
BUN: 10 mg/dL (ref 6–23)
CALCIUM: 8.9 mg/dL (ref 8.4–10.5)
CHLORIDE: 103 meq/L (ref 96–112)
CO2: 30 meq/L (ref 19–32)
Creatinine, Ser: 0.62 mg/dL (ref 0.40–1.20)
GFR: 137.15 mL/min (ref >60.00)
Glucose, Bld: 93 mg/dL (ref 70–99)
Potassium: 3.7 mEq/L (ref 3.5–5.1)
Sodium: 139 mEq/L (ref 135–145)
Total Protein: 7.6 g/dL (ref 6.0–8.3)

## 2015-09-11 LAB — IBC PANEL
IRON: 21 ug/dL — AB (ref 42–145)
Saturation Ratios: 3.7 % — ABNORMAL LOW (ref 20.0–50.0)
Transferrin: 409 mg/dL — ABNORMAL HIGH (ref 212.0–360.0)

## 2015-09-11 LAB — HEMOGLOBIN A1C: HEMOGLOBIN A1C: 6 % (ref 4.6–6.5)

## 2015-09-11 LAB — TSH: TSH: 0.95 u[IU]/mL (ref 0.35–4.50)

## 2015-09-11 LAB — FERRITIN: Ferritin: 3.4 ng/mL — ABNORMAL LOW (ref 10.0–291.0)

## 2015-09-11 LAB — VITAMIN B12: VITAMIN B 12: 488 pg/mL (ref 211–911)

## 2015-09-11 LAB — FOLATE: Folate: 15.9 ng/mL (ref >5.9)

## 2015-09-11 MED ORDER — FERRALET 90 90-1 MG PO TABS: 1.0000 | ORAL_TABLET | Freq: Every day | ORAL | Status: DC

## 2015-09-11 MED ORDER — ZOLPIDEM TARTRATE 10 MG PO TABS: 10.0000 mg | ORAL_TABLET | Freq: Every evening | ORAL | Status: DC | PRN

## 2015-09-11 NOTE — Progress Notes (Signed)
Subjective:  Patient ID: Faith BladesStacy Soliz, female    DOB: 08/09/1976  Age: 39 y.o. MRN: [id number]  CC: Asthma and Annual Exam   HPI Faith Sellers presents for a CPX as well as evaluation of anemia - she has heavy menstrual cycles and a hx of IDA, she complains of fatigue, SOB, and craving ice.  Outpatient Prescriptions Prior to Visit  Medication Sig Dispense Refill  . ALPRAZolam (XANAX) 0.5 MG tablet     . Linaclotide (LINZESS) 145 MCG CAPS capsule Take 1 capsule (145 mcg total) by mouth daily. 30 capsule 3  . triamcinolone cream (KENALOG) 0.5 % Apply 1 application topically 3 (three) times daily. 30 g 1  . zolpidem (AMBIEN) 10 MG tablet Take 1 tablet (10 mg total) by mouth at bedtime as needed. 30 tablet 5   No facility-administered medications prior to visit.    ROS Review of Systems  Constitutional: Positive for fatigue. Negative for fever, chills, diaphoresis, activity change, appetite change and unexpected weight change.  HENT: Negative.  Negative for nosebleeds, trouble swallowing and voice change.   Eyes: Negative.   Respiratory: Positive for shortness of breath. Negative for choking, chest tightness and stridor.   Cardiovascular: Negative.  Negative for chest pain, palpitations and leg swelling.  Gastrointestinal: Negative.  Negative for nausea, vomiting, abdominal pain, diarrhea, constipation and blood in stool.  Endocrine: Negative.   Genitourinary: Positive for vaginal bleeding and menstrual problem. Negative for vaginal pain and pelvic pain.  Musculoskeletal: Negative.   Skin: Negative.  Negative for color change and rash.  Allergic/Immunologic: Negative.   Neurological: Negative.   Hematological: Negative.  Negative for adenopathy. Does not bruise/bleed easily.  Psychiatric/Behavioral: Positive for sleep disturbance. Negative for suicidal ideas, self-injury, dysphoric mood, decreased concentration and agitation. The patient is nervous/anxious.     Objective:  BP 124/88  mmHg  Pulse 70  Temp(Src) 98.1 F (36.7 C) (Oral)  Resp 16  Ht 5\' 6"  (1.676 m)  Wt 233 lb (105.688 kg)  BMI 37.63 kg/m2  SpO2 99%  LMP 09/11/2015  BP Readings from Last 3 Encounters:  09/11/15 124/88  03/20/15 128/84  07/30/14 118/84    Wt Readings from Last 3 Encounters:  09/11/15 233 lb (105.688 kg)  03/20/15 234 lb 3 oz (106.227 kg)  07/30/14 230 lb (104.327 kg)    Physical Exam  Constitutional: She is oriented to person, place, and time. She appears well-developed and well-nourished. No distress.  HENT:  Head: Normocephalic and atraumatic.  Mouth/Throat: Oropharynx is clear and moist. Mucous membranes are pale, not dry and not cyanotic. No oropharyngeal exudate, posterior oropharyngeal erythema or tonsillar abscesses.  Eyes: Conjunctivae are normal. Right eye exhibits no discharge. Left eye exhibits no discharge. No scleral icterus.  Neck: Normal range of motion. Neck supple. No JVD present. No tracheal deviation present. No thyromegaly present.  Cardiovascular: Normal rate, regular rhythm, normal heart sounds and intact distal pulses.  Exam reveals no gallop and no friction rub.   No murmur heard. Pulmonary/Chest: Effort normal and breath sounds normal. No stridor. No respiratory distress. She has no wheezes. She has no rales. She exhibits no tenderness.  Abdominal: Soft. Bowel sounds are normal. She exhibits no distension and no mass. There is no tenderness. There is no rebound and no guarding.  Musculoskeletal: She exhibits no edema or tenderness.  Lymphadenopathy:    She has no cervical adenopathy.  Neurological: She is oriented to person, place, and time.  Skin: Skin is warm and dry. No rash  noted. She is not diaphoretic. No erythema. No pallor.  Psychiatric: She has a normal mood and affect. Her behavior is normal. Judgment and thought content normal.  Vitals reviewed.   Lab Results  Component Value Date   WBC 7.2 09/11/2015   HGB 8.1 Repeated and verified X2.*  09/11/2015   HCT 27.3 Repeated and verified X2.* 09/11/2015   PLT 281.0 09/11/2015   GLUCOSE 93 09/11/2015   CHOL 207* 09/11/2015   TRIG 175.0* 09/11/2015   HDL 35.70* 09/11/2015   LDLDIRECT 151.2 07/19/2013   LDLCALC 136* 09/11/2015   ALT 17 09/11/2015   AST 18 09/11/2015   NA 139 09/11/2015   K 3.7 09/11/2015   CL 103 09/11/2015   CREATININE 0.62 09/11/2015   BUN 10 09/11/2015   CO2 30 09/11/2015   TSH 0.95 09/11/2015   INR 0.96 08/28/2011   HGBA1C 6.0 09/11/2015    Dg Chest 2 View  11/01/2013  CLINICAL DATA:  Palpitations, left chest pain, history hypertension, hypercholesterolemia EXAM: CHEST  2 VIEW COMPARISON:  08/28/2011 FINDINGS: Upper normal heart size. Mediastinal contours and pulmonary vascularity normal. Lungs clear. No pleural effusion or pneumothorax. Bones unremarkable. IMPRESSION: No acute abnormalities. Electronically Signed   By: Ulyses Southward M.D.   On: 11/01/2013 18:41    Assessment & Plan:   Brittnay was seen today for asthma and annual exam.  Diagnoses and all orders for this visit:  Other abnormal glucose- she has prediabetes and will continue to work on her lifestyle modifications -     Hemoglobin A1c; Future  Routine general medical examination at a health care facility- PAP is UTD, exam done, labs reviewed, vaccines were reviewed and updated, pt ed material was given. -     Lipid panel; Future -     Comprehensive metabolic panel; Future -     CBC with Differential/Platelet; Future -     TSH; Future  Insomnia -     zolpidem (AMBIEN) 10 MG tablet; Take 1 tablet (10 mg total) by mouth at bedtime as needed.  Pernicious anemia- she has a moderately severe anemia with iron deficiency, will treat with ferralet, she will see her GYN about her heavy periods -     IBC panel; Future -     Vitamin B12; Future -     Ferritin; Future -     Folate; Future -     [name] (FERRALET 90) 90-1 MG TABS; Take 1 tablet by mouth daily.  I am having  Ms. Tosh start on FERRALET 90. I am also having her maintain her ALPRAZolam, Linaclotide, triamcinolone cream, ALPRAZolam, and zolpidem.  Meds ordered this encounter  Medications  . ALPRAZolam (XANAX) 1 MG tablet    Sig: TK 1 T PO QID PRN    Refill:  5  . zolpidem (AMBIEN) 10 MG tablet    Sig: Take 1 tablet (10 mg total) by mouth at bedtime as needed.    Dispense:  30 tablet    Refill:  5  . [name] (FERRALET 90) 90-1 MG TABS    Sig: Take 1 tablet by mouth daily.    Dispense:  30 each    Refill:  11     Follow-up: Return in about 3 months (around 12/12/2015).  Sanda Linger, MD

## 2015-09-11 NOTE — Patient Instructions (Signed)
Preventive Care for Adults, Female A healthy lifestyle and preventive care can promote health and wellness. Preventive health guidelines for women include the following key practices.  A routine yearly physical is a good way to check with your health care provider about your health and preventive screening. It is a chance to share any concerns and updates on your health and to receive a thorough exam.  Visit your dentist for a routine exam and preventive care every 6 months. Brush your teeth twice a day and floss once a day. Good oral hygiene prevents tooth decay and gum disease.  The frequency of eye exams is based on your age, health, family medical history, use of contact lenses, and other factors. Follow your health care provider's recommendations for frequency of eye exams.  Eat a healthy diet. Foods like vegetables, fruits, whole grains, low-fat dairy products, and lean protein foods contain the nutrients you need without too many calories. Decrease your intake of foods high in solid fats, added sugars, and salt. Eat the right amount of calories for you.Get information about a proper diet from your health care provider, if necessary.  Regular physical exercise is one of the most important things you can do for your health. Most adults should get at least 150 minutes of moderate-intensity exercise (any activity that increases your heart rate and causes you to sweat) each week. In addition, most adults need muscle-strengthening exercises on 2 or more days a week.  Maintain a healthy weight. The body mass index (BMI) is a screening tool to identify possible weight problems. It provides an estimate of body fat based on height and weight. Your health care provider can find your BMI and can help you achieve or maintain a healthy weight.For adults 20 years and older:  A BMI below 18.5 is considered underweight.  A BMI of 18.5 to 24.9 is normal.  A BMI of 25 to 29.9 is considered overweight.  A  BMI of 30 and above is considered obese.  Maintain normal blood lipids and cholesterol levels by exercising and minimizing your intake of saturated fat. Eat a balanced diet with plenty of fruit and vegetables. Blood tests for lipids and cholesterol should begin at age 45 and be repeated every 5 years. If your lipid or cholesterol levels are high, you are over 50, or you are at high risk for heart disease, you may need your cholesterol levels checked more frequently.Ongoing high lipid and cholesterol levels should be treated with medicines if diet and exercise are not working.  If you smoke, find out from your health care provider how to quit. If you do not use tobacco, do not start.  Lung cancer screening is recommended for adults aged 45-80 years who are at high risk for developing lung cancer because of a history of smoking. A yearly low-dose CT scan of the lungs is recommended for people who have at least a 30-pack-year history of smoking and are a current smoker or have quit within the past 15 years. A pack year of smoking is smoking an average of 1 pack of cigarettes a day for 1 year (for example: 1 pack a day for 30 years or 2 packs a day for 15 years). Yearly screening should continue until the smoker has stopped smoking for at least 15 years. Yearly screening should be stopped for people who develop a health problem that would prevent them from having lung cancer treatment.  If you are pregnant, do not drink alcohol. If you are  breastfeeding, be very cautious about drinking alcohol. If you are not pregnant and choose to drink alcohol, do not have more than 1 drink per day. One drink is considered to be 12 ounces (355 mL) of beer, 5 ounces (148 mL) of wine, or 1.5 ounces (44 mL) of liquor.  Avoid use of street drugs. Do not share needles with anyone. Ask for help if you need support or instructions about stopping the use of drugs.  High blood pressure causes heart disease and increases the risk  of stroke. Your blood pressure should be checked at least every 1 to 2 years. Ongoing high blood pressure should be treated with medicines if weight loss and exercise do not work.  If you are 55-79 years old, ask your health care provider if you should take aspirin to prevent strokes.  Diabetes screening is done by taking a blood sample to check your blood glucose level after you have not eaten for a certain period of time (fasting). If you are not overweight and you do not have risk factors for diabetes, you should be screened once every 3 years starting at age 45. If you are overweight or obese and you are 40-70 years of age, you should be screened for diabetes every year as part of your cardiovascular risk assessment.  Breast cancer screening is essential preventive care for women. You should practice "breast self-awareness." This means understanding the normal appearance and feel of your breasts and may include breast self-examination. Any changes detected, no matter how small, should be reported to a health care provider. Women in their 20s and 30s should have a clinical breast exam (CBE) by a health care provider as part of a regular health exam every 1 to 3 years. After age 40, women should have a CBE every year. Starting at age 40, women should consider having a mammogram (breast X-ray test) every year. Women who have a family history of breast cancer should talk to their health care provider about genetic screening. Women at a high risk of breast cancer should talk to their health care providers about having an MRI and a mammogram every year.  Breast cancer gene (BRCA)-related cancer risk assessment is recommended for women who have family members with BRCA-related cancers. BRCA-related cancers include breast, ovarian, tubal, and peritoneal cancers. Having family members with these cancers may be associated with an increased risk for harmful changes (mutations) in the breast cancer genes BRCA1 and  BRCA2. Results of the assessment will determine the need for genetic counseling and BRCA1 and BRCA2 testing.  Your health care provider may recommend that you be screened regularly for cancer of the pelvic organs (ovaries, uterus, and vagina). This screening involves a pelvic examination, including checking for microscopic changes to the surface of your cervix (Pap test). You may be encouraged to have this screening done every 3 years, beginning at age 21.  For women ages 30-65, health care providers may recommend pelvic exams and Pap testing every 3 years, or they may recommend the Pap and pelvic exam, combined with testing for human papilloma virus (HPV), every 5 years. Some types of HPV increase your risk of cervical cancer. Testing for HPV may also be done on women of any age with unclear Pap test results.  Other health care providers may not recommend any screening for nonpregnant women who are considered low risk for pelvic cancer and who do not have symptoms. Ask your health care provider if a screening pelvic exam is right for   you.  If you have had past treatment for cervical cancer or a condition that could lead to cancer, you need Pap tests and screening for cancer for at least 20 years after your treatment. If Pap tests have been discontinued, your risk factors (such as having a new sexual partner) need to be reassessed to determine if screening should resume. Some women have medical problems that increase the chance of getting cervical cancer. In these cases, your health care provider may recommend more frequent screening and Pap tests.  Colorectal cancer can be detected and often prevented. Most routine colorectal cancer screening begins at the age of 50 years and continues through age 75 years. However, your health care provider may recommend screening at an earlier age if you have risk factors for colon cancer. On a yearly basis, your health care provider may provide home test kits to check  for hidden blood in the stool. Use of a small camera at the end of a tube, to directly examine the colon (sigmoidoscopy or colonoscopy), can detect the earliest forms of colorectal cancer. Talk to your health care provider about this at age 50, when routine screening begins. Direct exam of the colon should be repeated every 5-10 years through age 75 years, unless early forms of precancerous polyps or small growths are found.  People who are at an increased risk for hepatitis B should be screened for this virus. You are considered at high risk for hepatitis B if:  You were born in a country where hepatitis B occurs often. Talk with your health care provider about which countries are considered high risk.  Your parents were born in a high-risk country and you have not received a shot to protect against hepatitis B (hepatitis B vaccine).  You have HIV or AIDS.  You use needles to inject street drugs.  You live with, or have sex with, someone who has hepatitis B.  You get hemodialysis treatment.  You take certain medicines for conditions like cancer, organ transplantation, and autoimmune conditions.  Hepatitis C blood testing is recommended for all people born from 1945 through 1965 and any individual with known risks for hepatitis C.  Practice safe sex. Use condoms and avoid high-risk sexual practices to reduce the spread of sexually transmitted infections (STIs). STIs include gonorrhea, chlamydia, syphilis, trichomonas, herpes, HPV, and human immunodeficiency virus (HIV). Herpes, HIV, and HPV are viral illnesses that have no cure. They can result in disability, cancer, and death.  You should be screened for sexually transmitted illnesses (STIs) including gonorrhea and chlamydia if:  You are sexually active and are younger than 24 years.  You are older than 24 years and your health care provider tells you that you are at risk for this type of infection.  Your sexual activity has changed  since you were last screened and you are at an increased risk for chlamydia or gonorrhea. Ask your health care provider if you are at risk.  If you are at risk of being infected with HIV, it is recommended that you take a prescription medicine daily to prevent HIV infection. This is called preexposure prophylaxis (PrEP). You are considered at risk if:  You are sexually active and do not regularly use condoms or know the HIV status of your partner(s).  You take drugs by injection.  You are sexually active with a partner who has HIV.  Talk with your health care provider about whether you are at high risk of being infected with HIV. If   you choose to begin PrEP, you should first be tested for HIV. You should then be tested every 3 months for as long as you are taking PrEP.  Osteoporosis is a disease in which the bones lose minerals and strength with aging. This can result in serious bone fractures or breaks. The risk of osteoporosis can be identified using a bone density scan. Women ages 67 years and over and women at risk for fractures or osteoporosis should discuss screening with their health care providers. Ask your health care provider whether you should take a calcium supplement or vitamin D to reduce the rate of osteoporosis.  Menopause can be associated with physical symptoms and risks. Hormone replacement therapy is available to decrease symptoms and risks. You should talk to your health care provider about whether hormone replacement therapy is right for you.  Use sunscreen. Apply sunscreen liberally and repeatedly throughout the day. You should seek shade when your shadow is shorter than you. Protect yourself by wearing long sleeves, pants, a wide-brimmed hat, and sunglasses year round, whenever you are outdoors.  Once a month, do a whole body skin exam, using a mirror to look at the skin on your back. Tell your health care provider of new moles, moles that have irregular borders, moles that  are larger than a pencil eraser, or moles that have changed in shape or color.  Stay current with required vaccines (immunizations).  Influenza vaccine. All adults should be immunized every year.  Tetanus, diphtheria, and acellular pertussis (Td, Tdap) vaccine. Pregnant women should receive 1 dose of Tdap vaccine during each pregnancy. The dose should be obtained regardless of the length of time since the last dose. Immunization is preferred during the 27th-36th week of gestation. An adult who has not previously received Tdap or who does not know her vaccine status should receive 1 dose of Tdap. This initial dose should be followed by tetanus and diphtheria toxoids (Td) booster doses every 10 years. Adults with an unknown or incomplete history of completing a 3-dose immunization series with Td-containing vaccines should begin or complete a primary immunization series including a Tdap dose. Adults should receive a Td booster every 10 years.  Varicella vaccine. An adult without evidence of immunity to varicella should receive 2 doses or a second dose if she has previously received 1 dose. Pregnant females who do not have evidence of immunity should receive the first dose after pregnancy. This first dose should be obtained before leaving the health care facility. The second dose should be obtained 4-8 weeks after the first dose.  Human papillomavirus (HPV) vaccine. Females aged 13-26 years who have not received the vaccine previously should obtain the 3-dose series. The vaccine is not recommended for use in pregnant females. However, pregnancy testing is not needed before receiving a dose. If a female is found to be pregnant after receiving a dose, no treatment is needed. In that case, the remaining doses should be delayed until after the pregnancy. Immunization is recommended for any person with an immunocompromised condition through the age of 61 years if she did not get any or all doses earlier. During the  3-dose series, the second dose should be obtained 4-8 weeks after the first dose. The third dose should be obtained 24 weeks after the first dose and 16 weeks after the second dose.  Zoster vaccine. One dose is recommended for adults aged 30 years or older unless certain conditions are present.  Measles, mumps, and rubella (MMR) vaccine. Adults born  before 1957 generally are considered immune to measles and mumps. Adults born in 1957 or later should have 1 or more doses of MMR vaccine unless there is a contraindication to the vaccine or there is laboratory evidence of immunity to each of the three diseases. A routine second dose of MMR vaccine should be obtained at least 28 days after the first dose for students attending postsecondary schools, health care workers, or international travelers. People who received inactivated measles vaccine or an unknown type of measles vaccine during 1963-1967 should receive 2 doses of MMR vaccine. People who received inactivated mumps vaccine or an unknown type of mumps vaccine before 1979 and are at high risk for mumps infection should consider immunization with 2 doses of MMR vaccine. For females of childbearing age, rubella immunity should be determined. If there is no evidence of immunity, females who are not pregnant should be vaccinated. If there is no evidence of immunity, females who are pregnant should delay immunization until after pregnancy. Unvaccinated health care workers born before 1957 who lack laboratory evidence of measles, mumps, or rubella immunity or laboratory confirmation of disease should consider measles and mumps immunization with 2 doses of MMR vaccine or rubella immunization with 1 dose of MMR vaccine.  Pneumococcal 13-valent conjugate (PCV13) vaccine. When indicated, a person who is uncertain of his immunization history and has no record of immunization should receive the PCV13 vaccine. All adults 65 years of age and older should receive this  vaccine. An adult aged 19 years or older who has certain medical conditions and has not been previously immunized should receive 1 dose of PCV13 vaccine. This PCV13 should be followed with a dose of pneumococcal polysaccharide (PPSV23) vaccine. Adults who are at high risk for pneumococcal disease should obtain the PPSV23 vaccine at least 8 weeks after the dose of PCV13 vaccine. Adults older than 40 years of age who have normal immune system function should obtain the PPSV23 vaccine dose at least 1 year after the dose of PCV13 vaccine.  Pneumococcal polysaccharide (PPSV23) vaccine. When PCV13 is also indicated, PCV13 should be obtained first. All adults aged 65 years and older should be immunized. An adult younger than age 65 years who has certain medical conditions should be immunized. Any person who resides in a nursing home or long-term care facility should be immunized. An adult smoker should be immunized. People with an immunocompromised condition and certain other conditions should receive both PCV13 and PPSV23 vaccines. People with human immunodeficiency virus (HIV) infection should be immunized as soon as possible after diagnosis. Immunization during chemotherapy or radiation therapy should be avoided. Routine use of PPSV23 vaccine is not recommended for American Indians, [place] Natives, or people younger than 65 years unless there are medical conditions that require PPSV23 vaccine. When indicated, people who have unknown immunization and have no record of immunization should receive PPSV23 vaccine. One-time revaccination 5 years after the first dose of PPSV23 is recommended for people aged 19-64 years who have chronic kidney failure, nephrotic syndrome, asplenia, or immunocompromised conditions. People who received 1-2 doses of PPSV23 before age 65 years should receive another dose of PPSV23 vaccine at age 65 years or later if at least 5 years have passed since the previous dose. Doses of PPSV23 are not  needed for people immunized with PPSV23 at or after age 65 years.  Meningococcal vaccine. Adults with asplenia or persistent complement component deficiencies should receive 2 doses of quadrivalent meningococcal conjugate (MenACWY-D) vaccine. The doses should be obtained   at least 2 months apart. Microbiologists working with certain meningococcal bacteria, [place] recruits, people at risk during an outbreak, and people who travel to or live in countries with a high rate of meningitis should be immunized. A first-year college student up through age 34 years who is living in a residence hall should receive a dose if she did not receive a dose on or after her 16th birthday. Adults who have certain high-risk conditions should receive one or more doses of vaccine.  Hepatitis A vaccine. Adults who wish to be protected from this disease, have certain high-risk conditions, work with hepatitis A-infected animals, work in hepatitis A research labs, or travel to or work in countries with a high rate of hepatitis A should be immunized. Adults who were previously unvaccinated and who anticipate close contact with an international adoptee during the first 60 days after arrival in the [place] from a country with a high rate of hepatitis A should be immunized.  Hepatitis B vaccine. Adults who wish to be protected from this disease, have certain high-risk conditions, may be exposed to blood or other infectious body fluids, are household contacts or sex partners of hepatitis B positive people, are clients or workers in certain care facilities, or travel to or work in countries with a high rate of hepatitis B should be immunized.  Haemophilus influenzae type b (Hib) vaccine. A previously unvaccinated person with asplenia or sickle cell disease or having a scheduled splenectomy should receive 1 dose of Hib vaccine. Regardless of previous immunization, a recipient of a hematopoietic stem cell transplant should receive a  3-dose series 6-12 months after her successful transplant. Hib vaccine is not recommended for adults with HIV infection. Preventive Services / Frequency Ages 35 to 4 years  Blood pressure check.** / Every 3-5 years.  Lipid and cholesterol check.** / Every 5 years beginning at age 60.  Clinical breast exam.** / Every 3 years for women in their 71s and 10s.  BRCA-related cancer risk assessment.** / For women who have family members with a BRCA-related cancer (breast, ovarian, tubal, or peritoneal cancers).  Pap test.** / Every 2 years from ages 76 through 26. Every 3 years starting at age 61 through age 76 or 93 with a history of 3 consecutive normal Pap tests.  HPV screening.** / Every 3 years from ages 37 through ages 60 to 51 with a history of 3 consecutive normal Pap tests.  Hepatitis C blood test.** / For any individual with known risks for hepatitis C.  Skin self-exam. / Monthly.  Influenza vaccine. / Every year.  Tetanus, diphtheria, and acellular pertussis (Tdap, Td) vaccine.** / Consult your health care provider. Pregnant women should receive 1 dose of Tdap vaccine during each pregnancy. 1 dose of Td every 10 years.  Varicella vaccine.** / Consult your health care provider. Pregnant females who do not have evidence of immunity should receive the first dose after pregnancy.  HPV vaccine. / 3 doses over 6 months, if 93 and younger. The vaccine is not recommended for use in pregnant females. However, pregnancy testing is not needed before receiving a dose.  Measles, mumps, rubella (MMR) vaccine.** / You need at least 1 dose of MMR if you were born in 1957 or later. You may also need a 2nd dose. For females of childbearing age, rubella immunity should be determined. If there is no evidence of immunity, females who are not pregnant should be vaccinated. If there is no evidence of immunity, females who are  pregnant should delay immunization until after pregnancy.  Pneumococcal  13-valent conjugate (PCV13) vaccine.** / Consult your health care provider.  Pneumococcal polysaccharide (PPSV23) vaccine.** / 1 to 2 doses if you smoke cigarettes or if you have certain conditions.  Meningococcal vaccine.** / 1 dose if you are age 68 to 8 years and a Market researcher living in a residence hall, or have one of several medical conditions, you need to get vaccinated against meningococcal disease. You may also need additional booster doses.  Hepatitis A vaccine.** / Consult your health care provider.  Hepatitis B vaccine.** / Consult your health care provider.  Haemophilus influenzae type b (Hib) vaccine.** / Consult your health care provider. Ages 7 to 53 years  Blood pressure check.** / Every year.  Lipid and cholesterol check.** / Every 5 years beginning at age 25 years.  Lung cancer screening. / Every year if you are aged 11-80 years and have a 30-pack-year history of smoking and currently smoke or have quit within the past 15 years. Yearly screening is stopped once you have quit smoking for at least 15 years or develop a health problem that would prevent you from having lung cancer treatment.  Clinical breast exam.** / Every year after age 48 years.  BRCA-related cancer risk assessment.** / For women who have family members with a BRCA-related cancer (breast, ovarian, tubal, or peritoneal cancers).  Mammogram.** / Every year beginning at age 41 years and continuing for as long as you are in good health. Consult with your health care provider.  Pap test.** / Every 3 years starting at age 65 years through age 37 or 70 years with a history of 3 consecutive normal Pap tests.  HPV screening.** / Every 3 years from ages 72 years through ages 60 to 40 years with a history of 3 consecutive normal Pap tests.  Fecal occult blood test (FOBT) of stool. / Every year beginning at age 21 years and continuing until age 5 years. You may not need to do this test if you get  a colonoscopy every 10 years.  Flexible sigmoidoscopy or colonoscopy.** / Every 5 years for a flexible sigmoidoscopy or every 10 years for a colonoscopy beginning at age 35 years and continuing until age 48 years.  Hepatitis C blood test.** / For all people born from [birth year] through 1965 and any individual with known risks for hepatitis C.  Skin self-exam. / Monthly.  Influenza vaccine. / Every year.  Tetanus, diphtheria, and acellular pertussis (Tdap/Td) vaccine.** / Consult your health care provider. Pregnant women should receive 1 dose of Tdap vaccine during each pregnancy. 1 dose of Td every 10 years.  Varicella vaccine.** / Consult your health care provider. Pregnant females who do not have evidence of immunity should receive the first dose after pregnancy.  Zoster vaccine.** / 1 dose for adults aged 30 years or older.  Measles, mumps, rubella (MMR) vaccine.** / You need at least 1 dose of MMR if you were born in 1957 or later. You may also need a second dose. For females of childbearing age, rubella immunity should be determined. If there is no evidence of immunity, females who are not pregnant should be vaccinated. If there is no evidence of immunity, females who are pregnant should delay immunization until after pregnancy.  Pneumococcal 13-valent conjugate (PCV13) vaccine.** / Consult your health care provider.  Pneumococcal polysaccharide (PPSV23) vaccine.** / 1 to 2 doses if you smoke cigarettes or if you have certain conditions.  Meningococcal vaccine.** /  Consult your health care provider.  Hepatitis A vaccine.** / Consult your health care provider.  Hepatitis B vaccine.** / Consult your health care provider.  Haemophilus influenzae type b (Hib) vaccine.** / Consult your health care provider. Ages 64 years and over  Blood pressure check.** / Every year.  Lipid and cholesterol check.** / Every 5 years beginning at age 23 years.  Lung cancer screening. / Every year if you  are aged 16-80 years and have a 30-pack-year history of smoking and currently smoke or have quit within the past 15 years. Yearly screening is stopped once you have quit smoking for at least 15 years or develop a health problem that would prevent you from having lung cancer treatment.  Clinical breast exam.** / Every year after age 74 years.  BRCA-related cancer risk assessment.** / For women who have family members with a BRCA-related cancer (breast, ovarian, tubal, or peritoneal cancers).  Mammogram.** / Every year beginning at age 44 years and continuing for as long as you are in good health. Consult with your health care provider.  Pap test.** / Every 3 years starting at age 58 years through age 22 or 39 years with 3 consecutive normal Pap tests. Testing can be stopped between 65 and 70 years with 3 consecutive normal Pap tests and no abnormal Pap or HPV tests in the past 10 years.  HPV screening.** / Every 3 years from ages 64 years through ages 70 or 61 years with a history of 3 consecutive normal Pap tests. Testing can be stopped between 65 and 70 years with 3 consecutive normal Pap tests and no abnormal Pap or HPV tests in the past 10 years.  Fecal occult blood test (FOBT) of stool. / Every year beginning at age 40 years and continuing until age 27 years. You may not need to do this test if you get a colonoscopy every 10 years.  Flexible sigmoidoscopy or colonoscopy.** / Every 5 years for a flexible sigmoidoscopy or every 10 years for a colonoscopy beginning at age 7 years and continuing until age 32 years.  Hepatitis C blood test.** / For all people born from [birth year] through 1966 and any individual with known risks for hepatitis C.  Osteoporosis screening.** / A one-time screening for women ages 30 years and over and women at risk for fractures or osteoporosis.  Skin self-exam. / Monthly.  Influenza vaccine. / Every year.  Tetanus, diphtheria, and acellular pertussis (Tdap/Td)  vaccine.** / 1 dose of Td every 10 years.  Varicella vaccine.** / Consult your health care provider.  Zoster vaccine.** / 1 dose for adults aged 35 years or older.  Pneumococcal 13-valent conjugate (PCV13) vaccine.** / Consult your health care provider.  Pneumococcal polysaccharide (PPSV23) vaccine.** / 1 dose for all adults aged 46 years and older.  Meningococcal vaccine.** / Consult your health care provider.  Hepatitis A vaccine.** / Consult your health care provider.  Hepatitis B vaccine.** / Consult your health care provider.  Haemophilus influenzae type b (Hib) vaccine.** / Consult your health care provider. ** Family history and personal history of risk and conditions may change your health care provider's recommendations.   This information is not intended to replace advice given to you by your health care provider. Make sure you discuss any questions you have with your health care provider.   Document Released: 01/11/2002 Document Revised: 12/06/2014 Document Reviewed: 04/12/2011 Elsevier Interactive Patient Education [employer].

## 2015-09-11 NOTE — Progress Notes (Signed)
Pre visit review using our clinic review tool, if applicable. No additional management support is needed unless otherwise documented below in the visit note. 

## 2015-10-05 ENCOUNTER — Encounter: Payer: Self-pay | Admitting: Internal Medicine

## 2015-10-21 ENCOUNTER — Encounter: Payer: Self-pay | Admitting: Internal Medicine

## 2015-10-23 ENCOUNTER — Other Ambulatory Visit: Payer: Self-pay | Admitting: Internal Medicine

## 2015-10-23 DIAGNOSIS — D510 Vitamin B12 deficiency anemia due to intrinsic factor deficiency: Secondary | ICD-10-CM

## 2015-10-23 MED ORDER — FERRALET 90 90-1 MG PO TABS: 1.0000 | ORAL_TABLET | Freq: Every day | ORAL | Status: DC

## 2015-10-25 ENCOUNTER — Other Ambulatory Visit: Payer: Self-pay

## 2015-10-25 MED ORDER — LINACLOTIDE 145 MCG PO CAPS: 145.0000 ug | ORAL_CAPSULE | Freq: Every day | ORAL | Status: DC

## 2015-12-09 ENCOUNTER — Encounter: Payer: Self-pay | Admitting: Internal Medicine

## 2015-12-15 ENCOUNTER — Ambulatory Visit (INDEPENDENT_AMBULATORY_CARE_PROVIDER_SITE_OTHER): Payer: BLUE CROSS/BLUE SHIELD | Admitting: Internal Medicine

## 2015-12-15 ENCOUNTER — Other Ambulatory Visit (INDEPENDENT_AMBULATORY_CARE_PROVIDER_SITE_OTHER): Payer: BLUE CROSS/BLUE SHIELD

## 2015-12-15 ENCOUNTER — Encounter: Payer: Self-pay | Admitting: Internal Medicine

## 2015-12-15 VITALS — BP 140/98 | HR 79 | Temp 98.6°F | Resp 16 | Ht 66.0 in | Wt 240.0 lb

## 2015-12-15 DIAGNOSIS — D509 Iron deficiency anemia, unspecified: Secondary | ICD-10-CM | POA: Diagnosis not present

## 2015-12-15 DIAGNOSIS — K5900 Constipation, unspecified: Secondary | ICD-10-CM

## 2015-12-15 DIAGNOSIS — K5909 Other constipation: Secondary | ICD-10-CM

## 2015-12-15 DIAGNOSIS — I10 Essential (primary) hypertension: Secondary | ICD-10-CM | POA: Diagnosis not present

## 2015-12-15 DIAGNOSIS — R1013 Epigastric pain: Secondary | ICD-10-CM | POA: Diagnosis not present

## 2015-12-15 DIAGNOSIS — K219 Gastro-esophageal reflux disease without esophagitis: Secondary | ICD-10-CM

## 2015-12-15 DIAGNOSIS — E876 Hypokalemia: Secondary | ICD-10-CM

## 2015-12-15 LAB — CBC WITH DIFFERENTIAL/PLATELET
Basophils Absolute: 0.1 10*3/uL (ref 0.0–0.1)
Basophils Relative: 1.3 % (ref 0.0–3.0)
Eosinophils Absolute: 0.2 10*3/uL (ref 0.0–0.7)
Eosinophils Relative: 3 % (ref 0.0–5.0)
HEMATOCRIT: 38.2 % (ref 36.0–46.0)
Hemoglobin: 12.1 g/dL (ref 12.0–15.0)
LYMPHS PCT: 37.5 % (ref 12.0–46.0)
Lymphs Abs: 3 10*3/uL (ref 0.7–4.0)
MCHC: 31.6 g/dL (ref 30.0–36.0)
MCV: 70.6 fl — AB (ref 78.0–100.0)
MONOS PCT: 8.3 % (ref 3.0–12.0)
Monocytes Absolute: 0.7 10*3/uL (ref 0.1–1.0)
Neutro Abs: 4 10*3/uL (ref 1.4–7.7)
Neutrophils Relative %: 49.9 % (ref 43.0–77.0)
Platelets: 394 10*3/uL (ref 150.0–400.0)
RBC: 5.41 Mil/uL — AB (ref 3.87–5.11)
RDW: 23.7 % — ABNORMAL HIGH (ref 11.5–15.5)
WBC: 8.1 10*3/uL (ref 4.0–10.5)

## 2015-12-15 LAB — COMPREHENSIVE METABOLIC PANEL
ALT: 34 U/L (ref 0–35)
AST: 35 U/L (ref 0–37)
Albumin: 3.9 g/dL (ref 3.5–5.2)
Alkaline Phosphatase: 95 U/L (ref 39–117)
BILIRUBIN TOTAL: 0.4 mg/dL (ref 0.2–1.2)
BUN: 7 mg/dL (ref 6–23)
CALCIUM: 9.2 mg/dL (ref 8.4–10.5)
CO2: 27 meq/L (ref 19–32)
CREATININE: 0.59 mg/dL (ref 0.40–1.20)
Chloride: 102 mEq/L (ref 96–112)
GFR: 145.04 mL/min (ref >60.00)
GLUCOSE: 101 mg/dL — AB (ref 70–99)
Potassium: 3.2 mEq/L — ABNORMAL LOW (ref 3.5–5.1)
Sodium: 140 mEq/L (ref 135–145)
Total Protein: 8 g/dL (ref 6.0–8.3)

## 2015-12-15 LAB — IBC PANEL
Iron: 59 ug/dL (ref 42–145)
SATURATION RATIOS: 11.8 % — AB (ref 20.0–50.0)
Transferrin: 357 mg/dL (ref 212.0–360.0)

## 2015-12-15 LAB — HCG, QUANTITATIVE, PREGNANCY: Quantitative HCG: 0.16 m[IU]/mL

## 2015-12-15 LAB — C-REACTIVE PROTEIN: CRP: 1.1 mg/dL (ref 0.5–20.0)

## 2015-12-15 LAB — FERRITIN: FERRITIN: 23.6 ng/mL (ref 10.0–291.0)

## 2015-12-15 LAB — SEDIMENTATION RATE: Sed Rate: 38 mm/hr — ABNORMAL HIGH (ref 0–22)

## 2015-12-15 MED ORDER — NEBIVOLOL HCL 10 MG PO TABS: 10.0000 mg | ORAL_TABLET | Freq: Every day | ORAL | Status: DC

## 2015-12-15 NOTE — Progress Notes (Signed)
Pre visit review using our clinic review tool, if applicable. No additional management support is needed unless otherwise documented below in the visit note. 

## 2015-12-15 NOTE — Patient Instructions (Signed)
Hypertension Hypertension, commonly called high blood pressure, is when the force of blood pumping through your arteries is too strong. Your arteries are the blood vessels that carry blood from your heart throughout your body. A blood pressure reading consists of a higher number over a lower number, such as 110/72. The higher number (systolic) is the pressure inside your arteries when your heart pumps. The lower number (diastolic) is the pressure inside your arteries when your heart relaxes. Ideally you want your blood pressure below 120/80. Hypertension forces your heart to work harder to pump blood. Your arteries may become narrow or stiff. Having untreated or uncontrolled hypertension can cause heart attack, stroke, kidney disease, and other problems. RISK FACTORS Some risk factors for high blood pressure are controllable. Others are not.  Risk factors you cannot control include:   Race. You may be at higher risk if you are African American.  Age. Risk increases with age.  Gender. Men are at higher risk than women before age 45 years. After age 65, women are at higher risk than men. Risk factors you can control include:  Not getting enough exercise or physical activity.  Being overweight.  Getting too much fat, sugar, calories, or salt in your diet.  Drinking too much alcohol. SIGNS AND SYMPTOMS Hypertension does not usually cause signs or symptoms. Extremely high blood pressure (hypertensive crisis) may cause headache, anxiety, shortness of breath, and nosebleed. DIAGNOSIS To check if you have hypertension, your health care provider will measure your blood pressure while you are seated, with your arm held at the level of your heart. It should be measured at least twice using the same arm. Certain conditions can cause a difference in blood pressure between your right and left arms. A blood pressure reading that is higher than normal on one occasion does not mean that you need treatment. If  it is not clear whether you have high blood pressure, you may be asked to return on a different day to have your blood pressure checked again. Or, you may be asked to monitor your blood pressure at home for 1 or more weeks. TREATMENT Treating high blood pressure includes making lifestyle changes and possibly taking medicine. Living a healthy lifestyle can help lower high blood pressure. You may need to change some of your habits. Lifestyle changes may include:  Following the DASH diet. This diet is high in fruits, vegetables, and whole grains. It is low in salt, red meat, and added sugars.  Keep your sodium intake below 2,300 mg per day.  Getting at least 30-45 minutes of aerobic exercise at least 4 times per week.  Losing weight if necessary.  Not smoking.  Limiting alcoholic beverages.  Learning ways to reduce stress. Your health care provider may prescribe medicine if lifestyle changes are not enough to get your blood pressure under control, and if one of the following is true:  You are 18-59 years of age and your systolic blood pressure is above 140.  You are 60 years of age or older, and your systolic blood pressure is above 150.  Your diastolic blood pressure is above 90.  You have diabetes, and your systolic blood pressure is over 140 or your diastolic blood pressure is over 90.  You have kidney disease and your blood pressure is above 140/90.  You have heart disease and your blood pressure is above 140/90. Your personal target blood pressure may vary depending on your medical conditions, your age, and other factors. HOME CARE INSTRUCTIONS    Have your blood pressure rechecked as directed by your health care provider.   Take medicines only as directed by your health care provider. Follow the directions carefully. Blood pressure medicines must be taken as prescribed. The medicine does not work as well when you skip doses. Skipping doses also puts you at risk for  problems.  Do not smoke.   Monitor your blood pressure at home as directed by your health care provider. SEEK MEDICAL CARE IF:   You think you are having a reaction to medicines taken.  You have recurrent headaches or feel dizzy.  You have swelling in your ankles.  You have trouble with your vision. SEEK IMMEDIATE MEDICAL CARE IF:  You develop a severe headache or confusion.  You have unusual weakness, numbness, or feel faint.  You have severe chest or abdominal pain.  You vomit repeatedly.  You have trouble breathing. MAKE SURE YOU:   Understand these instructions.  Will watch your condition.  Will get help right away if you are not doing well or get worse.   This information is not intended to replace advice given to you by your health care provider. Make sure you discuss any questions you have with your health care provider.   Document Released: 11/15/2005 Document Revised: 04/01/2015 Document Reviewed: 09/07/2013 Elsevier Interactive Patient Education 2016 Elsevier Inc[employer]  

## 2015-12-15 NOTE — Progress Notes (Signed)
Subjective:  Patient ID: Faith Sellers, female    DOB: 01/14/1976  Age: 40 y.o. MRN: [id number]  CC: Abdominal Pain; Hypertension; and Anemia   HPI Faith Sellers presents for a blood pressure check as well as concerns about abdominal pain. She has had a discomfort in her epigastric and left upper quadrant for approximately 1-2 months. She describes it as an intermittent achy sensation unrelated to eating. About 5 years ago she had a CT scan that showed an unusual form of small bowel diverticulosis. She is concerned that this may have returned. She wants to see a gastroenterologist.  Outpatient Prescriptions Prior to Visit  Medication Sig Dispense Refill  . ALPRAZolam (XANAX) 1 MG tablet TK 1 T PO QID PRN  5  . [name] (FERRALET 90) 90-1 MG TABS Take 1 tablet by mouth daily. 30 each 11  . Linaclotide (LINZESS) 145 MCG CAPS capsule Take 1 capsule (145 mcg total) by mouth daily. 90 capsule 0  . triamcinolone cream (KENALOG) 0.5 % Apply 1 application topically 3 (three) times daily. 30 g 1  . zolpidem (AMBIEN) 10 MG tablet Take 1 tablet (10 mg total) by mouth at bedtime as needed. 30 tablet 5  . ALPRAZolam (XANAX) 0.5 MG tablet      No facility-administered medications prior to visit.    ROS Review of Systems  Constitutional: Negative.  Negative for fever, chills, diaphoresis, appetite change and fatigue.  HENT: Negative.  Negative for congestion, sore throat, trouble swallowing and voice change.   Eyes: Negative.   Respiratory: Negative.  Negative for cough, choking, chest tightness, shortness of breath and stridor.   Cardiovascular: Negative.  Negative for chest pain, palpitations and leg swelling.  Gastrointestinal: Positive for abdominal pain. Negative for nausea, vomiting, diarrhea, constipation, blood in stool, abdominal distention, anal bleeding and rectal pain.  Endocrine: Negative.   Genitourinary: Negative.  Negative for dysuria, urgency, frequency, hematuria,  flank pain, decreased urine volume, difficulty urinating and genital sores.  Musculoskeletal: Negative.   Skin: Negative.   Allergic/Immunologic: Negative.   Neurological: Negative.  Negative for dizziness, weakness and numbness.  Hematological: Negative.  Negative for adenopathy. Does not bruise/bleed easily.  Psychiatric/Behavioral: Negative.     Objective:  BP 140/98 mmHg  Pulse 79  Temp(Src) 98.6 F (37 C) (Oral)  Resp 16  Ht 5\' 6"  (1.676 m)  Wt 240 lb (108.863 kg)  BMI 38.76 kg/m2  SpO2 99%  LMP 12/08/2015  BP Readings from Last 3 Encounters:  12/15/15 140/98  09/11/15 124/88  03/20/15 128/84    Wt Readings from Last 3 Encounters:  12/15/15 240 lb (108.863 kg)  09/11/15 233 lb (105.688 kg)  03/20/15 234 lb 3 oz (106.227 kg)    Physical Exam  Constitutional: She is oriented to person, place, and time. No distress.  HENT:  Head: Normocephalic and atraumatic.  Mouth/Throat: Oropharynx is clear and moist. No oropharyngeal exudate.  Eyes: Conjunctivae are normal. Right eye exhibits no discharge. Left eye exhibits no discharge. No scleral icterus.  Neck: Normal range of motion. Neck supple. No JVD present. No tracheal deviation present. No thyromegaly present.  Cardiovascular: Normal rate, regular rhythm, normal heart sounds and intact distal pulses.  Exam reveals no gallop and no friction rub.   No murmur heard. Pulmonary/Chest: Effort normal and breath sounds normal. No stridor. No respiratory distress. She has no wheezes. She has no rales. She exhibits no tenderness.  Abdominal: Soft. Normal appearance and bowel sounds are normal. She exhibits no distension,  no abdominal bruit and no mass. There is no hepatosplenomegaly, splenomegaly or hepatomegaly. There is no tenderness. There is no rebound, no guarding and no CVA tenderness.  Musculoskeletal: Normal range of motion. She exhibits no edema or tenderness.  Lymphadenopathy:    She has no cervical adenopathy.    Neurological: She is oriented to person, place, and time.  Skin: Skin is warm and dry. No rash noted. She is not diaphoretic. No erythema. No pallor.  Vitals reviewed.   Lab Results  Component Value Date   WBC 8.1 12/15/2015   HGB 12.1 12/15/2015   HCT 38.2 12/15/2015   PLT 394.0 12/15/2015   GLUCOSE 101* 12/15/2015   CHOL 207* 09/11/2015   TRIG 175.0* 09/11/2015   HDL 35.70* 09/11/2015   LDLDIRECT 151.2 07/19/2013   LDLCALC 136* 09/11/2015   ALT 34 12/15/2015   AST 35 12/15/2015   NA 140 12/15/2015   K 3.2* 12/15/2015   CL 102 12/15/2015   CREATININE 0.59 12/15/2015   BUN 7 12/15/2015   CO2 27 12/15/2015   TSH 0.95 09/11/2015   INR 0.96 08/28/2011   HGBA1C 6.0 09/11/2015    Dg Chest 2 View  11/01/2013  CLINICAL DATA:  Palpitations, left chest pain, history hypertension, hypercholesterolemia EXAM: CHEST  2 VIEW COMPARISON:  08/28/2011 FINDINGS: Upper normal heart size. Mediastinal contours and pulmonary vascularity normal. Lungs clear. No pleural effusion or pneumothorax. Bones unremarkable. IMPRESSION: No acute abnormalities. Electronically Signed   By: Ulyses Southward M.D.   On: 11/01/2013 18:41    Assessment & Plan:   Francetta was seen today for abdominal pain, hypertension and anemia.  Diagnoses and all orders for this visit:  Iron deficiency anemia- improvement noted, she continues to have menstrual cycles so will continue iron replacement therapy -     CBC with Differential/Platelet; Future -     IBC panel; Future -     Ferritin; Future -     Ambulatory referral to Gastroenterology  Essential hypertension, benign- her blood pressure is not well controlled, will start Bystolic, she has a low potassium level so will treat that as well. -     nebivolol (BYSTOLIC) 10 MG tablet; Take 1 tablet (10 mg total) by mouth daily. -     potassium chloride SA (K-DUR,KLOR-CON) 20 MEQ tablet; Take 1 tablet (20 mEq total) by mouth 2 (two) times daily.  Constipation, chronic- this has  resolved -     Ambulatory referral to Gastroenterology  Gastroesophageal reflux disease without esophagitis -     Ambulatory referral to Gastroenterology  Abdominal pain, epigastric- her exam is negative for any acute findings, her CRP is normal but her sedimentation rate is slightly elevated, I will order a CT scan to see if she has diverticulitis or some other complication from her history of diverticulosis, at her request she will also be referred to gastroenterology. -     Comprehensive metabolic panel; Future -     Sedimentation rate; Future -     C-reactive protein; Future -     hCG, quantitative, pregnancy; Future -     CT Abdomen Pelvis W Contrast; Future  Hypokalemia -     potassium chloride SA (K-DUR,KLOR-CON) 20 MEQ tablet; Take 1 tablet (20 mEq total) by mouth 2 (two) times daily.   I am having Ms. Amirault start on nebivolol and potassium chloride SA. I am also having her maintain her triamcinolone cream, ALPRAZolam, zolpidem, FERRALET 90, and Linaclotide.  Meds ordered this encounter  Medications  .  nebivolol (BYSTOLIC) 10 MG tablet    Sig: Take 1 tablet (10 mg total) by mouth daily.    Dispense:  42 tablet    Refill:  0  . potassium chloride SA (K-DUR,KLOR-CON) 20 MEQ tablet    Sig: Take 1 tablet (20 mEq total) by mouth 2 (two) times daily.    Dispense:  60 tablet    Refill:  5     Follow-up: Return in about 6 weeks (around 01/26/2016).  Sanda Linger, MD

## 2015-12-16 ENCOUNTER — Encounter: Payer: Self-pay | Admitting: Internal Medicine

## 2015-12-16 ENCOUNTER — Other Ambulatory Visit: Payer: Self-pay | Admitting: Internal Medicine

## 2015-12-16 DIAGNOSIS — R1013 Epigastric pain: Secondary | ICD-10-CM | POA: Insufficient documentation

## 2015-12-16 DIAGNOSIS — E876 Hypokalemia: Secondary | ICD-10-CM | POA: Insufficient documentation

## 2015-12-16 DIAGNOSIS — K5732 Diverticulitis of large intestine without perforation or abscess without bleeding: Secondary | ICD-10-CM | POA: Insufficient documentation

## 2015-12-16 MED ORDER — POTASSIUM CHLORIDE CRYS ER 20 MEQ PO TBCR: 20.0000 meq | EXTENDED_RELEASE_TABLET | Freq: Two times a day (BID) | ORAL | Status: DC

## 2015-12-16 MED ORDER — CIPROFLOXACIN HCL 500 MG PO TABS: 500.0000 mg | ORAL_TABLET | Freq: Two times a day (BID) | ORAL | Status: DC

## 2015-12-16 MED ORDER — CIPROFLOXACIN HCL 500 MG PO TABS: 500.0000 mg | ORAL_TABLET | Freq: Two times a day (BID) | ORAL | Status: AC

## 2015-12-16 MED ORDER — METRONIDAZOLE 500 MG PO TABS: 500.0000 mg | ORAL_TABLET | Freq: Three times a day (TID) | ORAL | Status: AC

## 2015-12-25 ENCOUNTER — Encounter: Payer: Self-pay | Admitting: Internal Medicine

## 2015-12-25 MED ORDER — FLUCONAZOLE 150 MG PO TABS: ORAL_TABLET | ORAL | Status: DC

## 2015-12-25 NOTE — Telephone Encounter (Signed)
Done erx 

## 2016-02-10 ENCOUNTER — Encounter: Payer: Self-pay | Admitting: Women's Health

## 2016-02-10 ENCOUNTER — Ambulatory Visit (INDEPENDENT_AMBULATORY_CARE_PROVIDER_SITE_OTHER): Payer: BLUE CROSS/BLUE SHIELD | Admitting: Women's Health

## 2016-02-10 VITALS — BP 134/90 | Ht 66.0 in | Wt 245.0 lb

## 2016-02-10 DIAGNOSIS — Z01419 Encounter for gynecological examination (general) (routine) without abnormal findings: Secondary | ICD-10-CM | POA: Diagnosis not present

## 2016-02-10 NOTE — Patient Instructions (Signed)
Health Maintenance, Female Adopting a healthy lifestyle and getting preventive care can go a long way to promote health and wellness. Talk with your health care provider about what schedule of regular examinations is right for you. This is a good chance for you to check in with your provider about disease prevention and staying healthy. In between checkups, there are plenty of things you can do on your own. Experts have done a lot of research about which lifestyle changes and preventive measures are most likely to keep you healthy. Ask your health care provider for more information. WEIGHT AND DIET  Eat a healthy diet  Be sure to include plenty of vegetables, fruits, low-fat dairy products, and lean protein.  Do not eat a lot of foods high in solid fats, added sugars, or salt.  Get regular exercise. This is one of the most important things you can do for your health.  Most adults should exercise for at least 150 minutes each week. The exercise should increase your heart rate and make you sweat (moderate-intensity exercise).  Most adults should also do strengthening exercises at least twice a week. This is in addition to the moderate-intensity exercise.  Maintain a healthy weight  Body mass index (BMI) is a measurement that can be used to identify possible weight problems. It estimates body fat based on height and weight. Your health care provider can help determine your BMI and help you achieve or maintain a healthy weight.  For females 20 years of age and older:   A BMI below 18.5 is considered underweight.  A BMI of 18.5 to 24.9 is normal.  A BMI of 25 to 29.9 is considered overweight.  A BMI of 30 and above is considered obese.  Watch levels of cholesterol and blood lipids  You should start having your blood tested for lipids and cholesterol at 41 years of age, then have this test every 5 years.  You may need to have your cholesterol levels checked more often if:  Your lipid  or cholesterol levels are high.  You are older than 41 years of age.  You are at high risk for heart disease.  CANCER SCREENING   Lung Cancer  Lung cancer screening is recommended for adults 55-80 years old who are at high risk for lung cancer because of a history of smoking.  A yearly low-dose CT scan of the lungs is recommended for people who:  Currently smoke.  Have quit within the past 15 years.  Have at least a 30-pack-year history of smoking. A pack year is smoking an average of one pack of cigarettes a day for 1 year.  Yearly screening should continue until it has been 15 years since you quit.  Yearly screening should stop if you develop a health problem that would prevent you from having lung cancer treatment.  Breast Cancer  Practice breast self-awareness. This means understanding how your breasts normally appear and feel.  It also means doing regular breast self-exams. Let your health care provider know about any changes, no matter how small.  If you are in your 20s or 30s, you should have a clinical breast exam (CBE) by a health care provider every 1-3 years as part of a regular health exam.  If you are 40 or older, have a CBE every year. Also consider having a breast X-ray (mammogram) every year.  If you have a family history of breast cancer, talk to your health care provider about genetic screening.  If you   are at high risk for breast cancer, talk to your health care provider about having an MRI and a mammogram every year.  Breast cancer gene (BRCA) assessment is recommended for women who have family members with BRCA-related cancers. BRCA-related cancers include:  Breast.  Ovarian.  Tubal.  Peritoneal cancers.  Results of the assessment will determine the need for genetic counseling and BRCA1 and BRCA2 testing. Cervical Cancer Your health care provider may recommend that you be screened regularly for cancer of the pelvic organs (ovaries, uterus, and  vagina). This screening involves a pelvic examination, including checking for microscopic changes to the surface of your cervix (Pap test). You may be encouraged to have this screening done every 3 years, beginning at age 21.  For women ages 30-65, health care providers may recommend pelvic exams and Pap testing every 3 years, or they may recommend the Pap and pelvic exam, combined with testing for human papilloma virus (HPV), every 5 years. Some types of HPV increase your risk of cervical cancer. Testing for HPV may also be done on women of any age with unclear Pap test results.  Other health care providers may not recommend any screening for nonpregnant women who are considered low risk for pelvic cancer and who do not have symptoms. Ask your health care provider if a screening pelvic exam is right for you.  If you have had past treatment for cervical cancer or a condition that could lead to cancer, you need Pap tests and screening for cancer for at least 20 years after your treatment. If Pap tests have been discontinued, your risk factors (such as having a new sexual partner) need to be reassessed to determine if screening should resume. Some women have medical problems that increase the chance of getting cervical cancer. In these cases, your health care provider may recommend more frequent screening and Pap tests. Colorectal Cancer  This type of cancer can be detected and often prevented.  Routine colorectal cancer screening usually begins at 41 years of age and continues through 41 years of age.  Your health care provider may recommend screening at an earlier age if you have risk factors for colon cancer.  Your health care provider may also recommend using home test kits to check for hidden blood in the stool.  A small camera at the end of a tube can be used to examine your colon directly (sigmoidoscopy or colonoscopy). This is done to check for the earliest forms of colorectal  cancer.  Routine screening usually begins at age 50.  Direct examination of the colon should be repeated every 5-10 years through 41 years of age. However, you may need to be screened more often if early forms of precancerous polyps or small growths are found. Skin Cancer  Check your skin from head to toe regularly.  Tell your health care provider about any new moles or changes in moles, especially if there is a change in a mole's shape or color.  Also tell your health care provider if you have a mole that is larger than the size of a pencil eraser.  Always use sunscreen. Apply sunscreen liberally and repeatedly throughout the day.  Protect yourself by wearing long sleeves, pants, a wide-brimmed hat, and sunglasses whenever you are outside. HEART DISEASE, DIABETES, AND HIGH BLOOD PRESSURE   High blood pressure causes heart disease and increases the risk of stroke. High blood pressure is more likely to develop in:  People who have blood pressure in the high end   of the normal range (130-139/85-89 mm Hg).  People who are overweight or obese.  People who are African American.  If you are 38-23 years of age, have your blood pressure checked every 3-5 years. If you are 61 years of age or older, have your blood pressure checked every year. You should have your blood pressure measured twice--once when you are at a hospital or clinic, and once when you are not at a hospital or clinic. Record the average of the two measurements. To check your blood pressure when you are not at a hospital or clinic, you can use:  An automated blood pressure machine at a pharmacy.  A home blood pressure monitor.  If you are between 45 years and 39 years old, ask your health care provider if you should take aspirin to prevent strokes.  Have regular diabetes screenings. This involves taking a blood sample to check your fasting blood sugar level.  If you are at a normal weight and have a low risk for diabetes,  have this test once every three years after 41 years of age.  If you are overweight and have a high risk for diabetes, consider being tested at a younger age or more often. PREVENTING INFECTION  Hepatitis B  If you have a higher risk for hepatitis B, you should be screened for this virus. You are considered at high risk for hepatitis B if:  You were born in a country where hepatitis B is common. Ask your health care provider which countries are considered high risk.  Your parents were born in a high-risk country, and you have not been immunized against hepatitis B (hepatitis B vaccine).  You have HIV or AIDS.  You use needles to inject street drugs.  You live with someone who has hepatitis B.  You have had sex with someone who has hepatitis B.  You get hemodialysis treatment.  You take certain medicines for conditions, including cancer, organ transplantation, and autoimmune conditions. Hepatitis C  Blood testing is recommended for:  Everyone born from [birth year] through 1966.  Anyone with known risk factors for hepatitis C. Sexually transmitted infections (STIs)  You should be screened for sexually transmitted infections (STIs) including gonorrhea and chlamydia if:  You are sexually active and are younger than 41 years of age.  You are older than 41 years of age and your health care provider tells you that you are at risk for this type of infection.  Your sexual activity has changed since you were last screened and you are at an increased risk for chlamydia or gonorrhea. Ask your health care provider if you are at risk.  If you do not have HIV, but are at risk, it may be recommended that you take a prescription medicine daily to prevent HIV infection. This is called pre-exposure prophylaxis (PrEP). You are considered at risk if:  You are sexually active and do not regularly use condoms or know the HIV status of your partner(s).  You take drugs by injection.  You are sexually  active with a partner who has HIV. Talk with your health care provider about whether you are at high risk of being infected with HIV. If you choose to begin PrEP, you should first be tested for HIV. You should then be tested every 3 months for as long as you are taking PrEP.  PREGNANCY   If you are premenopausal and you may become pregnant, ask your health care provider about preconception counseling.  If you may  become pregnant, take 400 to 800 micrograms (mcg) of folic acid every day.  If you want to prevent pregnancy, talk to your health care provider about birth control (contraception). OSTEOPOROSIS AND MENOPAUSE   Osteoporosis is a disease in which the bones lose minerals and strength with aging. This can result in serious bone fractures. Your risk for osteoporosis can be identified using a bone density scan.  If you are 42 years of age or older, or if you are at risk for osteoporosis and fractures, ask your health care provider if you should be screened.  Ask your health care provider whether you should take a calcium or vitamin D supplement to lower your risk for osteoporosis.  Menopause may have certain physical symptoms and risks.  Hormone replacement therapy may reduce some of these symptoms and risks. Talk to your health care provider about whether hormone replacement therapy is right for you.  HOME CARE INSTRUCTIONS   Schedule regular health, dental, and eye exams.  Stay current with your immunizations.   Do not use any tobacco products including cigarettes, chewing tobacco, or electronic cigarettes.  If you are pregnant, do not drink alcohol.  If you are breastfeeding, limit how much and how often you drink alcohol.  Limit alcohol intake to no more than 1 drink per day for nonpregnant women. One drink equals 12 ounces of beer, 5 ounces of wine, or 1 ounces of hard liquor.  Do not use street drugs.  Do not share needles.  Ask your health care provider for help if  you need support or information about quitting drugs.  Tell your health care provider if you often feel depressed.  Tell your health care provider if you have ever been abused or do not feel safe at home.   This information is not intended to replace advice given to you by your health care provider. Make sure you discuss any questions you have with your health care provider.   Document Released: 05/31/2011 Document Revised: 12/06/2014 Document Reviewed: 10/17/2013 Elsevier Interactive Patient Education 2016 [place] Carbohydrate Counting for Diabetes Mellitus Carbohydrate counting is a method for keeping track of the amount of carbohydrates you eat. Eating carbohydrates naturally increases the level of sugar (glucose) in your blood, so it is important for you to know the amount that is okay for you to have in every meal. Carbohydrate counting helps keep the level of glucose in your blood within normal limits. The amount of carbohydrates allowed is different for every person. A dietitian can help you calculate the amount that is right for you. Once you know the amount of carbohydrates you can have, you can count the carbohydrates in the foods you want to eat. Carbohydrates are found in the following foods:  Grains, such as breads and cereals.  Dried beans and soy products.  Starchy vegetables, such as potatoes, peas, and corn.  Fruit and fruit juices.  Milk and yogurt.  Sweets and snack foods, such as cake, cookies, candy, chips, soft drinks, and fruit drinks. CARBOHYDRATE COUNTING There are two ways to count the carbohydrates in your food. You can use either of the methods or a combination of both. Reading the "Nutrition Facts" on [place] The "Nutrition Facts" is an area that is included on the labels of almost all packaged food and beverages in the [place]. It includes the serving size of that food or beverage and information about the nutrients in each serving of  the food, including the grams (g)  of carbohydrate per serving.  Decide the number of servings of this food or beverage that you will be able to eat or drink. Multiply that number of servings by the number of grams of carbohydrate that is listed on the label for that serving. The total will be the amount of carbohydrates you will be having when you eat or drink this food or beverage. Learning Standard Serving Sizes of Food When you eat food that is not packaged or does not include "Nutrition Facts" on the label, you need to measure the servings in order to count the amount of carbohydrates.A serving of most carbohydrate-rich foods contains about 15 g of carbohydrates. The following list includes serving sizes of carbohydrate-rich foods that provide 15 g ofcarbohydrate per serving:   1 slice of bread (1 oz) or 1 six-inch tortilla.    of a hamburger bun or English muffin.  4-6 crackers.   cup unsweetened dry cereal.    cup hot cereal.   cup rice or pasta.    cup mashed potatoes or  of a large baked potato.  1 cup fresh fruit or one small piece of fruit.    cup canned or frozen fruit or fruit juice.  1 cup milk.   cup plain fat-free yogurt or yogurt sweetened with artificial sweeteners.   cup cooked dried beans or starchy vegetable, such as peas, corn, or potatoes.  Decide the number of standard-size servings that you will eat. Multiply that number of servings by 15 (the grams of carbohydrates in that serving). For example, if you eat 2 cups of strawberries, you will have eaten 2 servings and 30 g of carbohydrates (2 servings x 15 g = 30 g). For foods such as soups and casseroles, in which more than one food is mixed in, you will need to count the carbohydrates in each food that is included. EXAMPLE OF CARBOHYDRATE COUNTING Sample Dinner  3 oz chicken breast.   cup of brown rice.   cup of corn.  1 cup milk.   1 cup strawberries with sugar-free whipped topping.   Carbohydrate Calculation Step 1: Identify the foods that contain carbohydrates:   Rice.   Corn.   Milk.   Strawberries. Step 2:Calculate the number of servings eaten of each:   2 servings of rice.   1 serving of corn.   1 serving of milk.   1 serving of strawberries. Step 3: Multiply each of those number of servings by 15 g:   2 servings of rice x 15 g = 30 g.   1 serving of corn x 15 g = 15 g.   1 serving of milk x 15 g = 15 g.   1 serving of strawberries x 15 g = 15 g. Step 4: Add together all of the amounts to find the total grams of carbohydrates eaten: 30 g + 15 g + 15 g + 15 g = 75 g.   This information is not intended to replace advice given to you by your health care provider. Make sure you discuss any questions you have with your health care provider.   Document Released: 11/15/2005 Document Revised: 12/06/2014 Document Reviewed: 10/12/2013 Elsevier Interactive Patient Education 2016 Else[employer] Inc[employer] Sleeve Gastrectomy A sleeve gastrectomy is a surgery in which a large portion of the stomach is removed. After the surgery, the stomach will be a narrow tube about the size of a banana. This surgery is performed to help a person lose weight. The person loses weight   because the reduced size of the stomach restricts the amount of food that the person can eat. The stomach will hold much less food than before the surgery. Also, the part of the stomach that is removed produces a hormone that causes hunger.  This surgery is done for people who have morbid obesity, defined as a body mass index (BMI) greater than 40. BMI is an estimate of body fat and is calculated from the height and weight of a person. This surgery may also be done for people with a BMI between 35 and 40 if they have other diseases, such as type 2 diabetes mellitus, obstructive sleep apnea, or heart and lung disorders (cardiopulmonary diseases).  LET YOUR HEALTH CARE PROVIDER KNOW ABOUT:  Any  allergies you have.   All medicines you are taking, including vitamins, herbs, eyedrops, creams, and over-the-counter medicines.   Use of steroids (by mouth or creams).   Previous problems you or members of your family have had with the use of anesthetics.   Any blood disorders you have.   Previous surgeries you have had.   Possibility of pregnancy, if this applies.   Other health problems you have. RISKS AND COMPLICATIONS Generally, sleeve gastrectomy is a safe procedure. However, as with any procedure, complications can occur. Possible complications include:  Infection.  Bleeding.  Blood clots.  Damage to other organs or tissue.  Leakage of fluid from the stomach into the abdominal cavity (rare). BEFORE THE PROCEDURE  You may need to have blood tests and imaging tests (such as X-rays or ultrasonography) done before the day of surgery. A test to evaluate your esophagus and how it moves (esophageal manometry) may also be done.  You may be placed on a liquid diet 2-3 weeks before the surgery.  Ask your health care provider about changing or stopping your regular medicines.  Do not eat or drink anything for at least 8 hours before the procedure.   Make plans to have someone drive you home after your hospital stay. Also arrange for someone to help you with activities during recovery. PROCEDURE  A laparoscopic technique is usually used for this surgery:  You will be given medicine to make you sleep through the procedure (general anesthetic). This medicine will be given through an intravenous (IV) access tube that is put into one of your veins.  Once you are asleep, your abdomen will be cleaned and sterilized.  Several small incisions will be made in your abdomen.  Your abdomen will be filled with air so that it expands. This gives the surgeon more room to operate and makes your organs easier to see.  A thin, lighted tube with a tiny camera on the end (laparoscope)  is put through a small incision in your abdomen. The camera on the laparoscope sends a picture to a TV screen in the operating room. This gives the surgeon a good view inside the abdomen.  Hollow tubes are put through the other small incisions in your abdomen. The tools needed for the procedure are put through these tubes.  The surgeon uses staples to divide part of the stomach and then removes it through one of the incisions.  The remaining stomach may be reinforced using stitches or surgical glue or both to prevent leakage of the stomach contents. A small tube (drain) may be placed through one of the incisions to allow extra fluid to flow from the area.  The incisions are closed with stitches, staples, or glue. AFTER THE PROCEDURE    You will be monitored closely in a recovery area. Once the anesthetic has worn off, you will likely be moved to a regular hospital room.  You will be given medicine for pain and nausea.   You may have a drain from one of the incisions in your abdomen. If a drain is used, it may stay in place after you go home from the hospital and be removed at a follow-up appointment.   You will be encouraged to walk around several times a day. This helps prevent blood clots.  You will be started on a liquid diet the first day after your surgery. Sometimes a test is done to check for leaking before you can eat.  You will be urged to cough and do deep breathing exercises. This helps prevent a lung infection after a surgery.  You will likely need to stay in the hospital for a few days.    This information is not intended to replace advice given to you by your health care provider. Make sure you discuss any questions you have with your health care provider.   Document Released: 09/12/2009 Document Revised: 07/18/2013 Document Reviewed: 05/09/2015 Elsevier Interactive Patient Education 2016 Elsevier Inc[employer]  

## 2016-02-10 NOTE — Addendum Note (Signed)
Addended by: Etta GrandchildJONES, THOMAS L on: 02/10/2016 05:32 PM   Modules accepted: Kipp BroodSmartSet

## 2016-02-10 NOTE — Progress Notes (Signed)
Hilda BladesStacy Heckart 04/29/1976 [id number]    History:    Presents for annual exam.  Monthly 6 day cycle/ condoms/ same partner. Normal Pap history. Hypertension and hypercholesterolemia managed by primary care. Negative colonoscopy 2012, IBS/constipation. Adopted minimal parent  history known, sister hypertension.  Past medical history, past surgical history, family history and social history were all reviewed and documented in the EPIC chart. Works at [employer] most days from home. 2 sons both doing well.  ROS:  A ROS was performed and pertinent positives and negatives are included.  Exam:  Filed Vitals:   02/10/16 1138  BP: 134/90    General appearance:  Normal Thyroid:  Symmetrical, normal in size, without palpable masses or nodularity. Respiratory  Auscultation:  Clear without wheezing or rhonchi Cardiovascular  Auscultation:  Regular rate, without rubs, murmurs or gallops  Edema/varicosities:  Not grossly evident Abdominal  Soft,nontender, without masses, guarding or rebound.  Liver/spleen:  No organomegaly noted  Hernia:  None appreciated  Skin  Inspection:  Grossly normal   Breasts: Examined lying and sitting.     Right: Without masses, retractions, discharge or axillary adenopathy.     Left: Without masses, retractions, discharge or axillary adenopathy. Gentitourinary   Inguinal/mons:  Normal without inguinal adenopathy  External genitalia:  Normal  BUS/Urethra/Skene's glands:  Normal  Vagina:  Normal  Cervix:  Normal  Uterus:   normal in size, shape and contour.  Midline and mobile  Adnexa/parametria:     Rt: Without masses or tenderness.   Lt: Without masses or tenderness.  Anus and perineum: Normal  Digital rectal exam: Normal sphincter tone without palpated masses or tenderness  Assessment/Plan:  40 y.o. SBF G4 P2 for annual exam with no complaints.  Monthly cycle/condoms Hypertension/hypercholesterolemia-primary care manages labs and  meds Obesity  Plan: Contraception reviewed will continue condoms. SBE's, annual screening mammogram, breast center information reviewed instructed to schedule. Encouraged increase regular exercise decreased calories, low carb diet for weight loss. UA, Pap with HR HPV typing, new screening guidelines reviewed.    Harrington ChallengerYOUNG,Dorothye Berni J [facility] CenterWHNP, 5:13 PM 02/10/2016

## 2016-02-12 LAB — PAP IG AND HPV HIGH-RISK: HPV DNA HIGH RISK: NOT DETECTED

## 2016-03-01 ENCOUNTER — Encounter: Payer: Self-pay | Admitting: Internal Medicine

## 2016-03-01 ENCOUNTER — Other Ambulatory Visit: Payer: Self-pay | Admitting: Internal Medicine

## 2016-03-01 DIAGNOSIS — I10 Essential (primary) hypertension: Secondary | ICD-10-CM

## 2016-03-01 MED ORDER — HYDROCHLOROTHIAZIDE 12.5 MG PO CAPS: 12.5000 mg | ORAL_CAPSULE | Freq: Every day | ORAL | Status: DC

## 2016-03-10 ENCOUNTER — Other Ambulatory Visit: Payer: Self-pay | Admitting: Women's Health

## 2016-03-10 ENCOUNTER — Encounter: Payer: Self-pay | Admitting: Women's Health

## 2016-03-10 MED ORDER — IBUPROFEN 600 MG PO TABS: 600.0000 mg | ORAL_TABLET | Freq: Three times a day (TID) | ORAL | Status: DC | PRN

## 2016-03-15 ENCOUNTER — Encounter: Payer: Self-pay | Admitting: Women's Health

## 2016-03-16 NOTE — Telephone Encounter (Signed)
TC states has some light bleeding prior to cycle starting and some after cycle ending bleeding is usually completed by day 7 or 8 no bleeding in between cycles, reviewed this can be normal. Same partner with negative STD screen. Is in the process of losing weight has already lost 7 pounds, instructed to call if bleeding persists greater than 7-8 days or any midcycle spotting we will proceed with a sonohysterogram with Dr. Audie BoxFontaine.

## 2016-03-25 ENCOUNTER — Other Ambulatory Visit: Payer: Self-pay | Admitting: Women's Health

## 2016-03-25 DIAGNOSIS — N920 Excessive and frequent menstruation with regular cycle: Secondary | ICD-10-CM

## 2016-03-25 NOTE — Telephone Encounter (Signed)
Phone call, states having cycles lasting 7-8 days, has occasional brown spotting midcycle, no red bleeding. Ultrasound next week would be the week prior to her cycle, reviewed best to do the week after cycle for an ultrasound. Will cancel and reschedule. Again offered sonohysterogram would rather proceed with just aultrasound and if needed proceed to the sonohysterogram.

## 2016-03-25 NOTE — Telephone Encounter (Signed)
-----   Message from Hulen Lusteronna B Mobley sent at 03/25/2016  9:44 AM EDT ----- Regarding: u/s? Contact: [phone] Faith Sellers  Pt c/b stating you had called her and said to schedule regular u/s? (TC states shgm w/TF)    She is scheduled for gyn u/s and ov w/you on Monday may 1 will need order pls if this is ok?  Thanks :)

## 2016-03-29 ENCOUNTER — Ambulatory Visit: Payer: BLUE CROSS/BLUE SHIELD | Admitting: Women's Health

## 2016-03-29 ENCOUNTER — Other Ambulatory Visit: Payer: BLUE CROSS/BLUE SHIELD

## 2016-04-19 ENCOUNTER — Encounter: Payer: Self-pay | Admitting: Internal Medicine

## 2016-04-22 ENCOUNTER — Encounter: Payer: Self-pay | Admitting: Internal Medicine

## 2016-04-22 ENCOUNTER — Other Ambulatory Visit: Payer: Self-pay | Admitting: Internal Medicine

## 2016-04-22 DIAGNOSIS — G4700 Insomnia, unspecified: Secondary | ICD-10-CM

## 2016-04-22 MED ORDER — ALPRAZOLAM 1 MG PO TABS: ORAL_TABLET | ORAL | Status: DC

## 2016-04-22 MED ORDER — ZOLPIDEM TARTRATE 10 MG PO TABS: 10.0000 mg | ORAL_TABLET | Freq: Every evening | ORAL | Status: DC | PRN

## 2016-04-23 NOTE — Telephone Encounter (Signed)
Called in rx for zolpidem and alprazolam? Pharmacist asked about the #30 with a sig of qid. Is #30 for a 30 day supply or is it to be used prn with not taking any more that 4 in a day?   Please advise.

## 2016-04-25 ENCOUNTER — Other Ambulatory Visit: Payer: Self-pay | Admitting: Internal Medicine

## 2016-04-27 NOTE — Telephone Encounter (Signed)
Patient was able to pick up alprazolam on Friday. Pharmacy wanted to verify if the quantity of 30 was for 30 days?

## 2016-05-10 ENCOUNTER — Other Ambulatory Visit: Payer: Self-pay | Admitting: Internal Medicine

## 2016-05-10 ENCOUNTER — Encounter: Payer: Self-pay | Admitting: Internal Medicine

## 2016-06-15 ENCOUNTER — Other Ambulatory Visit: Payer: Self-pay | Admitting: Internal Medicine

## 2016-06-15 NOTE — Telephone Encounter (Signed)
rx faxed to [facility])

## 2016-07-27 ENCOUNTER — Other Ambulatory Visit: Payer: Self-pay | Admitting: Internal Medicine

## 2016-07-27 DIAGNOSIS — G4700 Insomnia, unspecified: Secondary | ICD-10-CM

## 2016-07-28 NOTE — Telephone Encounter (Signed)
Faxed script back to [facility].../lmb 

## 2016-09-05 ENCOUNTER — Emergency Department (HOSPITAL_BASED_OUTPATIENT_CLINIC_OR_DEPARTMENT_OTHER): Payer: BLUE CROSS/BLUE SHIELD

## 2016-09-05 ENCOUNTER — Emergency Department (HOSPITAL_BASED_OUTPATIENT_CLINIC_OR_DEPARTMENT_OTHER)
Admission: EM | Admit: 2016-09-05 | Discharge: 2016-09-05 | Disposition: A | Discharge status: Home / Self Care | Payer: BLUE CROSS/BLUE SHIELD | Attending: Emergency Medicine | Admitting: Emergency Medicine

## 2016-09-05 ENCOUNTER — Encounter (HOSPITAL_BASED_OUTPATIENT_CLINIC_OR_DEPARTMENT_OTHER): Payer: Self-pay | Admitting: Emergency Medicine

## 2016-09-05 DIAGNOSIS — R1032 Left lower quadrant pain: Secondary | ICD-10-CM

## 2016-09-05 DIAGNOSIS — D509 Iron deficiency anemia, unspecified: Secondary | ICD-10-CM | POA: Diagnosis not present

## 2016-09-05 DIAGNOSIS — Z79899 Other long term (current) drug therapy: Secondary | ICD-10-CM | POA: Diagnosis not present

## 2016-09-05 DIAGNOSIS — I10 Essential (primary) hypertension: Secondary | ICD-10-CM | POA: Diagnosis not present

## 2016-09-05 LAB — COMPREHENSIVE METABOLIC PANEL
ALBUMIN: 3.7 g/dL (ref 3.5–5.0)
ALK PHOS: 71 U/L (ref 38–126)
ALT: 36 U/L (ref 14–54)
ANION GAP: 6 (ref 5–15)
AST: 36 U/L (ref 15–41)
BILIRUBIN TOTAL: 0.3 mg/dL (ref 0.3–1.2)
BUN: 14 mg/dL (ref 6–20)
CALCIUM: 9 mg/dL (ref 8.9–10.3)
CO2: 26 mmol/L (ref 22–32)
CREATININE: 0.74 mg/dL (ref 0.44–1.00)
Chloride: 105 mmol/L (ref 101–111)
GFR calc non Af Amer: >60 mL/min (ref >60)
GLUCOSE: 111 mg/dL — AB (ref 65–99)
Potassium: 3.3 mmol/L — ABNORMAL LOW (ref 3.5–5.1)
Sodium: 137 mmol/L (ref 135–145)
TOTAL PROTEIN: 8 g/dL (ref 6.5–8.1)

## 2016-09-05 LAB — URINE MICROSCOPIC-ADD ON

## 2016-09-05 LAB — CBC WITH DIFFERENTIAL/PLATELET
BASOS PCT: 0 %
Basophils Absolute: 0 10*3/uL (ref 0.0–0.1)
EOS ABS: 0.2 10*3/uL (ref 0.0–0.7)
EOS PCT: 3 %
HCT: 29.7 % — ABNORMAL LOW (ref 36.0–46.0)
Hemoglobin: 9.1 g/dL — ABNORMAL LOW (ref 12.0–15.0)
Lymphocytes Relative: 35 %
Lymphs Abs: 3.1 10*3/uL (ref 0.7–4.0)
MCH: 20.4 pg — AB (ref 26.0–34.0)
MCHC: 30.6 g/dL (ref 30.0–36.0)
MCV: 66.6 fL — ABNORMAL LOW (ref 78.0–100.0)
MONOS PCT: 8 %
Monocytes Absolute: 0.7 10*3/uL (ref 0.1–1.0)
NEUTROS PCT: 54 %
Neutro Abs: 4.7 10*3/uL (ref 1.7–7.7)
PLATELETS: 337 10*3/uL (ref 150–400)
RBC: 4.46 MIL/uL (ref 3.87–5.11)
RDW: 17.1 % — AB (ref 11.5–15.5)
WBC: 8.8 10*3/uL (ref 4.0–10.5)

## 2016-09-05 LAB — URINALYSIS, ROUTINE W REFLEX MICROSCOPIC
Bilirubin Urine: NEGATIVE
Glucose, UA: NEGATIVE mg/dL
Ketones, ur: NEGATIVE mg/dL
Nitrite: NEGATIVE
PROTEIN: 100 mg/dL — AB
Specific Gravity, Urine: 1.027 (ref 1.005–1.030)
pH: 6 (ref 5.0–8.0)

## 2016-09-05 LAB — PREGNANCY, URINE: PREG TEST UR: NEGATIVE

## 2016-09-05 LAB — LIPASE, BLOOD: Lipase: 34 U/L (ref 11–51)

## 2016-09-05 MED ORDER — POTASSIUM CHLORIDE CRYS ER 20 MEQ PO TBCR: 40.0000 meq | EXTENDED_RELEASE_TABLET | Freq: Once | ORAL | Status: DC

## 2016-09-05 MED ORDER — IOPAMIDOL (ISOVUE-300) INJECTION 61%
100.0000 mL | Freq: Once | INTRAVENOUS | Status: AC | PRN
Start: 2016-09-05 — End: 2016-09-05
  Administered 2016-09-05: 100 mL via INTRAVENOUS

## 2016-09-05 MED ORDER — POTASSIUM CHLORIDE CRYS ER 20 MEQ PO TBCR
40.0000 meq | EXTENDED_RELEASE_TABLET | Freq: Once | ORAL | Status: AC
  Administered 2016-09-05: 40 meq via ORAL
  Filled 2016-09-05: qty 2

## 2016-09-05 MED ORDER — SODIUM CHLORIDE 0.9 % IV BOLUS (SEPSIS)
1000.0000 mL | Freq: Once | INTRAVENOUS | Status: AC
  Administered 2016-09-05: 1000 mL via INTRAVENOUS

## 2016-09-05 NOTE — ED Notes (Signed)
Pt returned from CT °

## 2016-09-05 NOTE — ED Provider Notes (Signed)
[facility] Provider Note   CSN: [id number] Arrival date & time: 09/05/16  1503  By signing my name below, I, Faith Sellers, attest that this documentation has been prepared under the direction and in the presence of non-physician practitioner, Carlene CoriaSerena Y Kursten Kruk, PA-C. Electronically Signed: Majel HomerPeyton Sellers, Scribe. 09/05/2016. 4:30 PM.  History   Chief Complaint Chief Complaint  Patient presents with  . Abdominal Pain   The history is provided by the patient. No language interpreter was used.   HPI Comments: Faith Sellers is a 40 y.o. female with PMHx of diverticulitis, gastric ulcer, HTN and HLD, who presents to the Emergency Department complaining of gradually worsening, LLQ abdominal pain that began a few days ago and worsened today. Pt reports associated abdominal swelling and states her pain sometimes radiates into her back. She notes her pain was mildly relieved after a BM yesterday but was exacerbated this morning after eating. She states her last BM was yesterday and was normal; however she has not had a BM today and usually has 2 a day. She report hx of diverticulitis and states her symptoms feel similar. Pt states she took ibuprofen with no relief. She denies diarrhea, nausea, vomiting, fever, chills, dysuria, urinary frequency and PSHx to her abdomen.   Past Medical History:  Diagnosis Date  . Anxiety   . Gastric ulcer   . GERD (gastroesophageal reflux disease)   . Hypercholesteremia   . Hypertension     Patient Active Problem List   Diagnosis Date Noted  . Abdominal pain, epigastric 12/16/2015  . Hypokalemia 12/16/2015  . Diverticulitis of colon 12/16/2015  . Iron deficiency anemia 09/11/2015  . Routine general medical examination at a health care facility 07/19/2013  . Obesity, Class II, BMI 35-39.9, with comorbidity 05/24/2013  . Constipation, chronic 05/24/2013  . Depression with anxiety 12/01/2012  . Dyshidrotic eczema 12/01/2012  . Other abnormal glucose  03/03/2012  . Pure hypercholesterolemia 03/03/2012  . GERD (gastroesophageal reflux disease) 11/15/2011  . Essential hypertension, benign 11/15/2011  . Insomnia 11/15/2011    History reviewed. No pertinent surgical history.  OB History    Gravida Para Term Preterm AB Living   4 2 2   2 2    SAB TAB Ectopic Multiple Live Births           2     Home Medications    Prior to Admission medications   Medication Sig Start Date End Date Taking? Authorizing Provider  ALPRAZolam Prudy Feeler(XANAX) 1 MG tablet TAKE 1 TABLET BY MOUTH FOUR TIMES DAILY AS NEEDED 06/15/16   [name], MD  cholecalciferol (VITAMIN D) 1000 units tablet Take 1,000 Units by mouth daily.    Historical Provider, MD  [name] (FERRALET 90) 90-1 MG TABS Take 1 tablet by mouth daily. 10/23/15   [name], MD  hydrochlorothiazide (MICROZIDE) 12.5 MG capsule Take 1 capsule (12.5 mg total) by mouth daily. 03/01/16   [name], MD  ibuprofen (ADVIL,MOTRIN) 600 MG tablet Take 1 tablet (600 mg total) by mouth every 8 (eight) hours as needed. 03/10/16   [name], NP  Linaclotide [facility](LINZESS) 145 MCG CAPS capsule Take 1 capsule (145 mcg total) by mouth daily. 10/25/15   [name], MD  potassium chloride SA (K-DUR,KLOR-CON) 20 MEQ tablet Take 1 tablet (20 mEq total) by mouth 2 (two) times daily. 12/16/15   [name], MD  triamcinolone cream (KENALOG) 0.5 % Apply 1 application topically 3 (three) times daily. 04/29/15  Etta Grandchild, MD  zolpidem (AMBIEN) 10 MG tablet TAKE 1 TABLET BY MOUTH EVERY DAY AT BEDTIME AS NEEDED FOR SLEEP 07/28/16   Etta Grandchild, MD    Family History Family History  Problem Relation Age of Onset  . Adopted: Yes  . Hypertension Sister   . COPD Neg Hx   . Cancer Neg Hx   . Early death Neg Hx   . Heart disease Neg Hx   . Hyperlipidemia Neg Hx   . Kidney disease Neg Hx   . Stroke Neg Hx     Social History Social History  Substance Use Topics  . Smoking status: Never  Smoker  . Smokeless tobacco: Never Used  . Alcohol use No     Comment: occasional     Allergies   Review of patient's allergies indicates no known allergies.   Review of Systems Review of Systems 10 systems reviewed and all are negative for acute change except as noted in the HPI.  Physical Exam Updated Vital Signs BP 153/91   Pulse 93   Temp 98 F (36.7 C)   Resp 18   Ht 5\' 6"  (1.676 m)   Wt 227 lb (103 kg)   LMP 09/03/2016   SpO2 98%   BMI 36.64 kg/m   Physical Exam  Constitutional: She is oriented to person, place, and time.  HENT:  Right Ear: External ear normal.  Left Ear: External ear normal.  Nose: Nose normal.  Mouth/Throat: Oropharynx is clear and moist. No oropharyngeal exudate.  Eyes: Conjunctivae are normal.  Neck: Neck supple.  Cardiovascular: Normal rate, regular rhythm, normal heart sounds and intact distal pulses.   Pulmonary/Chest: Effort normal and breath sounds normal. No respiratory distress. She has no wheezes.  Abdominal: Soft. Bowel sounds are normal. She exhibits no distension. There is tenderness in the left lower quadrant. There is no rebound and no guarding.  No CVA tenderness  Musculoskeletal: She exhibits no edema.  Lymphadenopathy:    She has no cervical adenopathy.  Neurological: She is alert and oriented to person, place, and time. No cranial nerve deficit.  Skin: Skin is warm and dry.  Psychiatric: She has a normal mood and affect.  Nursing note and vitals reviewed.   ED Treatments / Results  Labs (all labs ordered are listed, but only abnormal results are displayed) Labs Reviewed  URINALYSIS, ROUTINE W REFLEX MICROSCOPIC (NOT AT [facility]) - Abnormal; Notable for the following:       Result Value   APPearance CLOUDY (*)    Hgb urine dipstick LARGE (*)    Protein, ur 100 (*)    Leukocytes, UA MODERATE (*)    All other components within normal limits  URINE MICROSCOPIC-ADD ON - Abnormal; Notable for the following:    Squamous  Epithelial / LPF 6-30 (*)    Bacteria, UA FEW (*)    All other components within normal limits  COMPREHENSIVE METABOLIC PANEL - Abnormal; Notable for the following:    Potassium 3.3 (*)    Glucose, Bld 111 (*)    All other components within normal limits  CBC WITH DIFFERENTIAL/PLATELET - Abnormal; Notable for the following:    Hemoglobin 9.1 (*)    HCT 29.7 (*)    MCV 66.6 (*)    MCH 20.4 (*)    RDW 17.1 (*)    All other components within normal limits  PREGNANCY, URINE  LIPASE, BLOOD    EKG  EKG Interpretation None  Radiology Ct Abdomen Pelvis W Contrast  Result Date: 09/05/2016 CLINICAL DATA:  Initial evaluation for acute left lower quadrant abdominal pain. EXAM: CT ABDOMEN AND PELVIS WITH CONTRAST TECHNIQUE: Multidetector CT imaging of the abdomen and pelvis was performed using the standard protocol following bolus administration of intravenous contrast. CONTRAST:  ISOVUE-300 IOPAMIDOL (ISOVUE-300) INJECTION 61% COMPARISON:  Prior CT from 10/25/2011. FINDINGS: Lower chest: Visualized lung bases are clear. Hepatobiliary: Liver within normal limits. Multiple stones present within the gallbladder lumen. No CT findings to suggest acute cholecystitis. No biliary dilatation. Pancreas: Pancreas within normal limits. Spleen: Spleen within normal limits. Adrenals/Urinary Tract: Adrenal glands are normal. Kidneys equal in size with symmetric enhancement. No nephrolithiasis, hydronephrosis, or focal enhancing renal mass. Ureters of normal caliber. Bladder within normal limits. Stomach/Bowel: Small hiatal hernia noted. Stomach otherwise unremarkable. No evidence for bowel obstruction. Multiple small diverticula seen involving the ileum. No significant inflammatory changes to suggest acute diverticulitis. Appendix is normal. No abnormal wall thickening, mucosal enhancement, or inflammatory fat stranding seen about the bowels. Vascular/Lymphatic: Normal intravascular enhancement seen  throughout the intra-abdominal aorta and its branch vessels. No adenopathy. Reproductive: Uterus is somewhat enlarged projecting into the left lower quadrant of the abdomen. Hypodensity within the endometrial canal. Correlation with menstrual cycle recommended. 2.6 cm physiologic cyst present within the right ovary. Left ovary unremarkable. Other: No free air or fluid. Musculoskeletal: No acute osseus abnormality. No worrisome lytic or blastic osseous lesions. IMPRESSION: 1. No CT evidence for acute intra-abdominal or pelvic process. 2. Small bowel diverticulosis involving the ileum without findings to suggest acute diverticulitis. 3. Cholelithiasis. 4. Somewhat enlarged uterus projecting into the left lower quadrant with hypodensity within the endometrial canal. Correlation with menstrual cycle recommended. This could be further assessed with dedicated pelvic ultrasound as desired. Electronically Signed   By: Rise Mu M.D.   On: 09/05/2016 19:41    Procedures Procedures (including critical care time)  Medications Ordered in ED Medications  sodium chloride 0.9 % bolus 1,000 mL (0 mLs Intravenous Stopped 09/05/16 1757)  iopamidol (ISOVUE-300) 61 % injection 100 mL (100 mLs Intravenous Contrast Given 09/05/16 1900)  potassium chloride SA (K-DUR,KLOR-CON) CR tablet 40 mEq (40 mEq Oral Given 09/05/16 1953)    DIAGNOSTIC STUDIES:  Oxygen Saturation is 98% on RA, normal by my interpretation.    COORDINATION OF CARE:  4:29 PM Discussed treatment plan with pt at bedside and pt agreed to plan.  Initial Impression / Assessment and Plan / ED Course  I have reviewed the triage vital signs and the nursing notes.  Pertinent labs & imaging results that were available during my care of the patient were reviewed by me and considered in my medical decision making (see chart for details).  Clinical Course    Pt with microcytic anemia. States she has a history of iron deficiency anemia but stopped  taking supplements several months ago. Denies feeling faint or lightheaded. Denies heavy vaginal bleeding (is currently menstruating) or BRBPR/black tarry stools. She used to take Ferralet 90 but states it is too expensive. She is willing to pick up OTC iron supplements and stool softener which I encouraged. Her labs today otherwise with a  Mild hypokalemia which we repleted. CT negative for acute findings. Pt's pain has resolved on its own in the ED. Encouraged close PCP and OBGYN follow up. Strict ER return precautions given.  I personally performed the services described in this documentation, which was scribed in my presence. The recorded information has been reviewed and  is accurate.   Final Clinical Impressions(s) / ED Diagnoses   Final diagnoses:  Left lower quadrant pain  Iron deficiency anemia, unspecified iron deficiency anemia type    New Prescriptions Discharge Medication List as of 09/05/2016  8:27 PM       Carlene Coria, PA-C 09/05/16 2255    Gwyneth Sprout, MD 09/05/16 2327

## 2016-09-05 NOTE — Discharge Instructions (Signed)
Please pick up over the counter iron supplements and stool softeners and start taking. Please also follow up with your primary care provider as soon as possible. Return to the ER for new or worsening symptoms.

## 2016-09-05 NOTE — ED Triage Notes (Signed)
Pt in c/o LUQ abd pain x 1 week, some radiation to back. States she's had diverticulitis before. Pt is alert, interactive, ambulatory in NAD.

## 2016-09-17 ENCOUNTER — Other Ambulatory Visit: Payer: Self-pay | Admitting: Women's Health

## 2016-11-17 ENCOUNTER — Other Ambulatory Visit: Payer: Self-pay | Admitting: Internal Medicine

## 2016-11-17 DIAGNOSIS — G4700 Insomnia, unspecified: Secondary | ICD-10-CM

## 2016-11-18 NOTE — Telephone Encounter (Signed)
rx faxed to pharmacy

## 2016-11-30 ENCOUNTER — Other Ambulatory Visit: Payer: Self-pay | Admitting: Internal Medicine

## 2016-11-30 ENCOUNTER — Encounter: Payer: Self-pay | Admitting: Internal Medicine

## 2016-11-30 DIAGNOSIS — D510 Vitamin B12 deficiency anemia due to intrinsic factor deficiency: Secondary | ICD-10-CM

## 2016-12-14 ENCOUNTER — Encounter: Payer: BLUE CROSS/BLUE SHIELD | Admitting: Internal Medicine

## 2016-12-23 ENCOUNTER — Encounter: Payer: Self-pay | Admitting: Internal Medicine

## 2016-12-23 ENCOUNTER — Other Ambulatory Visit (INDEPENDENT_AMBULATORY_CARE_PROVIDER_SITE_OTHER): Payer: BLUE CROSS/BLUE SHIELD

## 2016-12-23 ENCOUNTER — Ambulatory Visit (INDEPENDENT_AMBULATORY_CARE_PROVIDER_SITE_OTHER): Payer: BLUE CROSS/BLUE SHIELD | Admitting: Internal Medicine

## 2016-12-23 VITALS — BP 138/80 | HR 88 | Temp 98.1°F | Resp 16 | Ht 66.0 in | Wt 249.0 lb

## 2016-12-23 DIAGNOSIS — Z0000 Encounter for general adult medical examination without abnormal findings: Secondary | ICD-10-CM

## 2016-12-23 DIAGNOSIS — D508 Other iron deficiency anemias: Secondary | ICD-10-CM | POA: Diagnosis not present

## 2016-12-23 DIAGNOSIS — R7309 Other abnormal glucose: Secondary | ICD-10-CM

## 2016-12-23 DIAGNOSIS — IMO0001 Reserved for inherently not codable concepts without codable children: Secondary | ICD-10-CM

## 2016-12-23 DIAGNOSIS — E876 Hypokalemia: Secondary | ICD-10-CM

## 2016-12-23 DIAGNOSIS — K5909 Other constipation: Secondary | ICD-10-CM

## 2016-12-23 DIAGNOSIS — I10 Essential (primary) hypertension: Secondary | ICD-10-CM

## 2016-12-23 DIAGNOSIS — D510 Vitamin B12 deficiency anemia due to intrinsic factor deficiency: Secondary | ICD-10-CM

## 2016-12-23 DIAGNOSIS — E669 Obesity, unspecified: Secondary | ICD-10-CM

## 2016-12-23 LAB — CBC WITH DIFFERENTIAL/PLATELET
Basophils Relative: 0.4 % (ref 0.0–3.0)
EOS PCT: 2.8 % (ref 0.0–5.0)
HEMATOCRIT: 32.5 % — AB (ref 36.0–46.0)
Hemoglobin: 9.9 g/dL — ABNORMAL LOW (ref 12.0–15.0)
Lymphocytes Relative: 42.5 % (ref 12.0–46.0)
MCHC: 30.3 g/dL (ref 30.0–36.0)
Monocytes Relative: 6.8 % (ref 3.0–12.0)
Neutrophils Relative %: 47.5 % (ref 43.0–77.0)
PLATELETS: 397 10*3/uL (ref 150.0–400.0)
RBC: 5.22 Mil/uL — ABNORMAL HIGH (ref 3.87–5.11)
RDW: 19.8 % — ABNORMAL HIGH (ref 11.5–15.5)
WBC: 8.4 10*3/uL (ref 4.0–10.5)

## 2016-12-23 LAB — LIPID PANEL
CHOLESTEROL: 201 mg/dL — AB (ref 0–200)
HDL: 36.8 mg/dL — AB (ref >39.00)
LDL Cholesterol: 134 mg/dL — ABNORMAL HIGH (ref 0–99)
NonHDL: 163.81
TRIGLYCERIDES: 148 mg/dL (ref 0.0–149.0)
Total CHOL/HDL Ratio: 5
VLDL: 29.6 mg/dL (ref 0.0–40.0)

## 2016-12-23 LAB — IBC PANEL
Iron: 17 ug/dL — ABNORMAL LOW (ref 42–145)
SATURATION RATIOS: 2.9 % — AB (ref 20.0–50.0)
TRANSFERRIN: 415 mg/dL — AB (ref 212.0–360.0)

## 2016-12-23 LAB — COMPREHENSIVE METABOLIC PANEL
ALBUMIN: 4 g/dL (ref 3.5–5.2)
ALK PHOS: 64 U/L (ref 39–117)
ALT: 39 U/L — AB (ref 0–35)
AST: 35 U/L (ref 0–37)
BILIRUBIN TOTAL: 0.4 mg/dL (ref 0.2–1.2)
BUN: 13 mg/dL (ref 6–23)
CALCIUM: 9.6 mg/dL (ref 8.4–10.5)
CO2: 32 meq/L (ref 19–32)
Chloride: 99 mEq/L (ref 96–112)
Creatinine, Ser: 0.62 mg/dL (ref 0.40–1.20)
GFR: 136.27 mL/min (ref >60.00)
Glucose, Bld: 109 mg/dL — ABNORMAL HIGH (ref 70–99)
Potassium: 3.5 mEq/L (ref 3.5–5.1)
Sodium: 138 mEq/L (ref 135–145)
TOTAL PROTEIN: 8.1 g/dL (ref 6.0–8.3)

## 2016-12-23 LAB — FERRITIN: Ferritin: 9 ng/mL — ABNORMAL LOW (ref 10.0–291.0)

## 2016-12-23 LAB — HEMOGLOBIN A1C: HEMOGLOBIN A1C: 6.2 % (ref 4.6–6.5)

## 2016-12-23 LAB — TSH: TSH: 1.85 u[IU]/mL (ref 0.35–4.50)

## 2016-12-23 LAB — MAGNESIUM: Magnesium: 1.8 mg/dL (ref 1.5–2.5)

## 2016-12-23 MED ORDER — PHENTERMINE HCL 37.5 MG PO CAPS: 37.5000 mg | ORAL_CAPSULE | ORAL | 1 refills | Status: DC

## 2016-12-23 MED ORDER — LINACLOTIDE 145 MCG PO CAPS: 145.0000 ug | ORAL_CAPSULE | Freq: Every day | ORAL | 1 refills | Status: DC

## 2016-12-23 MED ORDER — FERRALET 90 90-1 MG PO TABS: 1.0000 | ORAL_TABLET | Freq: Every day | ORAL | 1 refills | Status: DC

## 2016-12-23 NOTE — Progress Notes (Signed)
Pre visit review using our clinic review tool, if applicable. No additional management support is needed unless otherwise documented below in the visit note. 

## 2016-12-23 NOTE — Patient Instructions (Addendum)

## 2016-12-23 NOTE — Progress Notes (Signed)
Subjective:  Patient ID: Faith Sellers, female    DOB: 1976-02-19  Age: 41 y.o. MRN: [id number]  CC: Hypertension; Annual Exam; and Anemia   HPI Faith Sellers presents for a CPX.  She is concerned about her anemia and tells me she is symptomatic. She is not taking Ferralet because she didn't have the money to buy it. She tells me that her menstrual cycles are heavy. She complains of feeling faint, short of breath, and fatigued. She also occasionally suffers from constipation but says Linzess is very effective for treating the constipation. She tells me her blood pressure has been well controlled and she has had no recent episodes of chest pain, palpitations, or edema.  She complains of weight gain and wants to try a course of phentermine and to be referred to the considered for bariatric surgery.  Outpatient Medications Prior to Visit  Medication Sig Dispense Refill  . cholecalciferol (VITAMIN D) 1000 units tablet Take 1,000 Units by mouth daily.    . hydrochlorothiazide (MICROZIDE) 12.5 MG capsule Take 1 capsule (12.5 mg total) by mouth daily. 90 capsule 1  . potassium chloride SA (K-DUR,KLOR-CON) 20 MEQ tablet Take 1 tablet (20 mEq total) by mouth 2 (two) times daily. 60 tablet 5  . triamcinolone cream (KENALOG) 0.5 % Apply 1 application topically 3 (three) times daily. 30 g 1  . zolpidem (AMBIEN) 10 MG tablet TAKE 1 TABLET BY MOUTH EVERY DAY AT BEDTIME AS NEEDED FOR SLEEP 30 tablet 2  . ALPRAZolam (XANAX) 1 MG tablet TAKE 1 TABLET BY MOUTH FOUR TIMES DAILY AS NEEDED 30 tablet 0  . [name] (FERRALET 90) 90-1 MG TABS TAKE 1 TABLET BY MOUTH DAILY 90 each 1  . ibuprofen (ADVIL,MOTRIN) 600 MG tablet TAKE 1 TABLET(600 MG) BY MOUTH EVERY 8 HOURS AS NEEDED 60 tablet 1  . Linaclotide (LINZESS) 145 MCG CAPS capsule Take 1 capsule (145 mcg total) by mouth daily. 90 capsule 0   No facility-administered medications prior to visit.     ROS Review of Systems  Constitutional:  Positive for fatigue and unexpected weight change. Negative for activity change, appetite change, chills, diaphoresis and fever.  HENT: Negative.  Negative for trouble swallowing.   Eyes: Negative for visual disturbance.  Respiratory: Negative for cough, chest tightness, shortness of breath and wheezing.   Cardiovascular: Negative for chest pain, palpitations and leg swelling.  Gastrointestinal: Positive for constipation. Negative for abdominal pain, anal bleeding, blood in stool, diarrhea, nausea and vomiting.  Endocrine: Negative.  Negative for cold intolerance and heat intolerance.  Genitourinary: Positive for menstrual problem. Negative for decreased urine volume, difficulty urinating, dysuria, flank pain, frequency and urgency.  Musculoskeletal: Negative.  Negative for arthralgias, back pain, myalgias and neck pain.  Skin: Positive for pallor. Negative for color change and rash.  Allergic/Immunologic: Negative.   Neurological: Negative.  Negative for dizziness, tremors, weakness, numbness and headaches.  Hematological: Negative.  Negative for adenopathy. Does not bruise/bleed easily.  Psychiatric/Behavioral: Negative.  Negative for behavioral problems. The patient is not nervous/anxious.     Objective:  BP 138/80 (BP Location: Right Arm, Cuff Size: Large)   Pulse 88   Temp 98.1 F (36.7 C) (Oral)   Resp 16   Ht 5\' 6"  (1.676 m)   Wt 249 lb (112.9 kg)   LMP 12/07/2016   SpO2 98%   BMI 40.19 kg/m   BP Readings from Last 3 Encounters:  12/23/16 138/80  09/05/16 142/84  02/10/16 134/90    Wt  Readings from Last 3 Encounters:  12/23/16 249 lb (112.9 kg)  09/05/16 227 lb (103 kg)  02/10/16 245 lb (111.1 kg)    Physical Exam  Constitutional: She is oriented to person, place, and time. No distress.  HENT:  Head: Normocephalic and atraumatic.  Mouth/Throat: Oropharynx is clear and moist. No oropharyngeal exudate.  Eyes: Conjunctivae are normal. Right eye exhibits no discharge.  Left eye exhibits no discharge. No scleral icterus.  Neck: Normal range of motion. Neck supple. No JVD present. No tracheal deviation present. No thyromegaly present.  Cardiovascular: Normal rate, regular rhythm, normal heart sounds and intact distal pulses.  Exam reveals no gallop and no friction rub.   No murmur heard. Pulmonary/Chest: Effort normal and breath sounds normal. No stridor. No respiratory distress. She has no wheezes. She has no rales. She exhibits no tenderness.  Abdominal: Soft. Bowel sounds are normal. She exhibits no distension and no mass. There is no tenderness. There is no rebound and no guarding.  Musculoskeletal: Normal range of motion. She exhibits no edema, tenderness or deformity.  Lymphadenopathy:    She has no cervical adenopathy.  Neurological: She is oriented to person, place, and time.  Skin: Skin is warm and dry. No rash noted. She is not diaphoretic. No erythema. No pallor.  Psychiatric: She has a normal mood and affect. Her behavior is normal. Judgment and thought content normal.  Vitals reviewed.   Lab Results  Component Value Date   WBC 8.4 12/23/2016   HGB 9.9 (L) 12/23/2016   HCT 32.5 (L) 12/23/2016   PLT 397.0 12/23/2016   GLUCOSE 109 (H) 12/23/2016   CHOL 201 (H) 12/23/2016   TRIG 148.0 12/23/2016   HDL 36.80 (L) 12/23/2016   LDLDIRECT 151.2 07/19/2013   LDLCALC 134 (H) 12/23/2016   ALT 39 (H) 12/23/2016   AST 35 12/23/2016   NA 138 12/23/2016   K 3.5 12/23/2016   CL 99 12/23/2016   CREATININE 0.62 12/23/2016   BUN 13 12/23/2016   CO2 32 12/23/2016   TSH 1.85 12/23/2016   INR 0.96 08/28/2011   HGBA1C 6.2 12/23/2016    Ct Abdomen Pelvis W Contrast  Result Date: 09/05/2016 CLINICAL DATA:  Initial evaluation for acute left lower quadrant abdominal pain. EXAM: CT ABDOMEN AND PELVIS WITH CONTRAST TECHNIQUE: Multidetector CT imaging of the abdomen and pelvis was performed using the standard protocol following bolus administration of  intravenous contrast. CONTRAST:  100mL ISOVUE-300 IOPAMIDOL (ISOVUE-300) INJECTION 61% COMPARISON:  Prior CT from 10/25/2011. FINDINGS: Lower chest: Visualized lung bases are clear. Hepatobiliary: Liver within normal limits. Multiple stones present within the gallbladder lumen. No CT findings to suggest acute cholecystitis. No biliary dilatation. Pancreas: Pancreas within normal limits. Spleen: Spleen within normal limits. Adrenals/Urinary Tract: Adrenal glands are normal. Kidneys equal in size with symmetric enhancement. No nephrolithiasis, hydronephrosis, or focal enhancing renal mass. Ureters of normal caliber. Bladder within normal limits. Stomach/Bowel: Small hiatal hernia noted. Stomach otherwise unremarkable. No evidence for bowel obstruction. Multiple small diverticula seen involving the ileum. No significant inflammatory changes to suggest acute diverticulitis. Appendix is normal. No abnormal wall thickening, mucosal enhancement, or inflammatory fat stranding seen about the bowels. Vascular/Lymphatic: Normal intravascular enhancement seen throughout the intra-abdominal aorta and its branch vessels. No adenopathy. Reproductive: Uterus is somewhat enlarged projecting into the left lower quadrant of the abdomen. Hypodensity within the endometrial canal. Correlation with menstrual cycle recommended. 2.6 cm physiologic cyst present within the right ovary. Left ovary unremarkable. Other: No free air or fluid.  Musculoskeletal: No acute osseus abnormality. No worrisome lytic or blastic osseous lesions. IMPRESSION: 1. No CT evidence for acute intra-abdominal or pelvic process. 2. Small bowel diverticulosis involving the ileum without findings to suggest acute diverticulitis. 3. Cholelithiasis. 4. Somewhat enlarged uterus projecting into the left lower quadrant with hypodensity within the endometrial canal. Correlation with menstrual cycle recommended. This could be further assessed with dedicated pelvic ultrasound  as desired. Electronically Signed   By: Rise Mu M.D.   On: 09/05/2016 19:41    Assessment & Plan:   [place] was seen today for hypertension, annual exam and anemia.  Diagnoses and all orders for this visit:  Essential hypertension, benign- her blood pressures adequately well-controlled, electrolytes and renal function are stable.  Other iron deficiency anemia- her anemia is some better but she is still anemic and has iron deficiency anemia so I gave her samples and a co-pay card to restart Ferralet -     Ferritin; Future -     IBC panel; Future -     [name] (FERRALET 90) 90-1 MG TABS; Take 1 tablet by mouth daily.  Other abnormal glucose- her A1c is up to 6.2%, she is prediabetic, no medications are needed at this time, she agrees to work on her lifestyle modifications -     Hemoglobin A1c; Future  Routine general medical examination at a health care facility- Exam completed, labs ordered and reviewed, she refused a flu vaccine today, her mammogram is scheduled for March of this year, her Pap smear is up-to-date, patient education material was given -     CBC with Differential/Platelet; Future -     Comprehensive metabolic panel; Future -     Lipid panel; Future -     TSH; Future  Hypokalemia- her potassium level is barely in the normal range at 3.5*of asked her to continue potassium replacement therapy. -     Magnesium; Future  Pernicious anemia  Constipation, chronic -     linaclotide (LINZESS) 145 MCG CAPS capsule; Take 1 capsule (145 mcg total) by mouth daily.  Obesity, Class II, BMI 35-39.9, with comorbidity -     Ambulatory referral to General Surgery -     phentermine 37.5 MG capsule; Take 1 capsule (37.5 mg total) by mouth every morning.   I have discontinued Ms. Farish's ALPRAZolam and ibuprofen. I am also having her start on phentermine. Additionally, I am having her maintain her triamcinolone cream, potassium chloride SA,  cholecalciferol, hydrochlorothiazide, zolpidem, FERRALET 90, and linaclotide.  Meds ordered this encounter  Medications  . [name] (FERRALET 90) 90-1 MG TABS    Sig: Take 1 tablet by mouth daily.    Dispense:  90 each    Refill:  1  . linaclotide (LINZESS) 145 MCG CAPS capsule    Sig: Take 1 capsule (145 mcg total) by mouth daily.    Dispense:  90 capsule    Refill:  1  . phentermine 37.5 MG capsule    Sig: Take 1 capsule (37.5 mg total) by mouth every morning.    Dispense:  30 capsule    Refill:  1     Follow-up: Return in about 6 weeks (around 02/03/2017).  Sanda Linger, MD

## 2017-02-15 ENCOUNTER — Other Ambulatory Visit: Payer: Self-pay | Admitting: Internal Medicine

## 2017-02-15 DIAGNOSIS — G4700 Insomnia, unspecified: Secondary | ICD-10-CM

## 2017-02-15 NOTE — Telephone Encounter (Signed)
Faxed script back to [facility].../lmb 

## 2017-02-22 ENCOUNTER — Encounter: Payer: Self-pay | Admitting: Women's Health

## 2017-04-13 ENCOUNTER — Encounter: Payer: Self-pay | Admitting: Gynecology

## 2017-05-12 ENCOUNTER — Other Ambulatory Visit: Payer: Self-pay | Admitting: Internal Medicine

## 2017-05-12 ENCOUNTER — Ambulatory Visit: Payer: BLUE CROSS/BLUE SHIELD | Admitting: Internal Medicine

## 2017-05-12 DIAGNOSIS — G4700 Insomnia, unspecified: Secondary | ICD-10-CM

## 2017-05-14 ENCOUNTER — Other Ambulatory Visit: Payer: Self-pay | Admitting: Internal Medicine

## 2017-05-14 DIAGNOSIS — G4700 Insomnia, unspecified: Secondary | ICD-10-CM

## 2017-05-16 NOTE — Telephone Encounter (Signed)
Faxed script back to [facility].../lmb 

## 2017-05-17 ENCOUNTER — Ambulatory Visit: Payer: BLUE CROSS/BLUE SHIELD | Admitting: Internal Medicine

## 2017-06-07 ENCOUNTER — Telehealth: Payer: Self-pay | Admitting: Internal Medicine

## 2017-06-07 NOTE — Telephone Encounter (Signed)
Pt is needing a refill on her hydrochlorothiazide (MICROZIDE) 12.[facility] MG [facility] [place] She does have an appointment scheduled on 07/18 with Dr Yetta BarreJones but she is completely out of this medication.

## 2017-06-08 ENCOUNTER — Encounter: Payer: Self-pay | Admitting: Internal Medicine

## 2017-06-08 DIAGNOSIS — I10 Essential (primary) hypertension: Secondary | ICD-10-CM

## 2017-06-08 MED ORDER — HYDROCHLOROTHIAZIDE 12.5 MG PO CAPS: 12.5000 mg | ORAL_CAPSULE | Freq: Every day | ORAL | 0 refills | Status: DC

## 2017-06-09 NOTE — Telephone Encounter (Signed)
This has been sent

## 2017-06-15 ENCOUNTER — Encounter: Payer: Self-pay | Admitting: Internal Medicine

## 2017-06-15 ENCOUNTER — Ambulatory Visit (INDEPENDENT_AMBULATORY_CARE_PROVIDER_SITE_OTHER): Payer: BLUE CROSS/BLUE SHIELD | Admitting: Internal Medicine

## 2017-06-15 ENCOUNTER — Other Ambulatory Visit (INDEPENDENT_AMBULATORY_CARE_PROVIDER_SITE_OTHER): Payer: BLUE CROSS/BLUE SHIELD

## 2017-06-15 VITALS — BP 150/100 | HR 78 | Temp 98.0°F | Resp 16 | Ht 66.0 in | Wt 241.0 lb

## 2017-06-15 DIAGNOSIS — B356 Tinea cruris: Secondary | ICD-10-CM | POA: Diagnosis not present

## 2017-06-15 DIAGNOSIS — I10 Essential (primary) hypertension: Secondary | ICD-10-CM | POA: Diagnosis not present

## 2017-06-15 DIAGNOSIS — E876 Hypokalemia: Secondary | ICD-10-CM

## 2017-06-15 DIAGNOSIS — D500 Iron deficiency anemia secondary to blood loss (chronic): Secondary | ICD-10-CM

## 2017-06-15 LAB — CBC WITH DIFFERENTIAL/PLATELET
Basophils Absolute: 0 10*3/uL (ref 0.0–0.1)
Basophils Relative: 0.5 % (ref 0.0–3.0)
EOS ABS: 0.2 10*3/uL (ref 0.0–0.7)
EOS PCT: 2.2 % (ref 0.0–5.0)
HEMATOCRIT: 39.5 % (ref 36.0–46.0)
Hemoglobin: 13.2 g/dL (ref 12.0–15.0)
Lymphocytes Relative: 40.3 % (ref 12.0–46.0)
Lymphs Abs: 3.2 10*3/uL (ref 0.7–4.0)
MCHC: 33.4 g/dL (ref 30.0–36.0)
MCV: 83.1 fl (ref 78.0–100.0)
MONO ABS: 0.6 10*3/uL (ref 0.1–1.0)
Monocytes Relative: 7 % (ref 3.0–12.0)
NEUTROS ABS: 4 10*3/uL (ref 1.4–7.7)
Neutrophils Relative %: 50 % (ref 43.0–77.0)
PLATELETS: 311 10*3/uL (ref 150.0–400.0)
RBC: 4.75 Mil/uL (ref 3.87–5.11)
RDW: 16.7 % — AB (ref 11.5–15.5)
WBC: 7.9 10*3/uL (ref 4.0–10.5)

## 2017-06-15 LAB — BASIC METABOLIC PANEL
BUN: 9 mg/dL (ref 6–23)
CHLORIDE: 97 meq/L (ref 96–112)
CO2: 32 meq/L (ref 19–32)
Calcium: 9.6 mg/dL (ref 8.4–10.5)
Creatinine, Ser: 0.61 mg/dL (ref 0.40–1.20)
GFR: 138.53 mL/min (ref >60.00)
Glucose, Bld: 108 mg/dL — ABNORMAL HIGH (ref 70–99)
POTASSIUM: 3.1 meq/L — AB (ref 3.5–5.1)
Sodium: 138 mEq/L (ref 135–145)

## 2017-06-15 LAB — IBC PANEL
IRON: 92 ug/dL (ref 42–145)
Saturation Ratios: 20.3 % (ref 20.0–50.0)
Transferrin: 323 mg/dL (ref 212.0–360.0)

## 2017-06-15 LAB — FERRITIN: FERRITIN: 67.5 ng/mL (ref 10.0–291.0)

## 2017-06-15 MED ORDER — CICLOPIROX 0.77 % EX GEL: 1.0000 | Freq: Two times a day (BID) | CUTANEOUS | 2 refills | Status: DC

## 2017-06-15 MED ORDER — SPIRONOLACTONE 25 MG PO TABS: 25.0000 mg | ORAL_TABLET | Freq: Every day | ORAL | 1 refills | Status: DC

## 2017-06-15 NOTE — Progress Notes (Signed)
Subjective:  Patient ID: Faith Sellers, female    DOB: 12/17/1975  Age: 41 y.o. MRN: [id number]  CC: Anemia; Hypertension; and Rash   HPI Faith Sellers presents for f/up - She complains of recurrent rash in her upper groin under her panniculus. She tells me the area occasionally gets red and itchy. She has treated it with Vaseline which sooths the area but doesn't make the rash go away.  Her blood pressure has not been well controlled with hydrochlorothiazide and she continues to struggle with hypokalemia. She's had no recent episodes of headache/blurred vision/chest pain/shortness of breath/or fatigue. She occasionally notices edema around her ankles.  She continues to have menstrual cycles that last for several weeks. She is scheduled soon to see her gynecologist to see if something can be done about this.  Outpatient Medications Prior to Visit  Medication Sig Dispense Refill  . cholecalciferol (VITAMIN D) 1000 units tablet Take 1,000 Units by mouth daily.    Marland Kitchen. [name] (FERRALET 90) 90-1 MG TABS Take 1 tablet by mouth daily. 90 each 1  . linaclotide (LINZESS) 145 MCG CAPS capsule Take 1 capsule (145 mcg total) by mouth daily. 90 capsule 1  . potassium chloride SA (K-DUR,KLOR-CON) 20 MEQ tablet Take 1 tablet (20 mEq total) by mouth 2 (two) times daily. 60 tablet 5  . zolpidem (AMBIEN) 10 MG tablet TAKE 1 TABLET BY MOUTH EVERY DAY AT BEDTIME AS NEEDED FOR SLEEP 30 tablet 2  . hydrochlorothiazide (MICROZIDE) 12.5 MG capsule Take 1 capsule (12.5 mg total) by mouth daily. Must keep 06/15/17 appt for future refills 7 capsule 0  . phentermine 37.5 MG capsule Take 1 capsule (37.5 mg total) by mouth every morning. 30 capsule 1  . triamcinolone cream (KENALOG) 0.5 % Apply 1 application topically 3 (three) times daily. 30 g 1   No facility-administered medications prior to visit.     ROS Review of Systems  Constitutional: Negative.  Negative for appetite change, chills,  diaphoresis, fatigue and unexpected weight change.  HENT: Negative.   Eyes: Negative for visual disturbance.  Respiratory: Negative for cough, chest tightness, shortness of breath and wheezing.   Cardiovascular: Negative for chest pain, palpitations and leg swelling.  Gastrointestinal: Negative for abdominal pain, constipation, diarrhea, nausea and vomiting.  Endocrine: Negative.   Genitourinary: Negative.   Musculoskeletal: Negative.  Negative for arthralgias, back pain and myalgias.  Skin: Positive for rash. Negative for color change and pallor.  Allergic/Immunologic: Negative.   Neurological: Negative.  Negative for dizziness, syncope, speech difficulty, weakness, light-headedness and headaches.  Hematological: Negative for adenopathy. Does not bruise/bleed easily.  Psychiatric/Behavioral: Negative.     Objective:  BP (!) 150/100 (BP Location: Left Arm, Patient Position: Sitting, Cuff Size: Large)   Pulse 78   Temp 98 F (36.7 C) (Oral)   Ht 5\' 6"  (1.676 m)   Wt 241 lb (109.3 kg)   SpO2 100%   BMI 38.90 kg/m   BP Readings from Last 3 Encounters:  06/15/17 (!) 150/100  12/23/16 138/80  09/05/16 142/84    Wt Readings from Last 3 Encounters:  06/15/17 241 lb (109.3 kg)  12/23/16 249 lb (112.9 kg)  09/05/16 227 lb (103 kg)    Physical Exam  Constitutional: She is oriented to person, place, and time. No distress.  HENT:  Mouth/Throat: Oropharynx is clear and moist.  Eyes: Conjunctivae are normal. Right eye exhibits no discharge. Left eye exhibits no discharge. No scleral icterus.  Neck: Normal range of motion.  Neck supple. No JVD present. No thyromegaly present.  Cardiovascular: Normal rate, regular rhythm and intact distal pulses.  Exam reveals no gallop and no friction rub.   No murmur heard. Pulmonary/Chest: Effort normal and breath sounds normal. No respiratory distress. She has no wheezes. She has no rales. She exhibits no tenderness.  Abdominal: Soft. Bowel sounds  are normal. She exhibits no distension and no mass. There is no tenderness. There is no rebound and no guarding.  Musculoskeletal: Normal range of motion. She exhibits no edema or tenderness.  Lymphadenopathy:    She has no cervical adenopathy.  Neurological: She is oriented to person, place, and time.  Skin: Skin is warm and dry. Rash noted. She is not diaphoretic. No erythema. No pallor.  The intertriginous area in the upper groin shows hyperpigmentation and faint erythema, this is diffuse, bilateral, and confluent.  Vitals reviewed.   Lab Results  Component Value Date   WBC 7.9 06/15/2017   HGB 13.2 06/15/2017   HCT 39.5 06/15/2017   PLT 311.0 06/15/2017   GLUCOSE 108 (H) 06/15/2017   CHOL 201 (H) 12/23/2016   TRIG 148.0 12/23/2016   HDL 36.80 (L) 12/23/2016   LDLDIRECT 151.2 07/19/2013   LDLCALC 134 (H) 12/23/2016   ALT 39 (H) 12/23/2016   AST 35 12/23/2016   NA 138 06/15/2017   K 3.1 (L) 06/15/2017   CL 97 06/15/2017   CREATININE 0.61 06/15/2017   BUN 9 06/15/2017   CO2 32 06/15/2017   TSH 1.85 12/23/2016   INR 0.96 08/28/2011   HGBA1C 6.2 12/23/2016    Ct Abdomen Pelvis W Contrast  Result Date: 09/05/2016 CLINICAL DATA:  Initial evaluation for acute left lower quadrant abdominal pain. EXAM: CT ABDOMEN AND PELVIS WITH CONTRAST TECHNIQUE: Multidetector CT imaging of the abdomen and pelvis was performed using the standard protocol following bolus administration of intravenous contrast. CONTRAST:  ISOVUE-300 IOPAMIDOL (ISOVUE-300) INJECTION 61% COMPARISON:  Prior CT from 10/25/2011. FINDINGS: Lower chest: Visualized lung bases are clear. Hepatobiliary: Liver within normal limits. Multiple stones present within the gallbladder lumen. No CT findings to suggest acute cholecystitis. No biliary dilatation. Pancreas: Pancreas within normal limits. Spleen: Spleen within normal limits. Adrenals/Urinary Tract: Adrenal glands are normal. Kidneys equal in size with symmetric  enhancement. No nephrolithiasis, hydronephrosis, or focal enhancing renal mass. Ureters of normal caliber. Bladder within normal limits. Stomach/Bowel: Small hiatal hernia noted. Stomach otherwise unremarkable. No evidence for bowel obstruction. Multiple small diverticula seen involving the ileum. No significant inflammatory changes to suggest acute diverticulitis. Appendix is normal. No abnormal wall thickening, mucosal enhancement, or inflammatory fat stranding seen about the bowels. Vascular/Lymphatic: Normal intravascular enhancement seen throughout the intra-abdominal aorta and its branch vessels. No adenopathy. Reproductive: Uterus is somewhat enlarged projecting into the left lower quadrant of the abdomen. Hypodensity within the endometrial canal. Correlation with menstrual cycle recommended. 2.6 cm physiologic cyst present within the right ovary. Left ovary unremarkable. Other: No free air or fluid. Musculoskeletal: No acute osseus abnormality. No worrisome lytic or blastic osseous lesions. IMPRESSION: 1. No CT evidence for acute intra-abdominal or pelvic process. 2. Small bowel diverticulosis involving the ileum without findings to suggest acute diverticulitis. 3. Cholelithiasis. 4. Somewhat enlarged uterus projecting into the left lower quadrant with hypodensity within the endometrial canal. Correlation with menstrual cycle recommended. This could be further assessed with dedicated pelvic ultrasound as desired. Electronically Signed   By: Rise Mu M.D.   On: 09/05/2016 19:41    Assessment & Plan:  Faith Sellers was seen today for anemia, hypertension and rash.  Diagnoses and all orders for this visit:  Iron deficiency anemia due to chronic blood loss- her H&H and iron levels are normal now. Since she continues to have heavy menstrual cycles so I have asked her to continue the iron replacement therapy. -     CBC with Differential/Platelet; Future -     IBC panel; Future -     Ferritin;  Future  Essential hypertension, benign- her blood pressure is not adequately well controlled and she continues to be hypokalemic. I've asked her to start a potassium sparing diuretic. -     Basic metabolic panel; Future -     spironolactone (ALDACTONE) 25 MG tablet; Take 1 tablet (25 mg total) by mouth daily.  Hypokalemia -     spironolactone (ALDACTONE) 25 MG tablet; Take 1 tablet (25 mg total) by mouth daily.  Tinea cruris -     Ciclopirox 0.77 % gel; Apply 1 Act topically 2 (two) times daily.   I have discontinued Ms. Bordon's triamcinolone cream, phentermine, and hydrochlorothiazide. I am also having her start on spironolactone and Ciclopirox. Additionally, I am having her maintain her potassium chloride SA, cholecalciferol, FERRALET 90, linaclotide, and zolpidem.  Meds ordered this encounter  Medications  . spironolactone (ALDACTONE) 25 MG tablet    Sig: Take 1 tablet (25 mg total) by mouth daily.    Dispense:  90 tablet    Refill:  1  . Ciclopirox 0.77 % gel    Sig: Apply 1 Act topically 2 (two) times daily.    Dispense:  100 g    Refill:  2     Follow-up: Return in about 2 months (around 08/16/2017).  Sanda Linger, MD

## 2017-06-15 NOTE — Patient Instructions (Signed)

## 2017-06-26 ENCOUNTER — Other Ambulatory Visit: Payer: Self-pay | Admitting: Internal Medicine

## 2017-06-26 DIAGNOSIS — E876 Hypokalemia: Secondary | ICD-10-CM

## 2017-06-26 DIAGNOSIS — I10 Essential (primary) hypertension: Secondary | ICD-10-CM

## 2017-06-26 MED ORDER — HYDROCHLOROTHIAZIDE 12.5 MG PO CAPS: 12.5000 mg | ORAL_CAPSULE | Freq: Every day | ORAL | 1 refills | Status: DC

## 2017-06-26 MED ORDER — POTASSIUM CHLORIDE CRYS ER 20 MEQ PO TBCR
20.0000 meq | EXTENDED_RELEASE_TABLET | Freq: Three times a day (TID) | ORAL | 1 refills | Status: DC
Start: 2017-06-26 — End: 2017-10-25

## 2017-06-28 ENCOUNTER — Other Ambulatory Visit: Payer: Self-pay | Admitting: Women's Health

## 2017-06-28 ENCOUNTER — Encounter: Payer: Self-pay | Admitting: Women's Health

## 2017-06-28 MED ORDER — IBUPROFEN 800 MG PO TABS: 800.0000 mg | ORAL_TABLET | Freq: Three times a day (TID) | ORAL | 1 refills | Status: DC | PRN

## 2017-07-12 ENCOUNTER — Encounter: Payer: Self-pay | Admitting: Internal Medicine

## 2017-07-25 ENCOUNTER — Encounter: Payer: BLUE CROSS/BLUE SHIELD | Admitting: Women's Health

## 2017-08-09 ENCOUNTER — Encounter: Payer: BLUE CROSS/BLUE SHIELD | Admitting: Women's Health

## 2017-08-15 ENCOUNTER — Other Ambulatory Visit: Payer: Self-pay | Admitting: Internal Medicine

## 2017-08-15 DIAGNOSIS — G4700 Insomnia, unspecified: Secondary | ICD-10-CM

## 2017-08-16 NOTE — Telephone Encounter (Signed)
Rx faxed to [facility]

## 2017-09-07 ENCOUNTER — Encounter: Payer: BLUE CROSS/BLUE SHIELD | Admitting: Women's Health

## 2017-09-08 ENCOUNTER — Other Ambulatory Visit: Payer: Self-pay | Admitting: Women's Health

## 2017-09-09 NOTE — Telephone Encounter (Signed)
Last CE was 02/10/16. Overdue but has appt scheduled 09/20/17.

## 2017-09-13 ENCOUNTER — Encounter: Payer: Self-pay | Admitting: Internal Medicine

## 2017-09-20 ENCOUNTER — Encounter: Payer: BLUE CROSS/BLUE SHIELD | Admitting: Women's Health

## 2017-10-25 ENCOUNTER — Encounter: Payer: Self-pay | Admitting: Women's Health

## 2017-10-25 ENCOUNTER — Ambulatory Visit (INDEPENDENT_AMBULATORY_CARE_PROVIDER_SITE_OTHER): Payer: BLUE CROSS/BLUE SHIELD | Admitting: Women's Health

## 2017-10-25 VITALS — BP 110/78 | Ht 66.0 in | Wt 238.0 lb

## 2017-10-25 DIAGNOSIS — Z01419 Encounter for gynecological examination (general) (routine) without abnormal findings: Secondary | ICD-10-CM

## 2017-10-25 DIAGNOSIS — Z113 Encounter for screening for infections with a predominantly sexual mode of transmission: Secondary | ICD-10-CM

## 2017-10-25 DIAGNOSIS — N938 Other specified abnormal uterine and vaginal bleeding: Secondary | ICD-10-CM

## 2017-10-25 MED ORDER — DIAZEPAM 5 MG PO TABS: 5.0000 mg | ORAL_TABLET | Freq: Four times a day (QID) | ORAL | 0 refills | Status: DC | PRN

## 2017-10-25 NOTE — Patient Instructions (Addendum)
DASH Eating Plan DASH stands for "Dietary Approaches to Stop Hypertension." The DASH eating plan is a healthy eating plan that has been shown to reduce high blood pressure (hypertension). It may also reduce your risk for type 2 diabetes, heart disease, and stroke. The DASH eating plan may also help with weight loss. What are tips for following this plan? General guidelines  Avoid eating more than 2,300 mg (milligrams) of salt (sodium) a day. If you have hypertension, you may need to reduce your sodium intake to 1,500 mg a day.  Limit alcohol intake to no more than 1 drink a day for nonpregnant women and 2 drinks a day for men. One drink equals 12 oz of beer, 5 oz of wine, or 1 oz of hard liquor.  Work with your health care provider to maintain a healthy body weight or to lose weight. Ask what an ideal weight is for you.  Get at least 30 minutes of exercise that causes your heart to beat faster (aerobic exercise) most days of the week. Activities may include walking, swimming, or biking.  Work with your health care provider or diet and nutrition specialist (dietitian) to adjust your eating plan to your individual calorie needs. Reading food labels  Check food labels for the amount of sodium per serving. Choose foods with less than 5 percent of the Daily Value of sodium. Generally, foods with less than 300 mg of sodium per serving fit into this eating plan.  To find whole grains, look for the word "whole" as the first word in the ingredient list. Shopping  Buy products labeled as "low-sodium" or "no salt added."  Buy fresh foods. Avoid canned foods and premade or frozen meals. Cooking  Avoid adding salt when cooking. Use salt-free seasonings or herbs instead of table salt or sea salt. Check with your health care provider or pharmacist before using salt substitutes.  Do not fry foods. Cook foods using healthy methods such as baking, boiling, grilling, and broiling instead.  Cook with  heart-healthy oils, such as olive, canola, soybean, or sunflower oil. Meal planning   Eat a balanced diet that includes: ? 5 or more servings of fruits and vegetables each day. At each meal, try to fill half of your plate with fruits and vegetables. ? Up to 6-8 servings of whole grains each day. ? Less than 6 oz of lean meat, poultry, or fish each day. A 3-oz serving of meat is about the same size as a deck of cards. One egg equals 1 oz. ? 2 servings of low-fat dairy each day. ? A serving of nuts, seeds, or beans 5 times each week. ? Heart-healthy fats. Healthy fats called Omega-3 fatty acids are found in foods such as flaxseeds and coldwater fish, like sardines, salmon, and mackerel.  Limit how much you eat of the following: ? Canned or prepackaged foods. ? Food that is high in trans fat, such as fried foods. ? Food that is high in saturated fat, such as fatty meat. ? Sweets, desserts, sugary drinks, and other foods with added sugar. ? Full-fat dairy products.  Do not salt foods before eating.  Try to eat at least 2 vegetarian meals each week.  Eat more home-cooked food and less restaurant, buffet, and fast food.  When eating at a restaurant, ask that your food be prepared with less salt or no salt, if possible. What foods are recommended? The items listed may not be a complete list. Talk with your dietitian about what   dietary choices are best for you. Grains Whole-grain or whole-wheat bread. Whole-grain or whole-wheat pasta. Brown rice. Oatmeal. Quinoa. Bulgur. Whole-grain and low-sodium cereals. Pita bread. Low-fat, low-sodium crackers. Whole-wheat flour tortillas. Vegetables Fresh or frozen vegetables (raw, steamed, roasted, or grilled). Low-sodium or reduced-sodium tomato and vegetable juice. Low-sodium or reduced-sodium tomato sauce and tomato paste. Low-sodium or reduced-sodium canned vegetables. Fruits All fresh, dried, or frozen fruit. Canned fruit in natural juice (without  added sugar). Meat and other protein foods Skinless chicken or turkey. Ground chicken or turkey. Pork with fat trimmed off. Fish and seafood. Egg whites. Dried beans, peas, or lentils. Unsalted nuts, nut butters, and seeds. Unsalted canned beans. Lean cuts of beef with fat trimmed off. Low-sodium, lean deli meat. Dairy Low-fat (1%) or fat-free (skim) milk. Fat-free, low-fat, or reduced-fat cheeses. Nonfat, low-sodium ricotta or cottage cheese. Low-fat or nonfat yogurt. Low-fat, low-sodium cheese. Fats and oils Soft margarine without trans fats. Vegetable oil. Low-fat, reduced-fat, or light mayonnaise and salad dressings (reduced-sodium). Canola, safflower, olive, soybean, and sunflower oils. Avocado. Seasoning and other foods Herbs. Spices. Seasoning mixes without salt. Unsalted popcorn and pretzels. Fat-free sweets. What foods are not recommended? The items listed may not be a complete list. Talk with your dietitian about what dietary choices are best for you. Grains Baked goods made with fat, such as croissants, muffins, or some breads. Dry pasta or rice meal packs. Vegetables Creamed or fried vegetables. Vegetables in a cheese sauce. Regular canned vegetables (not low-sodium or reduced-sodium). Regular canned tomato sauce and paste (not low-sodium or reduced-sodium). Regular tomato and vegetable juice (not low-sodium or reduced-sodium). Pickles. Olives. Fruits Canned fruit in a light or heavy syrup. Fried fruit. Fruit in cream or butter sauce. Meat and other protein foods Fatty cuts of meat. Ribs. Fried meat. Bacon. Sausage. Bologna and other processed lunch meats. Salami. Fatback. Hotdogs. Bratwurst. Salted nuts and seeds. Canned beans with added salt. Canned or smoked fish. Whole eggs or egg yolks. Chicken or turkey with skin. Dairy Whole or 2% milk, cream, and half-and-half. Whole or full-fat cream cheese. Whole-fat or sweetened yogurt. Full-fat cheese. Nondairy creamers. Whipped toppings.  Processed cheese and cheese spreads. Fats and oils Butter. Stick margarine. Lard. Shortening. Ghee. Bacon fat. Tropical oils, such as coconut, palm kernel, or palm oil. Seasoning and other foods Salted popcorn and pretzels. Onion salt, garlic salt, seasoned salt, table salt, and sea salt. Worcestershire sauce. Tartar sauce. Barbecue sauce. Teriyaki sauce. Soy sauce, including reduced-sodium. Steak sauce. Canned and packaged gravies. Fish sauce. Oyster sauce. Cocktail sauce. Horseradish that you find on the shelf. Ketchup. Mustard. Meat flavorings and tenderizers. Bouillon cubes. Hot sauce and Tabasco sauce. Premade or packaged marinades. Premade or packaged taco seasonings. Relishes. Regular salad dressings. Where to find more information:  National Heart, Lung, and Blood Institute: www.nhlbi.nih.gov  American Heart Association: www.heart.org Summary  The DASH eating plan is a healthy eating plan that has been shown to reduce high blood pressure (hypertension). It may also reduce your risk for type 2 diabetes, heart disease, and stroke.  With the DASH eating plan, you should limit salt (sodium) intake to 2,300 mg a day. If you have hypertension, you may need to reduce your sodium intake to 1,500 mg a day.  When on the DASH eating plan, aim to eat more fresh fruits and vegetables, whole grains, lean proteins, low-fat dairy, and heart-healthy fats.  Work with your health care provider or diet and nutrition specialist (dietitian) to adjust your eating plan to your individual   calorie needs. This information is not intended to replace advice given to you by your health care provider. Make sure you discuss any questions you have with your health care provider. Document Released: 11/04/2011 Document Revised: 11/08/2016 Document Reviewed: 11/08/2016 Elsevier Interactive Patient Education  2017 Elsevier Inc.  Sonohysterogram A sonohysterogram is a procedure to examine the inside of the uterus. This  exam uses sound waves that are sent to a computer to make images of the lining of the uterus (endometrium). To get the best images, a germ-free, salt-water solution (sterile saline) is put into the uterus through the vagina. You may have this procedure if you have certain reproductive problems, such as abnormal bleeding, infertility, or miscarriage. This procedure can show what may be causing these problems. Possible causes include scarring or abnormal growths such as fibroids inside your uterus. It can also show if your uterus is an abnormal shape or if the lining of the uterus is too thin. Tell a health care provider about:  All medicines you are taking, including vitamins, herbs, eye drops, creams, and over-the-counter medicines.  Any allergies you have.  Any blood disorders you have.  Any surgeries you have had.  Any medical conditions you have.  Whether you are pregnant or may be pregnant.  The date of the first day of your last period.  Any signs of infection, such as fever, pain in your lower abdomen, or abnormal discharge from your vagina. What are the risks? Generally, this is a safe procedure. However, problems may occur, including:  Abdominal pain or cramping.  Light bleeding (spotting).  Increased vaginal discharge.  Infection.  What happens before the procedure?  Your health care provider may have you take an over-the-counter pain medicine.  You may be given medicine to stop any abnormal bleeding.  You may be given antibiotic medicine to help prevent infection.  You may be asked to take a pregnancy test. This is usually in the form of a urine test.  You may have a pelvic exam.  You will be asked to empty your bladder. What happens during the procedure?  You will lie down on the exam table with your feet in stirrups or with your knees bent and your feet flat on the table.  A slender, handheld device (transducer) will be lubricated and placed into your  vagina.  The transducer will be positioned to send sound waves to your uterus. The sound waves are sent to a computer and are turned into images, which your health care provider sees during the procedure.  The transducer will be removed from your vagina.  An instrument will be inserted to widen the opening of your vagina (speculum).  A swab with germ-killing solution (antiseptic) will be used to clean the opening to your uterus (cervix).  A long, thin tube (catheter) will be placed through your cervix into your uterus.  The speculum will be removed.  The transducer will be placed back into your vagina to take more images.  Your uterus will be filled with a germ-free, salt-water solution (sterile saline) through the catheter. You may feel some cramping.  A fluid that contains air bubbles may be sent through the catheter to make it easier to see the fallopian tubes.  The transducer and catheter will be removed. The procedure may vary among health care providers and hospitals. What happens after the procedure?  It is up to you to get the results of your procedure. Ask your health care provider, or the department that  is doing the procedure, when your results will be ready. Summary  A sonohysterogram is a procedure that creates images of the inside of the uterus.  The risks of this procedure are very low. Most women experience cramping and spotting after the procedure.  You may need to have a pelvic exam and take a pregnancy test before this procedure. This procedure will not be done if you are pregnant or have an infection. This information is not intended to replace advice given to you by your health care provider. Make sure you discuss any questions you have with your health care provider. Document Released: 04/01/2014 Document Revised: 10/11/2016 Document Reviewed: 10/11/2016 Elsevier Interactive Patient Education  2017 [employer]

## 2017-10-25 NOTE — Progress Notes (Signed)
Faith BladesStacy Sellers 05/29/1976 [id number]    History:    Presents for annual exam.  Monthly cycles lasting up to 2 weeks most lasting 10-12 days. Condoms. New partner. Hypertension, anemia  and hypercholesterolemia managed by primary care. 5/2017at primary care  ultrasound and endometrial biopsy showed simple hyperplasia no further treatment given. History of IBS. Normal Pap and mammogram history. 11/2016 normal TSH.  Past medical history, past surgical history, family history and social history were all reviewed and documented in the EPIC chart.Works at [employer]. 2 sons ages 5915, 8720 42 year old has had Gardasil.  ROS:  A ROS was performed and pertinent positives and negatives are included.  Exam:  Vitals:   10/25/17 1529  BP: 110/78  Weight: 238 lb (108 kg)  Height: 5\' 6"  (1.676 m)   Body mass index is 38.41 kg/m.   General appearance:  Normal Thyroid:  Symmetrical, normal in size, without palpable masses or nodularity. Respiratory  Auscultation:  Clear without wheezing or rhonchi Cardiovascular  Auscultation:  Regular rate, without rubs, murmurs or gallops  Edema/varicosities:  Not grossly evident Abdominal  Soft,nontender, without masses, guarding or rebound.  Liver/spleen:  No organomegaly noted  Hernia:  None appreciated  Skin  Inspection:  Grossly normal   Breasts: Examined lying and sitting.     Right: Without masses, retractions, discharge or axillary adenopathy.     Left: Without masses, retractions, discharge or axillary adenopathy. Gentitourinary   Inguinal/mons:  Normal without inguinal adenopathy  External genitalia:  Normal  BUS/Urethra/Skene's glands:  Normal  Vagina:  Normal  Cervix:  Normal  Uterus:   normal in size, shape and contour.  Midline and mobile  Adnexa/parametria:     Rt: Without masses or tenderness.   Lt: Without masses or tenderness.  Anus and perineum: Normal  Digital rectal exam: Normal sphincter tone without palpated masses or  tenderness  Assessment/Plan:  41 y.o. DBF G4 P2  for annual exam.     Menorrhagia/DUB /(03/2016 simple hyperplasia) Hypertension/hypercholesterolemia/anemia-primary care manages labs and meds IBS Obesity STD screen  Plan: Sonohysterogram with Dr. Audie BoxFontaine after next cycle, continue condoms. Reviewed possible options of Mirena IUD if simple hyperplasia is still present. SBE's, continue annual screening mammogram, calcium rich diet, vitamin D 2000 daily encouraged. Reviewed importance of increasing exercise and decreasing calories/carbs for weight loss. GC/Chlamydia, HIV, hep B, C, RPR. Pap normal 2017, new screening guidelines reviewed.    Harrington Challengerancy J Angela Platner [facility] HosWHNP, 4:22 PM 10/25/2017

## 2017-10-26 LAB — HEPATITIS C ANTIBODY
HEP C AB: NONREACTIVE
SIGNAL TO CUT-OFF: 0.03 (ref <1.00)

## 2017-10-26 LAB — C. TRACHOMATIS/N. GONORRHOEAE RNA
C. TRACHOMATIS RNA, TMA: NOT DETECTED
N. gonorrhoeae RNA, TMA: NOT DETECTED

## 2017-10-26 LAB — RPR: RPR Ser Ql: NONREACTIVE

## 2017-10-26 LAB — HIV ANTIBODY (ROUTINE TESTING W REFLEX): HIV 1&2 Ab, 4th Generation: NONREACTIVE

## 2017-10-26 LAB — HEPATITIS B SURFACE ANTIGEN: HEP B S AG: NONREACTIVE

## 2017-10-27 ENCOUNTER — Encounter: Payer: Self-pay | Admitting: Women's Health

## 2017-10-27 ENCOUNTER — Telehealth: Payer: Self-pay | Admitting: *Deleted

## 2017-10-27 NOTE — Telephone Encounter (Signed)
Patient informed with negtive STD screens results on 10/27/17.

## 2017-12-02 ENCOUNTER — Other Ambulatory Visit: Payer: Self-pay | Admitting: Internal Medicine

## 2017-12-02 DIAGNOSIS — D508 Other iron deficiency anemias: Secondary | ICD-10-CM

## 2017-12-09 ENCOUNTER — Encounter: Payer: Self-pay | Admitting: Women's Health

## 2017-12-09 ENCOUNTER — Other Ambulatory Visit: Payer: Self-pay | Admitting: Women's Health

## 2017-12-09 DIAGNOSIS — F410 Panic disorder [episodic paroxysmal anxiety] without agoraphobia: Secondary | ICD-10-CM | POA: Diagnosis not present

## 2017-12-09 DIAGNOSIS — N920 Excessive and frequent menstruation with regular cycle: Secondary | ICD-10-CM

## 2017-12-09 DIAGNOSIS — F4321 Adjustment disorder with depressed mood: Secondary | ICD-10-CM | POA: Diagnosis not present

## 2017-12-09 NOTE — Progress Notes (Signed)
sono

## 2017-12-15 ENCOUNTER — Ambulatory Visit: Payer: BLUE CROSS/BLUE SHIELD | Admitting: Gynecology

## 2018-01-23 ENCOUNTER — Other Ambulatory Visit: Payer: Self-pay | Admitting: Internal Medicine

## 2018-02-01 ENCOUNTER — Other Ambulatory Visit: Payer: Self-pay | Admitting: Women's Health

## 2018-02-07 ENCOUNTER — Other Ambulatory Visit: Payer: Self-pay | Admitting: Women's Health

## 2018-02-07 ENCOUNTER — Other Ambulatory Visit: Payer: Self-pay | Admitting: Internal Medicine

## 2018-02-07 MED ORDER — IBUPROFEN 800 MG PO TABS: 800.0000 mg | ORAL_TABLET | Freq: Three times a day (TID) | ORAL | 1 refills | Status: DC | PRN

## 2018-02-08 ENCOUNTER — Other Ambulatory Visit: Payer: Self-pay | Admitting: Internal Medicine

## 2018-02-08 NOTE — Telephone Encounter (Signed)
Appointment schedule at first available on 02/20/2018.

## 2018-02-08 NOTE — Telephone Encounter (Signed)
Pt needs an appt for refills. LOV 06/15/2017, pt was to follow up in Sept 2018.

## 2018-02-09 LAB — HM PAP SMEAR

## 2018-02-09 MED ORDER — ZOLPIDEM TARTRATE 10 MG PO TABS: 10.0000 mg | ORAL_TABLET | Freq: Every evening | ORAL | 1 refills | Status: DC | PRN

## 2018-02-16 LAB — HM MAMMOGRAPHY: HM Mammogram: NORMAL (ref 0–4)

## 2018-02-20 ENCOUNTER — Ambulatory Visit: Payer: BLUE CROSS/BLUE SHIELD | Admitting: Internal Medicine

## 2018-02-20 DIAGNOSIS — Z0289 Encounter for other administrative examinations: Secondary | ICD-10-CM

## 2018-02-23 DIAGNOSIS — Z1231 Encounter for screening mammogram for malignant neoplasm of breast: Secondary | ICD-10-CM | POA: Diagnosis not present

## 2018-03-07 IMAGING — CT CT ABD-PELV W/ CM
2 of 5 series · 16 of 46 positions shown, 18 images · IV contrast (iopamidol)
Comparison: Prior CT from 10/25/2011.

CLINICAL DATA: Initial evaluation for acute left lower quadrant
abdominal pain.

EXAM:
CT ABDOMEN AND PELVIS WITH CONTRAST
TECHNIQUE: Multidetector CT imaging of the abdomen and pelvis was performed
using the standard protocol following bolus administration of
intravenous contrast.
CONTRAST:  100mL [id number] IOPAMIDOL ([id number]) INJECTION 61%

[Series 2: axial st · axial · 0.79mm/px · z∈[+523,+958]mm · 13 of 99 slices shown, 15 images]
[im 6/99  soft-tissue]
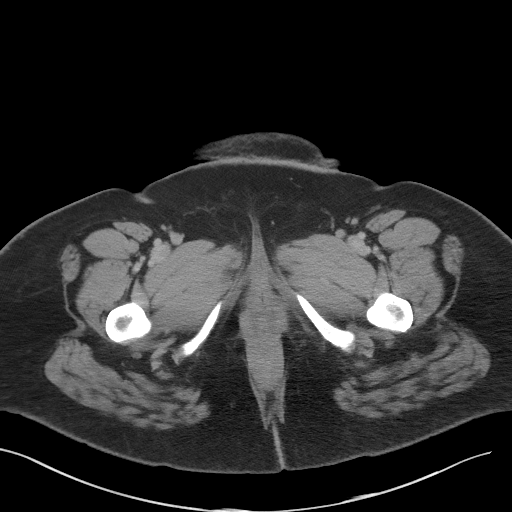
[im 6/99  bone]
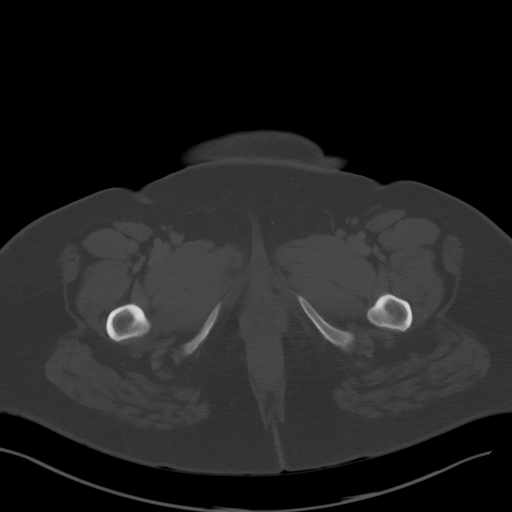
[im 16/99  soft-tissue]
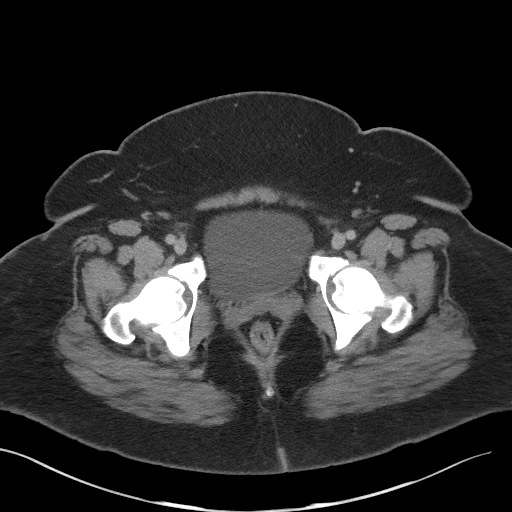
[im 21/99  soft-tissue]
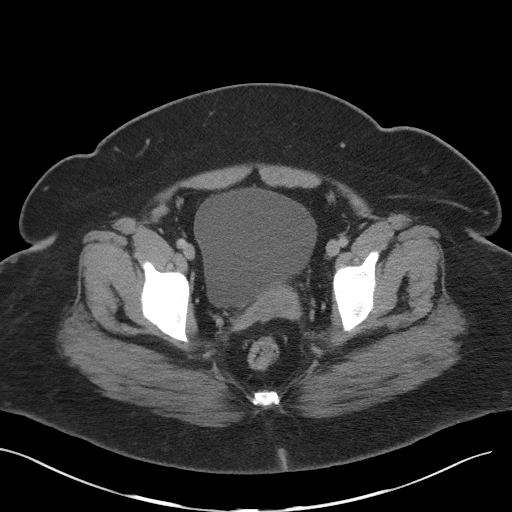
[im 26/99  soft-tissue]
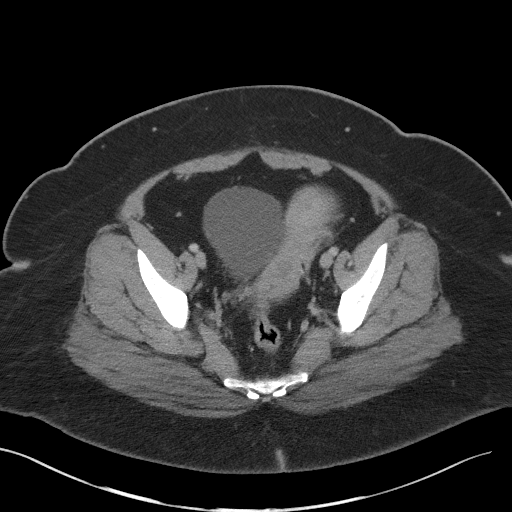
[im 37/99  soft-tissue]
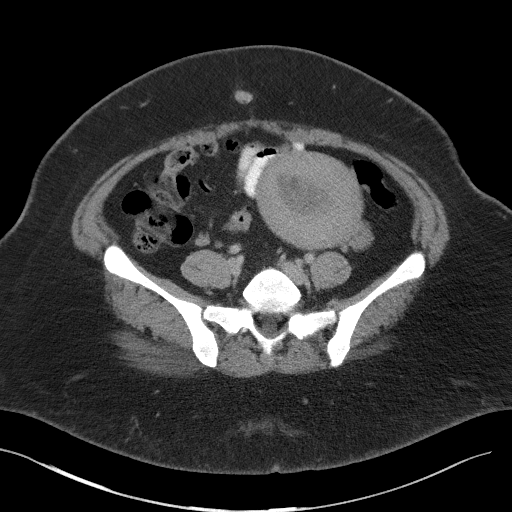
[im 42/99  soft-tissue]
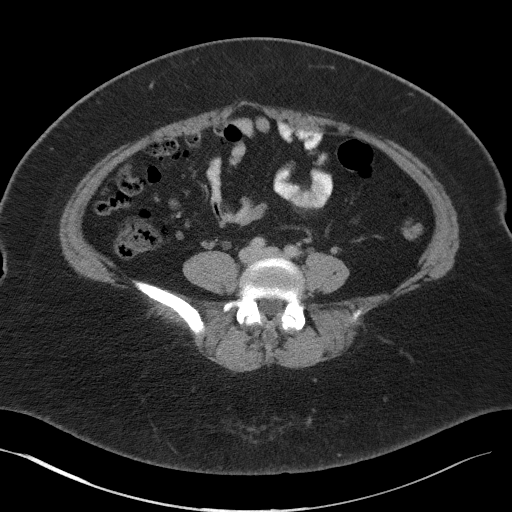
[im 52/99  soft-tissue]
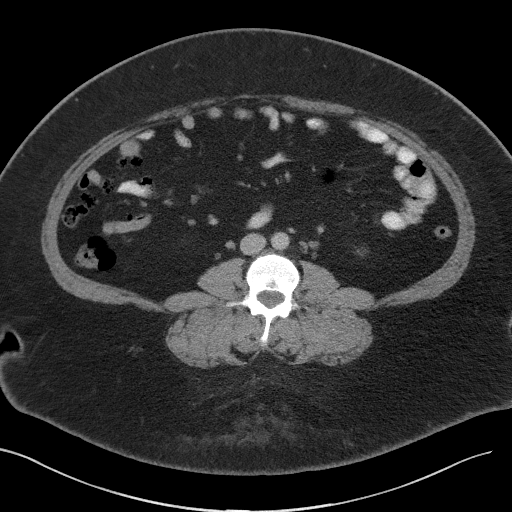
[im 57/99  soft-tissue]
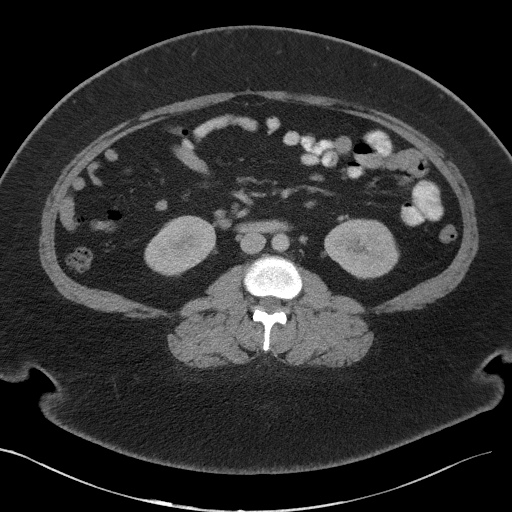
[im 62/99  soft-tissue]
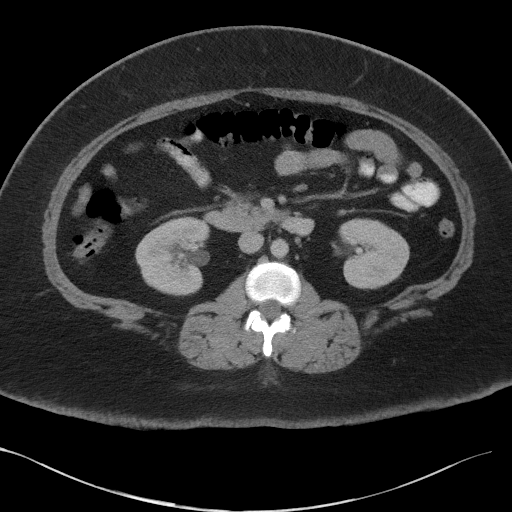
[im 62/99  bone]
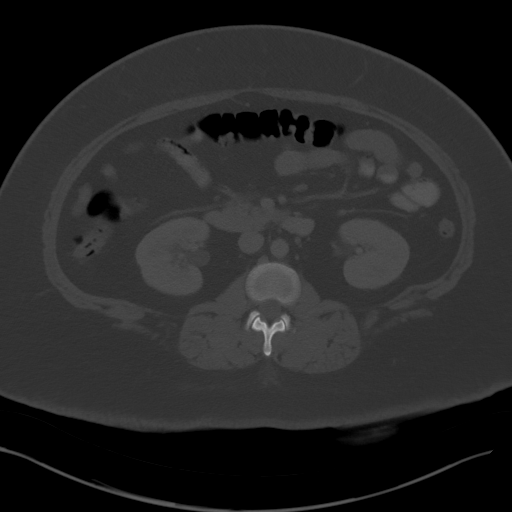
[im 73/99  soft-tissue]
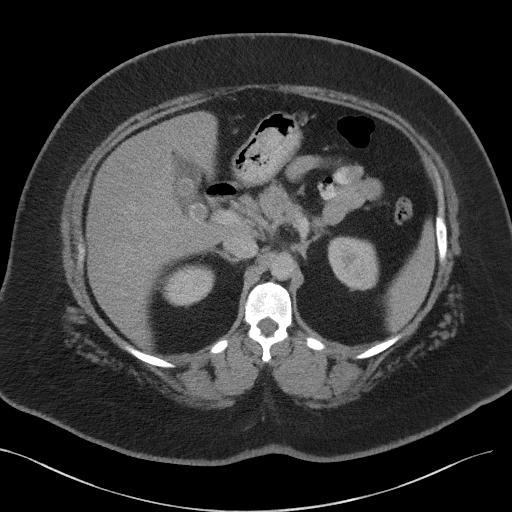
[im 78/99  soft-tissue]
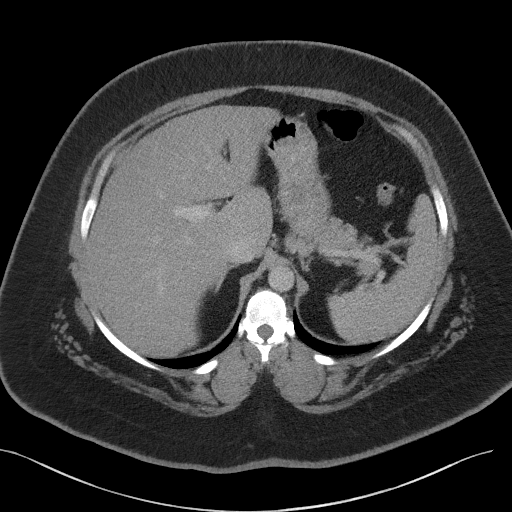
[im 83/99  soft-tissue]
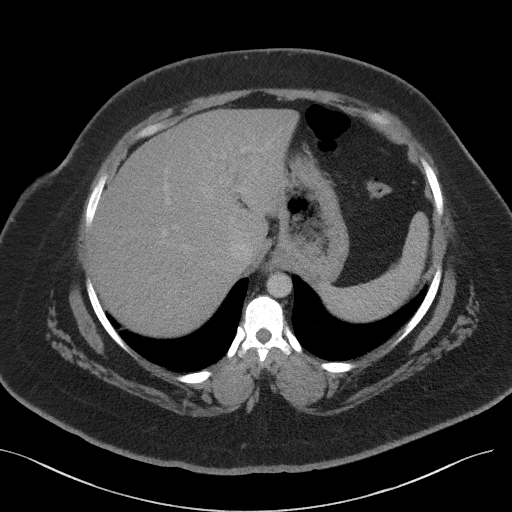
[im 93/99  soft-tissue]
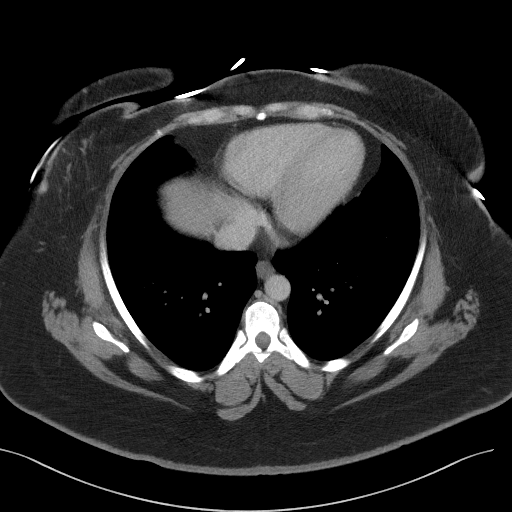

[Series 5: coronal st · coronal · 0.96mm/px · 3 of 102 slices shown]
[im 34/102  soft-tissue]
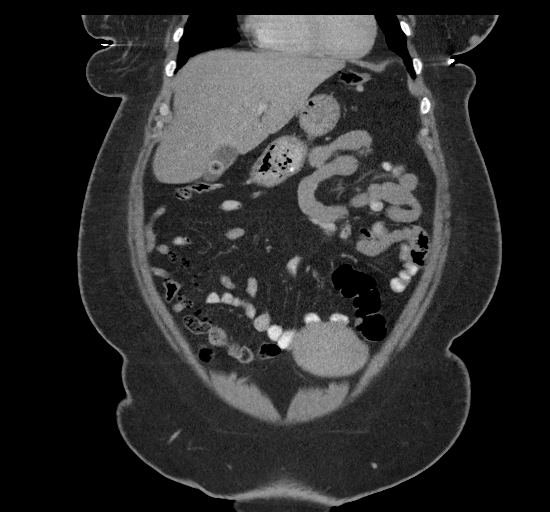
[im 45/102  soft-tissue]
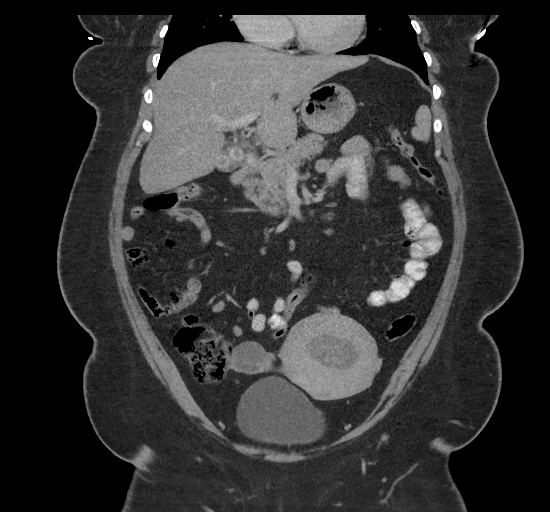
[im 57/102  soft-tissue]
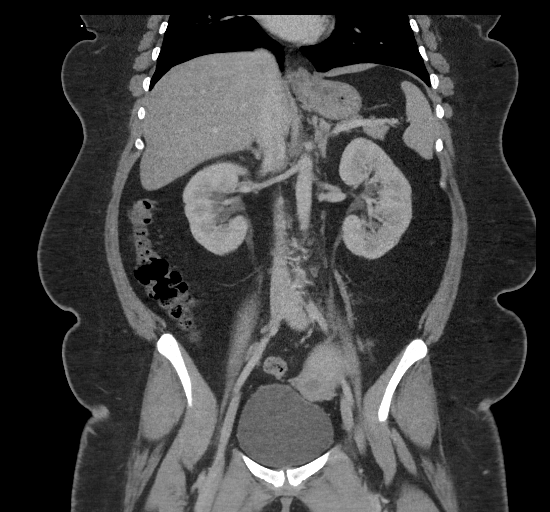

[16 of 46 positions shown; findings below may reference images not displayed]

FINDINGS: Lower chest: Visualized lung bases are clear.

Hepatobiliary: Liver within normal limits. Multiple stones present
within the gallbladder lumen. No CT findings to suggest acute
cholecystitis. No biliary dilatation.

Pancreas: Pancreas within normal limits.

Spleen: Spleen within normal limits.

Adrenals/Urinary Tract: Adrenal glands are normal. Kidneys equal in
size with symmetric enhancement. No nephrolithiasis, hydronephrosis,
or focal enhancing renal mass. Ureters of normal caliber. Bladder
within normal limits.

Stomach/Bowel: Small hiatal hernia noted. Stomach otherwise
unremarkable. No evidence for bowel obstruction. Multiple small
diverticula seen involving the ileum. No significant inflammatory
changes to suggest acute diverticulitis. Appendix is normal. No
abnormal wall thickening, mucosal enhancement, or inflammatory fat
stranding seen about the bowels.

Vascular/Lymphatic: Normal intravascular enhancement seen throughout
the intra-abdominal aorta and its branch vessels. No adenopathy.

Reproductive: Uterus is somewhat enlarged projecting into the left
lower quadrant of the abdomen. Hypodensity within the endometrial
canal. Correlation with menstrual cycle recommended. 2.6 cm
physiologic cyst present within the right ovary. Left ovary
unremarkable.

Other: No free air or fluid.

Musculoskeletal: No acute osseus abnormality. No worrisome lytic or
blastic osseous lesions.
IMPRESSION: 1. No CT evidence for acute intra-abdominal or pelvic process.
2. Small bowel diverticulosis involving the ileum without findings
to suggest acute diverticulitis.
3. Cholelithiasis.
4. Somewhat enlarged uterus projecting into the left lower quadrant
with hypodensity within the endometrial canal. Correlation with
menstrual cycle recommended. This could be further assessed with
dedicated pelvic ultrasound as desired.

## 2018-06-05 DIAGNOSIS — F3342 Major depressive disorder, recurrent, in full remission: Secondary | ICD-10-CM | POA: Diagnosis not present

## 2018-06-05 DIAGNOSIS — F410 Panic disorder [episodic paroxysmal anxiety] without agoraphobia: Secondary | ICD-10-CM | POA: Diagnosis not present

## 2018-08-01 ENCOUNTER — Other Ambulatory Visit: Payer: Self-pay | Admitting: Women's Health

## 2018-08-11 ENCOUNTER — Emergency Department (HOSPITAL_BASED_OUTPATIENT_CLINIC_OR_DEPARTMENT_OTHER)
Admission: EM | Admit: 2018-08-11 | Discharge: 2018-08-11 | Disposition: A | Discharge status: Home / Self Care | Payer: BLUE CROSS/BLUE SHIELD | Attending: Emergency Medicine | Admitting: Emergency Medicine

## 2018-08-11 ENCOUNTER — Encounter (HOSPITAL_BASED_OUTPATIENT_CLINIC_OR_DEPARTMENT_OTHER): Payer: Self-pay | Admitting: Adult Health

## 2018-08-11 ENCOUNTER — Other Ambulatory Visit: Payer: Self-pay

## 2018-08-11 DIAGNOSIS — I10 Essential (primary) hypertension: Secondary | ICD-10-CM | POA: Diagnosis not present

## 2018-08-11 DIAGNOSIS — L02416 Cutaneous abscess of left lower limb: Secondary | ICD-10-CM | POA: Diagnosis not present

## 2018-08-11 DIAGNOSIS — R600 Localized edema: Secondary | ICD-10-CM | POA: Diagnosis not present

## 2018-08-11 DIAGNOSIS — L0291 Cutaneous abscess, unspecified: Secondary | ICD-10-CM

## 2018-08-11 DIAGNOSIS — Z79899 Other long term (current) drug therapy: Secondary | ICD-10-CM | POA: Insufficient documentation

## 2018-08-11 HISTORY — DX: Diverticulitis of intestine, part unspecified, without perforation or abscess without bleeding: K57.92

## 2018-08-11 MED ORDER — FLUCONAZOLE 150 MG PO TABS: 150.0000 mg | ORAL_TABLET | ORAL | 0 refills | Status: AC

## 2018-08-11 MED ORDER — FLUCONAZOLE 150 MG PO TABS: 150.0000 mg | ORAL_TABLET | ORAL | 0 refills | Status: DC

## 2018-08-11 MED ORDER — LIDOCAINE-EPINEPHRINE (PF) 2 %-1:200000 IJ SOLN
10.0000 mL | Freq: Once | INTRAMUSCULAR | Status: AC
  Administered 2018-08-11: 10 mL
  Filled 2018-08-11 (×2): qty 10

## 2018-08-11 MED ORDER — SULFAMETHOXAZOLE-TRIMETHOPRIM 800-160 MG PO TABS
1.0000 | ORAL_TABLET | Freq: Once | ORAL | Status: AC
  Administered 2018-08-11: 1 via ORAL
  Filled 2018-08-11: qty 1

## 2018-08-11 MED ORDER — SULFAMETHOXAZOLE-TRIMETHOPRIM 800-160 MG PO TABS: 1.0000 | ORAL_TABLET | Freq: Two times a day (BID) | ORAL | 0 refills | Status: AC

## 2018-08-11 NOTE — ED Notes (Signed)
Pt. Has an abscess on the inner aspect of the L knee.  Noted redness with edema.

## 2018-08-11 NOTE — ED Triage Notes (Signed)
Presents with indurated 4x4 cm warm area to medial left leg. She reprots that a small bump has been there for a year, but it became worse over the last week. The area has been draining when it comes to a head. She endorses pain. denies fevers.

## 2018-08-11 NOTE — ED Notes (Signed)
Dressing placed on Pt. L leg at the abscess site where the abscess was opened.  No bleeding noted.

## 2018-08-11 NOTE — Discharge Instructions (Signed)
Take antibiotics as directed.  Use warm soaks 2-3 times a day to help promote healing, keep the area covered as it may continue to drain over the next few days.  If you feel that it is not improving or you see any worsening redness, warmth, swelling, pain or increasing amounts of drainage or any fevers or chills please return to the emergency department the next 1 to 2 days otherwise follow-up with your primary care doctor.

## 2018-08-11 NOTE — ED Provider Notes (Signed)
[facility] EMERGENCY DEPARTMENT Provider Note   CSN: [id number] Arrival date & time: 08/11/18  1835     History   Chief Complaint Chief Complaint  Patient presents with  . Abscess    HPI Faith Sellers is a 42 y.o. female.  Faith Sellers is a 42 y.o. Female with a history of hypertension, hyperlipidemia, GERD, diverticulitis and anxiety, who presents to the emergency department for evaluation of abscess on the inside of her left knee.  Patient reports she has had a really small bump in this area that looks like it has a head on it for about a year, but started messing with it about a week ago and then it became red, inflamed, warm and increased in size.  She has not been able to express any drainage from the area, but reports it has become increasingly painful.  She is able to bend and extend her knee without difficulty.  No redness or swelling to the knee itself.  No issues walking she denies any fevers or chills, no nausea or vomiting.  No history of prior abscesses, no history of diabetes.  She has not taken anything to treat symptoms prior to arrival, no other aggravating or alleviating factors.     Past Medical History:  Diagnosis Date  . Anxiety   . Diverticulitis   . Gastric ulcer   . GERD (gastroesophageal reflux disease)   . Hypercholesteremia   . Hypertension     Patient Active Problem List   Diagnosis Date Noted  . Tinea cruris 06/15/2017  . Hypokalemia 12/16/2015  . Iron deficiency anemia 09/11/2015  . Routine general medical examination at a health care facility 07/19/2013  . Obesity, Class II, BMI 35-39.9, with comorbidity 05/24/2013  . Constipation, chronic 05/24/2013  . Depression with anxiety 12/01/2012  . Dyshidrotic eczema 12/01/2012  . Other abnormal glucose 03/03/2012  . Pure hypercholesterolemia 03/03/2012  . GERD (gastroesophageal reflux disease) 11/15/2011  . Essential hypertension, benign 11/15/2011  . Insomnia 11/15/2011    History  reviewed. No pertinent surgical history.   OB History    Gravida  4   Para  2   Term  2   Preterm      AB  2   Living  2     SAB      TAB      Ectopic      Multiple      Live Births  2            Home Medications    Prior to Admission medications   Medication Sig Start Date End Date Taking? Authorizing Provider  [name] (FERRALET 90) 90-1 MG TABS TAKE 1 TABLET BY MOUTH ONCE DAILY 12/03/17  Yes Etta Grandchild, MD  linaclotide [facility]) 145 MCG CAPS capsule Take 1 capsule (145 mcg total) by mouth daily. 12/23/16  Yes Etta Grandchild, MD  ALPRAZolam Prudy Feeler) 1 MG tablet Take 1 mg by mouth at bedtime as needed for anxiety.    [provider]  cholecalciferol (VITAMIN D) 1000 units tablet Take 1,000 Units by mouth daily.    [provider]  diazepam (VALIUM) 5 MG tablet Take 1 tablet (5 mg total) by mouth every 6 (six) hours as needed for anxiety. 10/25/17   Harrington Challenger, NP  hydrochlorothiazide (MICROZIDE) 12.5 MG capsule Take 1 capsule (12.5 mg total) by mouth daily. 06/26/17   Etta Grandchild, MD  ibuprofen (ADVIL,MOTRIN) 800 MG tablet TAKE 1 TABLET(800 MG)  BY MOUTH EVERY 8 HOURS AS NEEDED 08/01/18   Harrington Challenger, NP  sulfamethoxazole-trimethoprim (BACTRIM DS,SEPTRA DS) 800-160 MG tablet Take 1 tablet by mouth 2 (two) times daily for 7 days. 08/11/18 08/18/18  Dartha Lodge, PA-C  zolpidem (AMBIEN) 10 MG tablet Take 1 tablet (10 mg total) by mouth at bedtime as needed for sleep. 02/09/18   Etta Grandchild, MD    Family History Family History  Adopted: Yes  Problem Relation Age of Onset  . Hypertension Sister   . COPD Neg Hx   . Cancer Neg Hx   . Early death Neg Hx   . Heart disease Neg Hx   . Hyperlipidemia Neg Hx   . Kidney disease Neg Hx   . Stroke Neg Hx     Social History Social History   Tobacco Use  . Smoking status: Never Smoker  . Smokeless tobacco: Never Used  Substance Use Topics  . Alcohol use: Yes     Alcohol/week: 0.0 standard drinks    Comment: occasional  . Drug use: No     Allergies   Patient has no known allergies.   Review of Systems Review of Systems  Constitutional: Negative for chills and fever.  Musculoskeletal: Negative for arthralgias and joint swelling.  Skin: Positive for color change.       Abscess     Physical Exam Updated Vital Signs BP (!) 163/97 (BP Location: Right Arm)   Pulse 69   Temp 98.3 F (36.8 C) (Oral)   Resp 18   Ht 5\' 6"  (1.676 m)   Wt 98.9 kg   LMP 08/02/2018 (Approximate)   SpO2 98%   BMI 35.19 kg/m   Physical Exam  Constitutional: She appears well-developed and well-nourished. No distress.  HENT:  Head: Normocephalic and atraumatic.  Eyes: Right eye exhibits no discharge. Left eye exhibits no discharge.  Pulmonary/Chest: Effort normal. No respiratory distress.  Musculoskeletal:  2 x 2 centimeter area of fluctuance over the medial lower leg just below the knee, with 2 cm of surrounding erythema, warm to the touch, no expressible drainage.  No tenderness, swelling or erythema over the knee.  There is normal range of motion of the knee, 2+ DP and PT pulses, sensation intact  Neurological: She is alert. Coordination normal.  Skin: She is not diaphoretic.  Psychiatric: She has a normal mood and affect. Her behavior is normal.  Nursing note and vitals reviewed.      ED Treatments / Results  Labs (all labs ordered are listed, but only abnormal results are displayed) Labs Reviewed - No data to display  EKG None  Radiology No results found.  Procedures  EMERGENCY DEPARTMENT US SOFT TISSUE INTERPRETATION "Study: Limited Soft Tissue Ultrasound"  INDICATIONS: Soft tissue infection Multiple views of the body part were obtained in real-time with a multi-frequency linear probe  PERFORMED BY: Myself IMAGES ARCHIVED?: Yes SIDE:Left BODY PART:Lower extremity INTERPRETATION:  Abcess present     .Marland KitchenIncision and  Drainage Date/Time: 08/11/2018 7:53 PM Performed by: Dartha Lodge, PA-C Authorized by: Dartha Lodge, PA-C   Consent:    Consent obtained:  Verbal   Consent given by:  Patient   Risks discussed:  Bleeding, damage to other organs, infection, incomplete drainage and pain   Alternatives discussed:  No treatment Location:    Type:  Abscess   Size:  2 cm   Location:  Lower extremity   Lower extremity location:  Knee   Knee location:  L  knee Pre-procedure details:    Skin preparation:  Betadine Anesthesia (see MAR for exact dosages):    Anesthesia method:  Local infiltration   Local anesthetic:  Lidocaine 2% WITH epi Procedure type:    Complexity:  Simple Procedure details:    Incision types:  Single straight   Incision depth:  Dermal   Scalpel blade:  11   Wound management:  Probed and deloculated and irrigated with saline   Drainage:  Purulent and bloody   Drainage amount:  Moderate   Wound treatment:  Wound left open Post-procedure details:    Patient tolerance of procedure:  Tolerated well, no immediate complications   (including critical care time)  Medications Ordered in ED Medications  lidocaine-EPINEPHrine (XYLOCAINE W/EPI) 2 %-1:200000 (PF) injection 10 mL (10 mLs Infiltration Given 08/11/18 2009)  sulfamethoxazole-trimethoprim (BACTRIM DS,SEPTRA DS) 800-160 MG per tablet 1 tablet (1 tablet Oral Given 08/11/18 2006)     Initial Impression / Assessment and Plan / ED Course  I have reviewed the triage vital signs and the nursing notes.  Pertinent labs & imaging results that were available during my care of the patient were reviewed by me and considered in my medical decision making (see chart for details).  Patient with skin abscess amenable to incision and drainage.  Test to the left medial knee, confirmed to be very superficial on ultrasound.  Abscess was not large enough to warrant packing or drain,  wound recheck in 2 days. Encouraged home warm soaks and flushing.   Been surrounding cellulitis in proximity to joint will treat with antibiotics, first dose of Bactrim given in the ED.  Return precautions discussed.  Patient expresses understanding and is in agreement with plan.  Discharged home.  Final Clinical Impressions(s) / ED Diagnoses   Final diagnoses:  Abscess    ED Discharge Orders         Ordered    sulfamethoxazole-trimethoprim (BACTRIM DS,SEPTRA DS) 800-160 MG tablet  2 times daily     08/11/18 1952           Legrand RamsFord, Cerrone Debold N, PA-C 08/11/18 2048    Maia PlanLong, Joshua G, MD 08/12/18 1021

## 2018-10-08 ENCOUNTER — Other Ambulatory Visit: Payer: Self-pay | Admitting: Women's Health

## 2018-10-09 NOTE — Telephone Encounter (Signed)
Ok for refill but please call and review not to take daily. Hard on the kidneys

## 2018-11-25 DIAGNOSIS — F410 Panic disorder [episodic paroxysmal anxiety] without agoraphobia: Secondary | ICD-10-CM | POA: Diagnosis not present

## 2018-12-21 ENCOUNTER — Telehealth: Payer: Self-pay | Admitting: Internal Medicine

## 2018-12-21 DIAGNOSIS — D508 Other iron deficiency anemias: Secondary | ICD-10-CM

## 2018-12-28 MED ORDER — FERRALET 90 90-1 MG PO TABS: 1.0000 | ORAL_TABLET | Freq: Every day | ORAL | 0 refills | Status: DC

## 2018-12-28 NOTE — Telephone Encounter (Signed)
30 day supply has been sent  

## 2018-12-28 NOTE — Telephone Encounter (Signed)
Pt states she needs a refill on [name] (FERRALET 90) 90-1 MG TABS , she is aware it was denied because she needed an appt. She has scheduled a cpe for 2/19. She would like to know could she have enough to get her by until the appt. Please advise.

## 2019-01-17 ENCOUNTER — Ambulatory Visit (INDEPENDENT_AMBULATORY_CARE_PROVIDER_SITE_OTHER): Payer: BLUE CROSS/BLUE SHIELD | Admitting: Internal Medicine

## 2019-01-17 ENCOUNTER — Other Ambulatory Visit (INDEPENDENT_AMBULATORY_CARE_PROVIDER_SITE_OTHER): Payer: BLUE CROSS/BLUE SHIELD

## 2019-01-17 ENCOUNTER — Encounter: Payer: Self-pay | Admitting: Internal Medicine

## 2019-01-17 VITALS — BP 154/96 | HR 68 | Temp 98.6°F | Resp 16 | Ht 66.0 in | Wt 215.0 lb

## 2019-01-17 DIAGNOSIS — D500 Iron deficiency anemia secondary to blood loss (chronic): Secondary | ICD-10-CM

## 2019-01-17 DIAGNOSIS — Z0000 Encounter for general adult medical examination without abnormal findings: Secondary | ICD-10-CM | POA: Diagnosis not present

## 2019-01-17 DIAGNOSIS — E7800 Pure hypercholesterolemia, unspecified: Secondary | ICD-10-CM

## 2019-01-17 DIAGNOSIS — I10 Essential (primary) hypertension: Secondary | ICD-10-CM

## 2019-01-17 DIAGNOSIS — E876 Hypokalemia: Secondary | ICD-10-CM | POA: Diagnosis not present

## 2019-01-17 DIAGNOSIS — D539 Nutritional anemia, unspecified: Secondary | ICD-10-CM

## 2019-01-17 DIAGNOSIS — R7309 Other abnormal glucose: Secondary | ICD-10-CM

## 2019-01-17 DIAGNOSIS — E559 Vitamin D deficiency, unspecified: Secondary | ICD-10-CM

## 2019-01-17 LAB — LIPID PANEL
CHOLESTEROL: 198 mg/dL (ref 0–200)
HDL: 45.5 mg/dL (ref >39.00)
LDL CALC: 124 mg/dL — AB (ref 0–99)
NonHDL: 152.99
TRIGLYCERIDES: 143 mg/dL (ref 0.0–149.0)
Total CHOL/HDL Ratio: 4
VLDL: 28.6 mg/dL (ref 0.0–40.0)

## 2019-01-17 LAB — MAGNESIUM: MAGNESIUM: 1.8 mg/dL (ref 1.5–2.5)

## 2019-01-17 LAB — CBC WITH DIFFERENTIAL/PLATELET
BASOS PCT: 0.3 % (ref 0.0–3.0)
Basophils Absolute: 0 10*3/uL (ref 0.0–0.1)
EOS ABS: 0.2 10*3/uL (ref 0.0–0.7)
Eosinophils Relative: 2 % (ref 0.0–5.0)
HEMATOCRIT: 36.3 % (ref 36.0–46.0)
HEMOGLOBIN: 11.6 g/dL — AB (ref 12.0–15.0)
LYMPHS PCT: 32 % (ref 12.0–46.0)
Lymphs Abs: 3.2 10*3/uL (ref 0.7–4.0)
MCHC: 31.9 g/dL (ref 30.0–36.0)
MCV: 75.6 fl — ABNORMAL LOW (ref 78.0–100.0)
Monocytes Absolute: 0.7 10*3/uL (ref 0.1–1.0)
Monocytes Relative: 7 % (ref 3.0–12.0)
Neutro Abs: 5.9 10*3/uL (ref 1.4–7.7)
Neutrophils Relative %: 58.7 % (ref 43.0–77.0)
Platelets: 261 10*3/uL (ref 150.0–400.0)
RBC: 4.8 Mil/uL (ref 3.87–5.11)
RDW: 17.8 % — AB (ref 11.5–15.5)
WBC: 10 10*3/uL (ref 4.0–10.5)

## 2019-01-17 LAB — COMPREHENSIVE METABOLIC PANEL
ALBUMIN: 3.9 g/dL (ref 3.5–5.2)
ALT: 8 U/L (ref 0–35)
AST: 10 U/L (ref 0–37)
Alkaline Phosphatase: 75 U/L (ref 39–117)
BUN: 12 mg/dL (ref 6–23)
CALCIUM: 9.5 mg/dL (ref 8.4–10.5)
CHLORIDE: 103 meq/L (ref 96–112)
CO2: 28 mEq/L (ref 19–32)
CREATININE: 0.6 mg/dL (ref 0.40–1.20)
GFR: 131.84 mL/min (ref >60.00)
Glucose, Bld: 87 mg/dL (ref 70–99)
POTASSIUM: 3.7 meq/L (ref 3.5–5.1)
Sodium: 138 mEq/L (ref 135–145)
Total Bilirubin: 0.6 mg/dL (ref 0.2–1.2)
Total Protein: 8 g/dL (ref 6.0–8.3)

## 2019-01-17 LAB — TSH: TSH: 1.15 u[IU]/mL (ref 0.35–4.50)

## 2019-01-17 LAB — HCG, QUANTITATIVE, PREGNANCY

## 2019-01-17 LAB — IBC PANEL
Iron: 412 ug/dL — ABNORMAL HIGH (ref 42–145)
Saturation Ratios: 88.1 % — ABNORMAL HIGH (ref 20.0–50.0)
Transferrin: 334 mg/dL (ref 212.0–360.0)

## 2019-01-17 LAB — HEMOGLOBIN A1C: Hgb A1c MFr Bld: 5.4 % (ref 4.6–6.5)

## 2019-01-17 LAB — VITAMIN B12: VITAMIN B 12: 388 pg/mL (ref 211–911)

## 2019-01-17 LAB — VITAMIN D 25 HYDROXY (VIT D DEFICIENCY, FRACTURES)

## 2019-01-17 LAB — FERRITIN: Ferritin: 15.7 ng/mL (ref 10.0–291.0)

## 2019-01-17 LAB — FOLATE: FOLATE: 20.6 ng/mL (ref >5.9)

## 2019-01-17 MED ORDER — CHOLECALCIFEROL 1.25 MG (50000 UT) PO CAPS: 50000.0000 [IU] | ORAL_CAPSULE | ORAL | 0 refills | Status: DC

## 2019-01-17 MED ORDER — TRIAMTERENE-HCTZ 37.5-25 MG PO CAPS: 1.0000 | ORAL_CAPSULE | Freq: Every day | ORAL | 0 refills | Status: DC

## 2019-01-17 NOTE — Patient Instructions (Signed)

## 2019-01-17 NOTE — Progress Notes (Signed)
Subjective:  Patient ID: Faith BladesStacy Reiger, female    DOB: 03/01/1976  Age: 43 y.o. MRN: [id number]  CC: Annual Exam and Hypertension   HPI Faith Sellers presents for a CPX.  She is concerned that her blood pressure is not adequately well controlled.  She has not been taking hydrochlorothiazide.  She denies headache/blurred vision/chest pain/shortness of breath/edema/fatigue.  She has intentionally lost about 20 pounds over the last year.  Outpatient Medications Prior to Visit  Medication Sig Dispense Refill  . ALPRAZolam (XANAX) 1 MG tablet Take 1 mg by mouth at bedtime as needed for anxiety.    . cholecalciferol (VITAMIN D) 1000 units tablet Take 1,000 Units by mouth daily.    Marland Kitchen. [name] (FERRALET 90) 90-1 MG TABS Take 1 tablet by mouth daily. 30 each 0  . ibuprofen (ADVIL,MOTRIN) 800 MG tablet TAKE 1 TABLET(800 MG) BY MOUTH EVERY 8 HOURS AS NEEDED 30 tablet 1  . linaclotide (LINZESS) 145 MCG CAPS capsule Take 1 capsule (145 mcg total) by mouth daily. 90 capsule 1  . diazepam (VALIUM) 5 MG tablet Take 1 tablet (5 mg total) by mouth every 6 (six) hours as needed for anxiety. 10 tablet 0  . hydrochlorothiazide (MICROZIDE) 12.5 MG capsule Take 1 capsule (12.5 mg total) by mouth daily. (Patient not taking: Reported on 01/17/2019) 90 capsule 1  . zolpidem (AMBIEN) 10 MG tablet Take 1 tablet (10 mg total) by mouth at bedtime as needed for sleep. 30 tablet 1   No facility-administered medications prior to visit.     ROS Review of Systems  Constitutional: Negative for appetite change, diaphoresis, fatigue and unexpected weight change.  HENT: Negative.   Eyes: Negative for visual disturbance.  Respiratory: Negative for apnea, cough, chest tightness, shortness of breath and wheezing.   Cardiovascular: Negative for chest pain, palpitations and leg swelling.  Gastrointestinal: Negative for abdominal pain, constipation, diarrhea, nausea and vomiting.  Genitourinary: Negative.   Negative for difficulty urinating, dysuria and urgency.  Musculoskeletal: Negative.  Negative for arthralgias, back pain, myalgias and neck pain.  Skin: Negative for color change and pallor.  Neurological: Negative.  Negative for dizziness, weakness, light-headedness and headaches.  Hematological: Negative for adenopathy. Does not bruise/bleed easily.  Psychiatric/Behavioral: Negative.     Objective:  BP (!) 154/96 (BP Location: Left Arm, Patient Position: Sitting, Cuff Size: Large)   Pulse 68   Temp 98.6 F (37 C) (Oral)   Resp 16   Ht 5\' 6"  (1.676 m)   Wt 215 lb (97.5 kg)   LMP 12/13/2018 (Within Weeks) Comment: Cycles are irregular  SpO2 99%   BMI 34.70 kg/m   BP Readings from Last 3 Encounters:  01/17/19 (!) 154/96  08/11/18 (!) 163/97  10/25/17 110/78    Wt Readings from Last 3 Encounters:  01/17/19 215 lb (97.5 kg)  08/11/18 218 lb (98.9 kg)  10/25/17 238 lb (108 kg)    Physical Exam Vitals signs reviewed.  Constitutional:      Appearance: She is not ill-appearing or diaphoretic.  HENT:     Nose: Nose normal. No congestion or rhinorrhea.     Mouth/Throat:     Mouth: Mucous membranes are moist.     Pharynx: Oropharynx is clear. No oropharyngeal exudate or posterior oropharyngeal erythema.  Eyes:     General: No scleral icterus.    Conjunctiva/sclera: Conjunctivae normal.  Neck:     Musculoskeletal: Normal range of motion and neck supple. No muscular tenderness.  Cardiovascular:  Rate and Rhythm: Normal rate and regular rhythm.     Pulses: Normal pulses.     Heart sounds: No murmur. No gallop.   Pulmonary:     Effort: Pulmonary effort is normal. No respiratory distress.     Breath sounds: No stridor. No wheezing, rhonchi or rales.  Abdominal:     General: Abdomen is flat. Bowel sounds are normal. There is no distension.     Palpations: There is no mass.     Tenderness: There is no abdominal tenderness. There is no guarding.  Musculoskeletal: Normal  range of motion.        General: No swelling.     Right lower leg: No edema.     Left lower leg: No edema.  Lymphadenopathy:     Cervical: No cervical adenopathy.  Skin:    General: Skin is warm and dry.  Neurological:     General: No focal deficit present.     Mental Status: She is oriented to person, place, and time. Mental status is at baseline.     Lab Results  Component Value Date   WBC 10.0 01/17/2019   HGB 11.6 (L) 01/17/2019   HCT 36.3 01/17/2019   PLT 261.0 01/17/2019   GLUCOSE 87 01/17/2019   CHOL 198 01/17/2019   TRIG 143.0 01/17/2019   HDL 45.50 01/17/2019   LDLDIRECT 151.2 07/19/2013   LDLCALC 124 (H) 01/17/2019   ALT 8 01/17/2019   AST 10 01/17/2019   NA 138 01/17/2019   K 3.7 01/17/2019   CL 103 01/17/2019   CREATININE 0.60 01/17/2019   BUN 12 01/17/2019   CO2 28 01/17/2019   TSH 1.15 01/17/2019   INR 0.96 08/28/2011   HGBA1C 5.4 01/17/2019    No results found.  Assessment & Plan:   Jonaya was seen today for annual exam and hypertension.  Diagnoses and all orders for this visit:  Essential hypertension, benign- Her blood pressure is not adequately well controlled.  She has a history of hypokalemia so I have asked her to start the combination of a potassium sparing and a potassium wasting diuretic.  Will treat the vitamin D deficiency.  The rest of her labs are negative for secondary causes or endorgan damage. -     Comprehensive metabolic panel; Future -     TSH; Future -     Magnesium; Future -     VITAMIN D 25 Hydroxy (Vit-D Deficiency, Fractures); Future -     hCG, quantitative, pregnancy; Future -     triamterene-hydrochlorothiazide (DYAZIDE) 37.5-25 MG capsule; Take 1 each (1 capsule total) by mouth daily.  Iron deficiency anemia due to chronic blood loss- Her anemia has improved.  Her iron level is high so I asked her to stop taking the iron supplement. -     CBC with Differential/Platelet; Future -     IBC panel; Future -     Ferritin;  Future  Routine general medical examination at a health care facility- Exam completed, labs reviewed, she refused a flu vaccine, mammogram and Pap are up-to-date, patient education material was given.  Hypokalemia -     Comprehensive metabolic panel; Future -     Magnesium; Future  Pure hypercholesterolemia- She does not have an elevated ASCVD risk score so I did not recommend a statin for CV risk reduction. -     Comprehensive metabolic panel; Future -     Lipid panel; Future -     TSH; Future  Other abnormal glucose-  Her blood sugars are normal now. -     Hemoglobin A1c; Future  Deficiency anemia- I will monitor her for vitamin deficiencies. -     Vitamin B12; Future -     Folate; Future  Vitamin D deficiency disease -     Cholecalciferol 1.25 MG (50000 UT) capsule; Take 1 capsule (50,000 Units total) by mouth once a week.   I have discontinued Mackenze Picklesimer's cholecalciferol, linaclotide, hydrochlorothiazide, diazepam, zolpidem, ibuprofen, and FERRALET 90. I am also having her start on Cholecalciferol and triamterene-hydrochlorothiazide. Additionally, I am having her maintain her ALPRAZolam.  Meds ordered this encounter  Medications  . Cholecalciferol 1.25 MG (50000 UT) capsule    Sig: Take 1 capsule (50,000 Units total) by mouth once a week.    Dispense:  12 capsule    Refill:  0  . triamterene-hydrochlorothiazide (DYAZIDE) 37.5-25 MG capsule    Sig: Take 1 each (1 capsule total) by mouth daily.    Dispense:  90 capsule    Refill:  0     Follow-up: Return in about 6 weeks (around 02/28/2019).  Sanda Linger, MD

## 2019-01-18 ENCOUNTER — Encounter: Payer: Self-pay | Admitting: Internal Medicine

## 2019-02-12 ENCOUNTER — Other Ambulatory Visit: Payer: Self-pay | Admitting: Women's Health

## 2019-02-12 NOTE — Telephone Encounter (Signed)
Okay for refill?  

## 2019-02-12 NOTE — Telephone Encounter (Signed)
Annual exam scheduled on 03/13/19. 

## 2019-03-08 ENCOUNTER — Other Ambulatory Visit: Payer: Self-pay

## 2019-03-12 ENCOUNTER — Other Ambulatory Visit: Payer: Self-pay

## 2019-03-12 ENCOUNTER — Encounter: Payer: Self-pay | Admitting: Women's Health

## 2019-03-12 ENCOUNTER — Ambulatory Visit (INDEPENDENT_AMBULATORY_CARE_PROVIDER_SITE_OTHER): Payer: BLUE CROSS/BLUE SHIELD | Admitting: Women's Health

## 2019-03-12 VITALS — BP 140/84 | Ht 67.0 in | Wt 219.0 lb

## 2019-03-12 DIAGNOSIS — Z01419 Encounter for gynecological examination (general) (routine) without abnormal findings: Secondary | ICD-10-CM

## 2019-03-12 DIAGNOSIS — Z113 Encounter for screening for infections with a predominantly sexual mode of transmission: Secondary | ICD-10-CM

## 2019-03-12 NOTE — Patient Instructions (Signed)
Vit D 1000 iu daily Health Maintenance, Female Adopting a healthy lifestyle and getting preventive care can go a long way to promote health and wellness. Talk with your health care provider about what schedule of regular examinations is right for you. This is a good chance for you to check in with your provider about disease prevention and staying healthy. In between checkups, there are plenty of things you can do on your own. Experts have done a lot of research about which lifestyle changes and preventive measures are most likely to keep you healthy. Ask your health care provider for more information. Weight and diet Eat a healthy diet  Be sure to include plenty of vegetables, fruits, low-fat dairy products, and lean protein.  Do not eat a lot of foods high in solid fats, added sugars, or salt.  Get regular exercise. This is one of the most important things you can do for your health. ? Most adults should exercise for at least 150 minutes each week. The exercise should increase your heart rate and make you sweat (moderate-intensity exercise). ? Most adults should also do strengthening exercises at least twice a week. This is in addition to the moderate-intensity exercise. Maintain a healthy weight  Body mass index (BMI) is a measurement that can be used to identify possible weight problems. It estimates body fat based on height and weight. Your health care provider can help determine your BMI and help you achieve or maintain a healthy weight.  For females 20 years of age and older: ? A BMI below 18.5 is considered underweight. ? A BMI of 18.5 to 24.9 is normal. ? A BMI of 25 to 29.9 is considered overweight. ? A BMI of 30 and above is considered obese. Watch levels of cholesterol and blood lipids  You should start having your blood tested for lipids and cholesterol at 44 years of age, then have this test every 5 years.  You may need to have your cholesterol levels checked more often  if: ? Your lipid or cholesterol levels are high. ? You are older than 44 years of age. ? You are at high risk for heart disease. Cancer screening Lung Cancer  Lung cancer screening is recommended for adults 55-80 years old who are at high risk for lung cancer because of a history of smoking.  A yearly low-dose CT scan of the lungs is recommended for people who: ? Currently smoke. ? Have quit within the past 15 years. ? Have at least a 30-pack-year history of smoking. A pack year is smoking an average of one pack of cigarettes a day for 1 year.  Yearly screening should continue until it has been 15 years since you quit.  Yearly screening should stop if you develop a health problem that would prevent you from having lung cancer treatment. Breast Cancer  Practice breast self-awareness. This means understanding how your breasts normally appear and feel.  It also means doing regular breast self-exams. Let your health care provider know about any changes, no matter how small.  If you are in your 20s or 30s, you should have a clinical breast exam (CBE) by a health care provider every 1-3 years as part of a regular health exam.  If you are 40 or older, have a CBE every year. Also consider having a breast X-ray (mammogram) every year.  If you have a family history of breast cancer, talk to your health care provider about genetic screening.  If you are at high   risk for breast cancer, talk to your health care provider about having an MRI and a mammogram every year.  Breast cancer gene (BRCA) assessment is recommended for women who have family members with BRCA-related cancers. BRCA-related cancers include: ? Breast. ? Ovarian. ? Tubal. ? Peritoneal cancers.  Results of the assessment will determine the need for genetic counseling and BRCA1 and BRCA2 testing. Cervical Cancer Your health care provider may recommend that you be screened regularly for cancer of the pelvic organs (ovaries,  uterus, and vagina). This screening involves a pelvic examination, including checking for microscopic changes to the surface of your cervix (Pap test). You may be encouraged to have this screening done every 3 years, beginning at age 75.  For women ages 50-65, health care providers may recommend pelvic exams and Pap testing every 3 years, or they may recommend the Pap and pelvic exam, combined with testing for human papilloma virus (HPV), every 5 years. Some types of HPV increase your risk of cervical cancer. Testing for HPV may also be done on women of any age with unclear Pap test results.  Other health care providers may not recommend any screening for nonpregnant women who are considered low risk for pelvic cancer and who do not have symptoms. Ask your health care provider if a screening pelvic exam is right for you.  If you have had past treatment for cervical cancer or a condition that could lead to cancer, you need Pap tests and screening for cancer for at least 20 years after your treatment. If Pap tests have been discontinued, your risk factors (such as having a new sexual partner) need to be reassessed to determine if screening should resume. Some women have medical problems that increase the chance of getting cervical cancer. In these cases, your health care provider may recommend more frequent screening and Pap tests. Colorectal Cancer  This type of cancer can be detected and often prevented.  Routine colorectal cancer screening usually begins at 44 years of age and continues through 44 years of age.  Your health care provider may recommend screening at an earlier age if you have risk factors for colon cancer.  Your health care provider may also recommend using home test kits to check for hidden blood in the stool.  A small camera at the end of a tube can be used to examine your colon directly (sigmoidoscopy or colonoscopy). This is done to check for the earliest forms of colorectal  cancer.  Routine screening usually begins at age 17.  Direct examination of the colon should be repeated every 5-10 years through 44 years of age. However, you may need to be screened more often if early forms of precancerous polyps or small growths are found. Skin Cancer  Check your skin from head to toe regularly.  Tell your health care provider about any new moles or changes in moles, especially if there is a change in a mole's shape or color.  Also tell your health care provider if you have a mole that is larger than the size of a pencil eraser.  Always use sunscreen. Apply sunscreen liberally and repeatedly throughout the day.  Protect yourself by wearing long sleeves, pants, a wide-brimmed hat, and sunglasses whenever you are outside. Heart disease, diabetes, and high blood pressure  High blood pressure causes heart disease and increases the risk of stroke. High blood pressure is more likely to develop in: ? People who have blood pressure in the high end of the normal range (  130-139/85-89 mm Hg). ? People who are overweight or obese. ? People who are African American.  If you are 110-28 years of age, have your blood pressure checked every 3-5 years. If you are 42 years of age or older, have your blood pressure checked every year. You should have your blood pressure measured twice-once when you are at a hospital or clinic, and once when you are not at a hospital or clinic. Record the average of the two measurements. To check your blood pressure when you are not at a hospital or clinic, you can use: ? An automated blood pressure machine at a pharmacy. ? A home blood pressure monitor.  If you are between 28 years and 6 years old, ask your health care provider if you should take aspirin to prevent strokes.  Have regular diabetes screenings. This involves taking a blood sample to check your fasting blood sugar level. ? If you are at a normal weight and have a low risk for diabetes,  have this test once every three years after 44 years of age. ? If you are overweight and have a high risk for diabetes, consider being tested at a younger age or more often. Preventing infection Hepatitis B  If you have a higher risk for hepatitis B, you should be screened for this virus. You are considered at high risk for hepatitis B if: ? You were born in a country where hepatitis B is common. Ask your health care provider which countries are considered high risk. ? Your parents were born in a high-risk country, and you have not been immunized against hepatitis B (hepatitis B vaccine). ? You have HIV or AIDS. ? You use needles to inject street drugs. ? You live with someone who has hepatitis B. ? You have had sex with someone who has hepatitis B. ? You get hemodialysis treatment. ? You take certain medicines for conditions, including cancer, organ transplantation, and autoimmune conditions. Hepatitis C  Blood testing is recommended for: ? Everyone born from [birth year] through 1966. ? Anyone with known risk factors for hepatitis C. Sexually transmitted infections (STIs)  You should be screened for sexually transmitted infections (STIs) including gonorrhea and chlamydia if: ? You are sexually active and are younger than 44 years of age. ? You are older than 44 years of age and your health care provider tells you that you are at risk for this type of infection. ? Your sexual activity has changed since you were last screened and you are at an increased risk for chlamydia or gonorrhea. Ask your health care provider if you are at risk.  If you do not have HIV, but are at risk, it may be recommended that you take a prescription medicine daily to prevent HIV infection. This is called pre-exposure prophylaxis (PrEP). You are considered at risk if: ? You are sexually active and do not regularly use condoms or know the HIV status of your partner(s). ? You take drugs by injection. ? You are sexually  active with a partner who has HIV. Talk with your health care provider about whether you are at high risk of being infected with HIV. If you choose to begin PrEP, you should first be tested for HIV. You should then be tested every 3 months for as long as you are taking PrEP. Pregnancy  If you are premenopausal and you may become pregnant, ask your health care provider about preconception counseling.  If you may become pregnant, take 400 to 800 micrograms (  mcg) of folic acid every day.  If you want to prevent pregnancy, talk to your health care provider about birth control (contraception). Osteoporosis and menopause  Osteoporosis is a disease in which the bones lose minerals and strength with aging. This can result in serious bone fractures. Your risk for osteoporosis can be identified using a bone density scan.  If you are 44 years of age or older, or if you are at risk for osteoporosis and fractures, ask your health care provider if you should be screened.  Ask your health care provider whether you should take a calcium or vitamin D supplement to lower your risk for osteoporosis.  Menopause may have certain physical symptoms and risks.  Hormone replacement therapy may reduce some of these symptoms and risks. Talk to your health care provider about whether hormone replacement therapy is right for you. Follow these instructions at home:  Schedule regular health, dental, and eye exams.  Stay current with your immunizations.  Do not use any tobacco products including cigarettes, chewing tobacco, or electronic cigarettes.  If you are pregnant, do not drink alcohol.  If you are breastfeeding, limit how much and how often you drink alcohol.  Limit alcohol intake to no more than 1 drink per day for nonpregnant women. One drink equals 12 ounces of beer, 5 ounces of wine, or 1 ounces of hard liquor.  Do not use street drugs.  Do not share needles.  Ask your health care provider for  help if you need support or information about quitting drugs.  Tell your health care provider if you often feel depressed.  Tell your health care provider if you have ever been abused or do not feel safe at home. This information is not intended to replace advice given to you by your health care provider. Make sure you discuss any questions you have with your health care provider. Document Released: 05/31/2011 Document Revised: 04/22/2016 Document Reviewed: 08/19/2015 Elsevier Interactive Patient Education  2019 [employer].

## 2019-03-12 NOTE — Progress Notes (Signed)
Faith Sellers 1976-01-20 [id number]    History:    Presents for annual exam.  Monthly 3 to 4-day cycle/condoms/same partner.  Normal Pap and mammogram history.  Was having cycles lasting greater than 7 days, lost almost 20 pounds and cycles became more normal.  Primary care manages hypertension, low vitamin D and anxiety.  Twin sister thyroidectomy.  Past medical history, past surgical history, family history and social history were all reviewed and documented in the EPIC chart.  Works at [employer] at home.  Adopted.  Cared for her mother prior to death in 04/11/2015 from a brain tumor and struggle with some depression after.  Twin sister anxiety and depression.  Has 2 sons ages 84 and 9 both doing well.  ROS:  A ROS was performed and pertinent positives and negatives are included.  Exam:  Vitals:   03/12/19 1225  BP: 140/84  Weight: 219 lb (99.3 kg)  Height: 5\' 7"  (1.702 m)   Body mass index is 34.3 kg/m.   General appearance:  Normal Thyroid:  Symmetrical, normal in size, without palpable masses or nodularity. Respiratory  Auscultation:  Clear without wheezing or rhonchi Cardiovascular  Auscultation:  Regular rate, without rubs, murmurs or gallops  Edema/varicosities:  Not grossly evident Abdominal  Soft,nontender, without masses, guarding or rebound.  Liver/spleen:  No organomegaly noted  Hernia:  None appreciated  Skin  Inspection:  Grossly normal   Breasts: Examined lying and sitting.     Right: Without masses, retractions, discharge or axillary adenopathy.     Left: Without masses, retractions, discharge or axillary adenopathy. Gentitourinary   Inguinal/mons:  Normal without inguinal adenopathy  External genitalia:  Normal  BUS/Urethra/Skene's glands:  Normal  Vagina:  Normal  Cervix:  Normal  Uterus:   normal in size, shape and contour.  Midline and mobile  Adnexa/parametria:     Rt: Without masses or tenderness.   Lt: Without masses or tenderness.  Anus and  perineum: Normal  Digital rectal exam: Normal sphincter tone without palpated masses or tenderness  Assessment/Plan:  43 y.o. SBF G4, P2 for annual exam with no complaints.  Monthly cycle/condoms STD screen per request no symptoms/same partner Hypertension-primary care manages labs and meds Obesity  Plan: Contraception reviewed, declines will continue condoms.  SBEs, continue annual screening mammogram, calcium rich foods, vitamin D supplement as prescribed.  Continue decreasing calorie/carbs and increasing exercise.  Congratulated on 20 pound weight loss.  GC/chlamydia, HIV, RPR.  Pap normal 10-Apr-2018, new screening guidelines reviewed.   Harrington Challenger [facility], 1:04 PM 03/12/2019

## 2019-03-13 ENCOUNTER — Encounter: Payer: BLUE CROSS/BLUE SHIELD | Admitting: Women's Health

## 2019-03-13 LAB — C. TRACHOMATIS/N. GONORRHOEAE RNA
C. trachomatis RNA, TMA: NOT DETECTED
N. gonorrhoeae RNA, TMA: NOT DETECTED

## 2019-03-13 LAB — RPR: RPR Ser Ql: NONREACTIVE

## 2019-03-13 LAB — HIV ANTIBODY (ROUTINE TESTING W REFLEX): HIV 1&2 Ab, 4th Generation: NONREACTIVE

## 2019-05-19 DIAGNOSIS — F410 Panic disorder [episodic paroxysmal anxiety] without agoraphobia: Secondary | ICD-10-CM | POA: Diagnosis not present

## 2019-06-18 ENCOUNTER — Other Ambulatory Visit: Payer: Self-pay | Admitting: Women's Health

## 2019-06-18 NOTE — Telephone Encounter (Signed)
Ok for refill? 

## 2019-08-16 ENCOUNTER — Encounter: Payer: Self-pay | Admitting: Women's Health

## 2019-08-16 DIAGNOSIS — Z1231 Encounter for screening mammogram for malignant neoplasm of breast: Secondary | ICD-10-CM | POA: Diagnosis not present

## 2019-08-21 ENCOUNTER — Encounter: Payer: Self-pay | Admitting: Gynecology

## 2019-09-07 ENCOUNTER — Other Ambulatory Visit: Payer: Self-pay | Admitting: Women's Health

## 2019-09-07 NOTE — Telephone Encounter (Signed)
[place] for refill, pl call and review not to take too much, hard on kidneys

## 2019-09-07 NOTE — Telephone Encounter (Signed)
Spoke with patient and advised her. ?

## 2019-10-11 DIAGNOSIS — Z20828 Contact with and (suspected) exposure to other viral communicable diseases: Secondary | ICD-10-CM | POA: Diagnosis not present

## 2019-10-11 DIAGNOSIS — Z03818 Encounter for observation for suspected exposure to other biological agents ruled out: Secondary | ICD-10-CM | POA: Diagnosis not present

## 2019-10-15 DIAGNOSIS — Z20828 Contact with and (suspected) exposure to other viral communicable diseases: Secondary | ICD-10-CM | POA: Diagnosis not present

## 2020-01-21 ENCOUNTER — Encounter: Payer: BLUE CROSS/BLUE SHIELD | Admitting: Internal Medicine

## 2020-02-07 ENCOUNTER — Encounter: Payer: Self-pay | Admitting: Internal Medicine

## 2020-02-07 ENCOUNTER — Other Ambulatory Visit: Payer: Self-pay

## 2020-02-07 ENCOUNTER — Ambulatory Visit (INDEPENDENT_AMBULATORY_CARE_PROVIDER_SITE_OTHER): Payer: 59 | Admitting: Internal Medicine

## 2020-02-07 ENCOUNTER — Encounter: Payer: 59 | Admitting: Internal Medicine

## 2020-02-07 VITALS — BP 158/108 | HR 78 | Temp 98.2°F | Resp 16 | Ht 67.0 in | Wt 234.0 lb

## 2020-02-07 DIAGNOSIS — D500 Iron deficiency anemia secondary to blood loss (chronic): Secondary | ICD-10-CM | POA: Diagnosis not present

## 2020-02-07 DIAGNOSIS — I10 Essential (primary) hypertension: Secondary | ICD-10-CM

## 2020-02-07 DIAGNOSIS — Z0000 Encounter for general adult medical examination without abnormal findings: Secondary | ICD-10-CM

## 2020-02-07 DIAGNOSIS — R10814 Left lower quadrant abdominal tenderness: Secondary | ICD-10-CM

## 2020-02-07 DIAGNOSIS — K5792 Diverticulitis of intestine, part unspecified, without perforation or abscess without bleeding: Secondary | ICD-10-CM

## 2020-02-07 DIAGNOSIS — E559 Vitamin D deficiency, unspecified: Secondary | ICD-10-CM

## 2020-02-07 LAB — LIPID PANEL
Cholesterol: 216 mg/dL — ABNORMAL HIGH (ref 0–200)
HDL: 40.7 mg/dL (ref >39.00)
LDL Cholesterol: 148 mg/dL — ABNORMAL HIGH (ref 0–99)
NonHDL: 175.26
Total CHOL/HDL Ratio: 5
Triglycerides: 134 mg/dL (ref 0.0–149.0)
VLDL: 26.8 mg/dL (ref 0.0–40.0)

## 2020-02-07 LAB — URINALYSIS, ROUTINE W REFLEX MICROSCOPIC
Bilirubin Urine: NEGATIVE
Hgb urine dipstick: NEGATIVE
Ketones, ur: NEGATIVE
Leukocytes,Ua: NEGATIVE
Nitrite: NEGATIVE
RBC / HPF: NONE SEEN (ref 0–?)
Specific Gravity, Urine: 1.015 (ref 1.000–1.030)
Total Protein, Urine: NEGATIVE
Urine Glucose: NEGATIVE
Urobilinogen, UA: 0.2 (ref 0.0–1.0)
pH: 7 (ref 5.0–8.0)

## 2020-02-07 LAB — BASIC METABOLIC PANEL
BUN: 14 mg/dL (ref 6–23)
CO2: 30 mEq/L (ref 19–32)
Calcium: 9.3 mg/dL (ref 8.4–10.5)
Chloride: 99 mEq/L (ref 96–112)
Creatinine, Ser: 0.78 mg/dL (ref 0.40–1.20)
GFR: 96.92 mL/min (ref >60.00)
Glucose, Bld: 97 mg/dL (ref 70–99)
Potassium: 3.5 mEq/L (ref 3.5–5.1)
Sodium: 137 mEq/L (ref 135–145)

## 2020-02-07 LAB — POCT URINE PREGNANCY: Preg Test, Ur: NEGATIVE

## 2020-02-07 LAB — FERRITIN: Ferritin: 36.8 ng/mL (ref 10.0–291.0)

## 2020-02-07 LAB — IBC PANEL
Iron: 69 ug/dL (ref 42–145)
Saturation Ratios: 16.3 % — ABNORMAL LOW (ref 20.0–50.0)
Transferrin: 302 mg/dL (ref 212.0–360.0)

## 2020-02-07 LAB — VITAMIN D 25 HYDROXY (VIT D DEFICIENCY, FRACTURES): VITD: 20.64 ng/mL — ABNORMAL LOW (ref 30.00–100.00)

## 2020-02-07 LAB — C-REACTIVE PROTEIN: CRP: 1.2 mg/dL (ref 0.5–20.0)

## 2020-02-07 LAB — HCG, QUANTITATIVE, PREGNANCY: Quantitative HCG: <0.6 m[IU]/mL

## 2020-02-07 LAB — TSH: TSH: 1.88 u[IU]/mL (ref 0.35–4.50)

## 2020-02-07 LAB — LIPASE: Lipase: 43 U/L (ref 11.0–59.0)

## 2020-02-07 LAB — AMYLASE: Amylase: 26 U/L — ABNORMAL LOW (ref 27–131)

## 2020-02-07 MED ORDER — NEBIVOLOL HCL 10 MG PO TABS: 10.0000 mg | ORAL_TABLET | Freq: Every day | ORAL | 0 refills | Status: DC

## 2020-02-07 MED ORDER — TRIAMTERENE-HCTZ 37.5-25 MG PO CAPS: 1.0000 | ORAL_CAPSULE | Freq: Every day | ORAL | 0 refills | Status: DC

## 2020-02-07 MED ORDER — AMOXICILLIN-POT CLAVULANATE 875-125 MG PO TABS: 1.0000 | ORAL_TABLET | Freq: Two times a day (BID) | ORAL | 0 refills | Status: DC

## 2020-02-07 NOTE — Progress Notes (Addendum)
Subjective:  Patient ID: Faith Sellers, female    DOB: 1976-07-29  Age: 44 y.o. MRN: [id number]  CC: Abdominal Pain, Annual Exam, and Hypertension   HPI Arron Mcnaught presents for a CPX.  She complains of a 3-day history of left upper quadrant and left lower quadrant abdominal pain.  She describes it as an intermittent cramping and pressure sensation.  She says this is identical to the abdominal pain she experienced in the past when she had diverticulitis.  She says her bowel movements are normal with no blood, mucus, or diarrhea.  She denies loss of appetite, nausea, vomiting, fever, chills, dysuria, or hematuria.  She is not compliant with her antihypertensive diuretic.  She denies any recent episodes of headache, blurred vision, chest pain, shortness of breath, dyspnea on exertion, edema, or fatigue.  Outpatient Medications Prior to Visit  Medication Sig Dispense Refill  . ALPRAZolam (XANAX) 1 MG tablet Take 1 mg by mouth at bedtime as needed for anxiety.    . Cholecalciferol 1.25 MG (50000 UT) capsule Take 1 capsule (50,000 Units total) by mouth once a week. 12 capsule 0  . ibuprofen (ADVIL) 800 MG tablet TAKE 1 TABLET(800 MG) BY MOUTH EVERY 8 HOURS AS NEEDED 30 tablet 1  . triamterene-hydrochlorothiazide (DYAZIDE) 37.5-25 MG capsule Take 1 each (1 capsule total) by mouth daily. 90 capsule 0   No facility-administered medications prior to visit.    ROS Review of Systems  Constitutional: Positive for unexpected weight change (wt gain). Negative for appetite change, chills, diaphoresis, fatigue and fever.  HENT: Negative.   Eyes: Negative.   Respiratory: Negative for cough, chest tightness, shortness of breath and wheezing.   Cardiovascular: Negative for chest pain, palpitations and leg swelling.  Gastrointestinal: Positive for abdominal pain. Negative for abdominal distention, blood in stool, constipation, diarrhea, nausea and vomiting.  Endocrine: Negative.   Genitourinary:  Negative.  Negative for decreased urine volume, difficulty urinating, dysuria, flank pain, hematuria and urgency.  Musculoskeletal: Negative.  Negative for arthralgias.  Skin: Negative.  Negative for rash.  Neurological: Negative.  Negative for dizziness, weakness and headaches.  Hematological: Negative for adenopathy. Does not bruise/bleed easily.  Psychiatric/Behavioral: Negative.     Objective:  BP (!) 158/108 (BP Location: Right Arm, Patient Position: Sitting, Cuff Size: Large)   Pulse 78   Temp 98.2 F (36.8 C) (Oral)   Resp 16   Ht 5\' 7"  (1.702 m)   Wt 234 lb (106.1 kg)   LMP 01/24/2020   SpO2 98%   BMI 36.65 kg/m   BP Readings from Last 3 Encounters:  02/07/20 (!) 158/108  03/12/19 140/84  01/17/19 (!) 154/96    Wt Readings from Last 3 Encounters:  02/07/20 234 lb (106.1 kg)  03/12/19 219 lb (99.3 kg)  01/17/19 215 lb (97.5 kg)    Physical Exam Vitals reviewed.  Constitutional:      General: She is not in acute distress.    Appearance: She is obese. She is not toxic-appearing or diaphoretic.  HENT:     Nose: Nose normal.     Mouth/Throat:     Mouth: Mucous membranes are moist.  Eyes:     General: No scleral icterus.    Conjunctiva/sclera: Conjunctivae normal.  Cardiovascular:     Rate and Rhythm: Normal rate and regular rhythm.     Heart sounds: No murmur.  Pulmonary:     Effort: Pulmonary effort is normal.     Breath sounds: No stridor. No wheezing, rhonchi or rales.  Abdominal:     General: Abdomen is protuberant. Bowel sounds are normal. There is no distension.     Palpations: Abdomen is soft. There is no hepatomegaly, splenomegaly or mass.     Tenderness: There is abdominal tenderness in the epigastric area and left lower quadrant. There is no guarding or rebound.     Hernia: No hernia is present.  Genitourinary:    Rectum: Normal.  Musculoskeletal:        General: Normal range of motion.     Cervical back: Neck supple.     Right lower leg: No  edema.  Lymphadenopathy:     Cervical: No cervical adenopathy.  Skin:    General: Skin is warm and dry.  Neurological:     General: No focal deficit present.     Mental Status: She is alert.  Psychiatric:        Mood and Affect: Mood normal.        Behavior: Behavior normal.     Lab Results  Component Value Date   WBC 9.1 02/07/2020   HGB 13.9 02/07/2020   HCT 41.3 02/07/2020   PLT 273.0 02/07/2020   GLUCOSE 97 02/07/2020   CHOL 216 (H) 02/07/2020   TRIG 134.0 02/07/2020   HDL 40.70 02/07/2020   LDLDIRECT 151.2 07/19/2013   LDLCALC 148 (H) 02/07/2020   ALT 8 01/17/2019   AST 10 01/17/2019   NA 137 02/07/2020   K 3.5 02/07/2020   CL 99 02/07/2020   CREATININE 0.78 02/07/2020   BUN 14 02/07/2020   CO2 30 02/07/2020   TSH 1.88 02/07/2020   INR 0.96 08/28/2011   HGBA1C 5.4 01/17/2019    No results found.  Assessment & Plan:   Glanda was seen today for abdominal pain, annual exam and hypertension.  Diagnoses and all orders for this visit:  Essential hypertension, benign- Her blood pressure is not adequately well controlled - mostly due to noncompliance.  I have asked to be more compliant with Dyazide and will add nebivolol as well.  Her labs are negative for secondary causes or endorgan damage. -     nebivolol (BYSTOLIC) 10 MG tablet; Take 1 tablet (10 mg total) by mouth daily. -     triamterene-hydrochlorothiazide (DYAZIDE) 37.5-25 MG capsule; Take 1 each (1 capsule total) by mouth daily. -     Basic metabolic panel -     TSH -     Urinalysis, Routine w reflex microscopic -     VITAMIN D 25 Hydroxy (Vit-D Deficiency, Fractures) -     CBC with Differential/Platelet  Iron deficiency anemia due to chronic blood loss- Her H&H are normal now. -     IBC panel -     Ferritin -     CBC with Differential/Platelet  Routine general medical examination at a health care facility- Exam completed, labs reviewed-she does not have an elevated ASCVD risk score so I did not  recommend a statin for CV risk reduction, breast/cervical/colon cancer screening are all up-to-date, she refused a flu vaccine, patient education material was given. -     Lipid panel  Left lower quadrant abdominal tenderness without rebound tenderness- Her examination is not consistent with an acute abdominal process other than the possibility of diverticulitis.  Her labs are reassuring and her pregnancy test is negative. -     Lipase -     Amylase -     hCG, quantitative, pregnancy -     C-reactive protein -  POCT urine pregnancy  Diverticulitis- I initially recommended treating this with Augmentin but she tells me that this has caused diarrhea in the past.  She previously had a good response to Cipro and Flagyl.  I recommended promethazine as needed in the event that the antibiotics cause nausea and vomiting.. -     C-reactive protein -     Discontinue: amoxicillin-clavulanate (AUGMENTIN) 875-125 MG tablet; Take 1 tablet by mouth 2 (two) times daily for 10 days. -     ciprofloxacin (CIPRO) 500 MG tablet; Take 1 tablet (500 mg total) by mouth 2 (two) times daily for 7 days. -     metroNIDAZOLE (FLAGYL) 500 MG tablet; Take 1 tablet (500 mg total) by mouth 3 (three) times daily for 7 days. -     promethazine (PHENERGAN) 12.5 MG tablet; Take 1 tablet (12.5 mg total) by mouth every 6 (six) hours as needed for nausea or vomiting.  Vitamin D deficiency -     Cholecalciferol 50 MCG (2000 UT) TABS; Take 1 tablet (2,000 Units total) by mouth daily.   I have discontinued Dynver Carby's Cholecalciferol, ibuprofen, and amoxicillin-clavulanate. I am also having her start on nebivolol, ciprofloxacin, metroNIDAZOLE, promethazine, and Cholecalciferol. Additionally, I am having her maintain her ALPRAZolam and triamterene-hydrochlorothiazide.  Meds ordered this encounter  Medications  . nebivolol (BYSTOLIC) 10 MG tablet    Sig: Take 1 tablet (10 mg total) by mouth daily.    Dispense:  90 tablet     Refill:  0  . triamterene-hydrochlorothiazide (DYAZIDE) 37.5-25 MG capsule    Sig: Take 1 each (1 capsule total) by mouth daily.    Dispense:  90 capsule    Refill:  0  . DISCONTD: amoxicillin-clavulanate (AUGMENTIN) 875-125 MG tablet    Sig: Take 1 tablet by mouth 2 (two) times daily for 10 days.    Dispense:  20 tablet    Refill:  0  . ciprofloxacin (CIPRO) 500 MG tablet    Sig: Take 1 tablet (500 mg total) by mouth 2 (two) times daily for 7 days.    Dispense:  14 tablet    Refill:  0  . metroNIDAZOLE (FLAGYL) 500 MG tablet    Sig: Take 1 tablet (500 mg total) by mouth 3 (three) times daily for 7 days.    Dispense:  21 tablet    Refill:  0  . promethazine (PHENERGAN) 12.5 MG tablet    Sig: Take 1 tablet (12.5 mg total) by mouth every 6 (six) hours as needed for nausea or vomiting.    Dispense:  30 tablet    Refill:  0  . Cholecalciferol 50 MCG (2000 UT) TABS    Sig: Take 1 tablet (2,000 Units total) by mouth daily.    Dispense:  90 tablet    Refill:  1     Follow-up: Return today (on 02/07/2020).  Scarlette Calico, MD

## 2020-02-07 NOTE — Patient Instructions (Signed)
Abdominal Pain, Adult Pain in the abdomen (abdominal pain) can be caused by many things. Often, abdominal pain is not serious and it gets better with no treatment or by being treated at home. However, sometimes abdominal pain is serious. Your health care provider will ask questions about your medical history and do a physical exam to try to determine the cause of your abdominal pain. Follow these instructions at home:  Medicines  Take over-the-counter and prescription medicines only as told by your health care provider.  Do not take a laxative unless told by your health care provider. General instructions  Watch your condition for any changes.  Drink enough fluid to keep your urine pale yellow.  Keep all follow-up visits as told by your health care provider. This is important. Contact a health care provider if:  Your abdominal pain changes or gets worse.  You are not hungry or you lose weight without trying.  You are constipated or have diarrhea for more than 2-3 days.  You have pain when you urinate or have a bowel movement.  Your abdominal pain wakes you up at night.  Your pain gets worse with meals, after eating, or with certain foods.  You are vomiting and cannot keep anything down.  You have a fever.  You have blood in your urine. Get help right away if:  Your pain does not go away as soon as your health care provider told you to expect.  You cannot stop vomiting.  Your pain is only in areas of the abdomen, such as the right side or the left lower portion of the abdomen. Pain on the right side could be caused by appendicitis.  You have bloody or black stools, or stools that look like tar.  You have severe pain, cramping, or bloating in your abdomen.  You have signs of dehydration, such as: ? Dark urine, very little urine, or no urine. ? Cracked lips. ? Dry mouth. ? Sunken eyes. ? Sleepiness. ? Weakness.  You have trouble breathing or chest  pain. Summary  Often, abdominal pain is not serious and it gets better with no treatment or by being treated at home. However, sometimes abdominal pain is serious.  Watch your condition for any changes.  Take over-the-counter and prescription medicines only as told by your health care provider.  Contact a health care provider if your abdominal pain changes or gets worse.  Get help right away if you have severe pain, cramping, or bloating in your abdomen. This information is not intended to replace advice given to you by your health care provider. Make sure you discuss any questions you have with your health care provider. Document Revised: 03/26/2019 Document Reviewed: 03/26/2019 Elsevier Patient Education  2020 [employer] Inc[employer]  

## 2020-02-08 ENCOUNTER — Encounter: Payer: Self-pay | Admitting: Internal Medicine

## 2020-02-08 DIAGNOSIS — E559 Vitamin D deficiency, unspecified: Secondary | ICD-10-CM | POA: Insufficient documentation

## 2020-02-08 LAB — CBC WITH DIFFERENTIAL/PLATELET
Basophils Absolute: 0.1 10*3/uL (ref 0.0–0.1)
Basophils Relative: 0.9 % (ref 0.0–3.0)
Eosinophils Absolute: 0.2 10*3/uL (ref 0.0–0.7)
Eosinophils Relative: 2.6 % (ref 0.0–5.0)
HCT: 41.3 % (ref 36.0–46.0)
Hemoglobin: 13.9 g/dL (ref 12.0–15.0)
Lymphocytes Relative: 37.6 % (ref 12.0–46.0)
Lymphs Abs: 3.4 10*3/uL (ref 0.7–4.0)
MCHC: 33.7 g/dL (ref 30.0–36.0)
MCV: 84.6 fl (ref 78.0–100.0)
Monocytes Absolute: 0.8 10*3/uL (ref 0.1–1.0)
Monocytes Relative: 8.6 % (ref 3.0–12.0)
Neutro Abs: 4.5 10*3/uL (ref 1.4–7.7)
Neutrophils Relative %: 50.3 % (ref 43.0–77.0)
Platelets: 273 10*3/uL (ref 150.0–400.0)
RBC: 4.88 Mil/uL (ref 3.87–5.11)
RDW: 14.1 % (ref 11.5–15.5)
WBC: 9.1 10*3/uL (ref 4.0–10.5)

## 2020-02-08 MED ORDER — METRONIDAZOLE 500 MG PO TABS: 500.0000 mg | ORAL_TABLET | Freq: Three times a day (TID) | ORAL | 0 refills | Status: AC

## 2020-02-08 MED ORDER — CIPROFLOXACIN HCL 500 MG PO TABS: 500.0000 mg | ORAL_TABLET | Freq: Two times a day (BID) | ORAL | 0 refills | Status: AC

## 2020-02-08 MED ORDER — CHOLECALCIFEROL 50 MCG (2000 UT) PO TABS: 1.0000 | ORAL_TABLET | Freq: Every day | ORAL | 1 refills | Status: DC

## 2020-02-08 MED ORDER — PROMETHAZINE HCL 12.5 MG PO TABS: 12.5000 mg | ORAL_TABLET | Freq: Four times a day (QID) | ORAL | 0 refills | Status: DC | PRN

## 2020-02-09 ENCOUNTER — Encounter: Payer: Self-pay | Admitting: Internal Medicine

## 2020-02-18 ENCOUNTER — Other Ambulatory Visit: Payer: Self-pay | Admitting: Internal Medicine

## 2020-02-18 ENCOUNTER — Encounter: Payer: Self-pay | Admitting: Internal Medicine

## 2020-02-18 DIAGNOSIS — B373 Candidiasis of vulva and vagina: Secondary | ICD-10-CM

## 2020-02-18 DIAGNOSIS — B3731 Acute candidiasis of vulva and vagina: Secondary | ICD-10-CM

## 2020-02-18 MED ORDER — FLUCONAZOLE 150 MG PO TABS: 150.0000 mg | ORAL_TABLET | Freq: Once | ORAL | 3 refills | Status: AC

## 2020-03-03 ENCOUNTER — Telehealth: Payer: Self-pay | Admitting: Internal Medicine

## 2020-03-03 NOTE — Telephone Encounter (Signed)
Patient is requesting a transition of care from thomas jones,md to nicholas wendling,do    Please advise

## 2020-03-03 NOTE — Telephone Encounter (Signed)
OK 

## 2020-03-05 ENCOUNTER — Other Ambulatory Visit: Payer: Self-pay

## 2020-03-05 ENCOUNTER — Encounter: Payer: Self-pay | Admitting: Family Medicine

## 2020-03-05 ENCOUNTER — Ambulatory Visit: Payer: 59 | Admitting: Family Medicine

## 2020-03-05 VITALS — BP 142/90 | HR 71 | Temp 96.1°F | Ht 66.0 in | Wt 234.1 lb

## 2020-03-05 DIAGNOSIS — I10 Essential (primary) hypertension: Secondary | ICD-10-CM | POA: Diagnosis not present

## 2020-03-05 DIAGNOSIS — K644 Residual hemorrhoidal skin tags: Secondary | ICD-10-CM | POA: Diagnosis not present

## 2020-03-05 MED ORDER — HYDROCORTISONE (PERIANAL) 2.5 % EX CREA: 1.0000 "application " | TOPICAL_CREAM | Freq: Two times a day (BID) | CUTANEOUS | 0 refills | Status: DC

## 2020-03-05 MED ORDER — NITROGLYCERIN 0.4 % RE OINT: TOPICAL_OINTMENT | RECTAL | 0 refills | Status: AC

## 2020-03-05 NOTE — Patient Instructions (Signed)
Sitz baths twice daily. Iodine if you are bleeding.  Soften your stools until you are 100% better with your hemorrhoids.  Let me know if things don't turn the corner in the next couple weeks.  Let us know if you need anything.

## 2020-03-05 NOTE — Progress Notes (Signed)
Chief Complaint  Patient presents with  . Hemorrhoids    Subjective: Patient is a 44 y.o. female here for hemorrhoids.  Patient has a 6-day history of an external hemorrhoid.  It is very painful.  It is brought on by hard and small stool.  She soften her stool with over-the-counter preparations that did improve things.  She had another hard stool after stopping Senokot and started having issues again. No bleeding currently. Has tried OTC witch hazel, HC cream.  +hx of HTN. Dr. Yetta Barre placed her on another med recently, has not taken 2/2 pain.   Past Medical History:  Diagnosis Date  . Anxiety   . Diverticulitis   . Gastric ulcer   . GERD (gastroesophageal reflux disease)   . Hypercholesteremia   . Hypertension     Objective: BP (!) 142/90 (BP Location: Left Arm, Patient Position: Sitting, Cuff Size: Normal)   Pulse 71   Temp (!) 96.1 F (35.6 C) (Temporal)   Ht 5\' 6"  (1.676 m)   Wt 234 lb 2 oz (106.2 kg)   SpO2 94%   BMI 37.79 kg/m  General: Awake, appears stated age Rectal: Examined in presence of female chaperone; 2 old ext hemorrhoids noted. Larger R lateral hemorrhoid ttp, no fluctuance or thrombus noted; no erythema or drainage; no fissures or active bleeding Lungs: No accessory muscle use Psych: Age appropriate judgment and insight, normal affect and mood  Assessment and Plan: External hemorrhoid - Plan: Nitroglycerin 0.4 % OINT, hydrocortisone (ANUSOL-HC) 2.5 % rectal cream  Essential hypertension, benign  1-Needs to regulate stools again. Stay hydrated. Go back to stool regimen until fully better. Above creams to expedite healing and decrease inflammation. This is a recurring issue, if it does not improve, will set her up w GI. 2- Needs to take meds. I will see her in 1 mo for this.  The patient voiced understanding and agreement to the plan.  [place], DO 03/05/20  8:05 PM

## 2020-03-14 ENCOUNTER — Telehealth: Payer: Self-pay | Admitting: Family Medicine

## 2020-03-14 DIAGNOSIS — K644 Residual hemorrhoidal skin tags: Secondary | ICD-10-CM

## 2020-03-14 MED ORDER — HYDROCORTISONE (PERIANAL) 2.5 % EX CREA: 1.0000 "application " | TOPICAL_CREAM | Freq: Two times a day (BID) | CUTANEOUS | 0 refills | Status: DC

## 2020-03-14 NOTE — Telephone Encounter (Signed)
Sent in to [facility] Patient informed

## 2020-03-14 NOTE — Telephone Encounter (Signed)
Caller : Berline Semrad Call Back # [phone]  Subject: hydrocortisone (ANUSOL-HC) 2.5 % rectal cream [[id number]   Patient forget medication, patient is out od town in [place]. Please send medication to [facility]  To : [address] [place]  [place] [address] [phone]

## 2020-03-26 ENCOUNTER — Ambulatory Visit: Payer: 59 | Admitting: Family Medicine

## 2020-03-26 ENCOUNTER — Other Ambulatory Visit: Payer: Self-pay

## 2020-03-26 ENCOUNTER — Encounter: Payer: Self-pay | Admitting: Family Medicine

## 2020-03-26 VITALS — BP 142/86 | HR 78 | Temp 96.3°F | Ht 66.0 in | Wt 236.0 lb

## 2020-03-26 DIAGNOSIS — I10 Essential (primary) hypertension: Secondary | ICD-10-CM | POA: Diagnosis not present

## 2020-03-26 DIAGNOSIS — K6289 Other specified diseases of anus and rectum: Secondary | ICD-10-CM | POA: Diagnosis not present

## 2020-03-26 DIAGNOSIS — K5909 Other constipation: Secondary | ICD-10-CM

## 2020-03-26 MED ORDER — LUBIPROSTONE 24 MCG PO CAPS: 24.0000 ug | ORAL_CAPSULE | Freq: Two times a day (BID) | ORAL | 0 refills | Status: DC

## 2020-03-26 MED ORDER — LINACLOTIDE 145 MCG PO CAPS: 145.0000 ug | ORAL_CAPSULE | Freq: Every day | ORAL | 1 refills | Status: DC

## 2020-03-26 NOTE — Patient Instructions (Addendum)
I don't think Linzess will be covered. I would do Amitiza if it is cheaper.   Stay hydrated.  If you do not hear anything about your referral in the next 1-2 weeks, call our office and ask for an update.  Metamucil can be helpful.   Check your blood pressure at home.  Let us know if you need anything.

## 2020-03-26 NOTE — Progress Notes (Signed)
Chief Complaint  Patient presents with  . Hemorrhoids    Subjective: Patient is a 44 y.o. female here for rectal pain.  Pt seen around 1 mo ago and tx'd for ext hemorrhoids that did improve. A week ago, started having harder stools again, which is a recurrent issue for her over the past 17 years, and subsequent rectal pain. She has a hx of hem banding with GI in 2012. No bleeding or abd pain.   Hypertension Patient presents for hypertension follow up. She does not monitor home blood pressures. She is compliant with medications- Maxzide 37.5-25 mg/d, Bystolic 10 mg/d. Patient has these side effects of medication: none She is adhering to a healthy diet overall.  Past Medical History:  Diagnosis Date  . Anxiety   . Diverticulitis   . Gastric ulcer   . GERD (gastroesophageal reflux disease)   . Hypercholesteremia   . Hypertension     Objective: BP (!) 142/86 (BP Location: Left Arm, Patient Position: Sitting, Cuff Size: Normal)   Pulse 78   Temp (!) 96.3 F (35.7 C) (Temporal)   Ht 5\' 6"  (1.676 m)   Wt 236 lb (107 kg)   SpO2 95%   BMI 38.09 kg/m  General: Awake, appears stated age HEENT: MMM, EOMi Heart: RRR, no murmurs Lungs: CTAB, no rales, wheezes or rhonchi. No accessory muscle use Psych: Age appropriate judgment and insight, normal affect and mood  Assessment and Plan: Rectal pain - Plan: Ambulatory referral to Gastroenterology  Constipation, chronic - Plan: Ambulatory referral to Gastroenterology  Essential hypertension, benign  1/2- Refer GI. Cont creams for pain. Will trial Amitiza as it looks like ins will cover. Metamucil rec'd.  3- Opted for lifestyle changes. Counseled on diet/exercise. Monitor BP at home. F/u in 6 weeks for this.  The patient voiced understanding and agreement to the plan.  [place], DO 03/26/20  8:39 AM

## 2020-03-28 ENCOUNTER — Encounter: Payer: Self-pay | Admitting: Gastroenterology

## 2020-03-31 ENCOUNTER — Other Ambulatory Visit: Payer: Self-pay | Admitting: Family Medicine

## 2020-03-31 DIAGNOSIS — K6289 Other specified diseases of anus and rectum: Secondary | ICD-10-CM

## 2020-04-01 ENCOUNTER — Ambulatory Visit: Payer: 59 | Admitting: Family Medicine

## 2020-04-10 ENCOUNTER — Ambulatory Visit: Payer: 59 | Admitting: Physician Assistant

## 2020-05-02 ENCOUNTER — Ambulatory Visit: Payer: 59 | Admitting: Gastroenterology

## 2020-05-02 ENCOUNTER — Other Ambulatory Visit: Payer: Self-pay | Admitting: *Deleted

## 2020-05-02 NOTE — Telephone Encounter (Signed)
Patient called requesting refill on ibuprofen 800 mg tablet, states she using for cramps.

## 2020-05-04 MED ORDER — IBUPROFEN 800 MG PO TABS: 800.0000 mg | ORAL_TABLET | Freq: Three times a day (TID) | ORAL | 1 refills | Status: DC | PRN

## 2020-05-05 ENCOUNTER — Encounter: Payer: Self-pay | Admitting: Family Medicine

## 2020-05-09 ENCOUNTER — Other Ambulatory Visit: Payer: Self-pay

## 2020-05-12 ENCOUNTER — Encounter: Payer: BLUE CROSS/BLUE SHIELD | Admitting: Nurse Practitioner

## 2020-05-12 DIAGNOSIS — Z0289 Encounter for other administrative examinations: Secondary | ICD-10-CM

## 2020-05-19 MED ORDER — LINACLOTIDE 145 MCG PO CAPS: 145.0000 ug | ORAL_CAPSULE | Freq: Every day | ORAL | 1 refills | Status: DC

## 2020-05-27 ENCOUNTER — Ambulatory Visit: Payer: 59 | Admitting: Family Medicine

## 2020-05-30 ENCOUNTER — Ambulatory Visit: Payer: Self-pay | Admitting: Family Medicine

## 2020-05-30 DIAGNOSIS — Z0289 Encounter for other administrative examinations: Secondary | ICD-10-CM

## 2020-07-26 ENCOUNTER — Other Ambulatory Visit: Payer: Self-pay | Admitting: Internal Medicine

## 2020-07-26 DIAGNOSIS — I10 Essential (primary) hypertension: Secondary | ICD-10-CM

## 2020-08-01 ENCOUNTER — Ambulatory Visit: Payer: Self-pay | Admitting: Family Medicine

## 2020-08-07 ENCOUNTER — Ambulatory Visit (INDEPENDENT_AMBULATORY_CARE_PROVIDER_SITE_OTHER): Payer: No Typology Code available for payment source | Admitting: Internal Medicine

## 2020-08-07 ENCOUNTER — Encounter: Payer: Self-pay | Admitting: Internal Medicine

## 2020-08-07 ENCOUNTER — Other Ambulatory Visit: Payer: Self-pay

## 2020-08-07 VITALS — BP 178/106 | HR 67 | Temp 98.2°F | Resp 16 | Ht 66.0 in | Wt 237.0 lb

## 2020-08-07 DIAGNOSIS — K219 Gastro-esophageal reflux disease without esophagitis: Secondary | ICD-10-CM | POA: Insufficient documentation

## 2020-08-07 DIAGNOSIS — K5904 Chronic idiopathic constipation: Secondary | ICD-10-CM | POA: Insufficient documentation

## 2020-08-07 DIAGNOSIS — I10 Essential (primary) hypertension: Secondary | ICD-10-CM

## 2020-08-07 MED ORDER — TRIAMTERENE-HCTZ 37.5-25 MG PO CAPS
1.0000 | ORAL_CAPSULE | Freq: Every day | ORAL | 0 refills | Status: DC
Start: 2020-08-07 — End: 2020-11-20

## 2020-08-07 MED ORDER — ESOMEPRAZOLE MAGNESIUM 40 MG PO CPDR: 40.0000 mg | DELAYED_RELEASE_CAPSULE | Freq: Every day | ORAL | 1 refills | Status: DC

## 2020-08-07 MED ORDER — LINACLOTIDE 145 MCG PO CAPS: 145.0000 ug | ORAL_CAPSULE | Freq: Every day | ORAL | 1 refills | Status: DC

## 2020-08-07 MED ORDER — NEBIVOLOL HCL 10 MG PO TABS: 10.0000 mg | ORAL_TABLET | Freq: Every day | ORAL | 0 refills | Status: DC

## 2020-08-07 NOTE — Progress Notes (Signed)
Subjective:  Patient ID: Faith Sellers, female    DOB: October 23, 1976  Age: 44 y.o. MRN: [id number]  CC: Hypertension  This visit occurred during the SARS-CoV-2 public health emergency.  Safety protocols were in place, including screening questions prior to the visit, additional usage of staff PPE, and extensive cleaning of exam room while observing appropriate contact time as indicated for disinfecting solutions.    HPI Faith Sellers presents for f/up -she recently ran out of her high antihypertensives.  She is concerned her blood pressure is not well controlled.  She is not been working on her lifestyle modifications.  She denies headache, blurred vision, chest pain, shortness of breath, palpitations, edema, or fatigue.  She complains of heartburn but denies odynophagia or dysphagia.  She also complains of mild, chronic, intermittent constipation.  She is getting symptom relief with linaclotinde and requests a refill.  Outpatient Medications Prior to Visit  Medication Sig Dispense Refill  . ALPRAZolam (XANAX) 1 MG tablet Take 1 mg by mouth at bedtime as needed for anxiety.    . Cholecalciferol 50 MCG (2000 UT) TABS Take 1 tablet (2,000 Units total) by mouth daily. 90 tablet 1  . hydrocortisone (ANUSOL-HC) 2.5 % rectal cream Place 1 application rectally 2 (two) times daily. 30 g 0  . ibuprofen (ADVIL) 800 MG tablet Take 1 tablet (800 mg total) by mouth every 8 (eight) hours as needed. 30 tablet 1  . Nitroglycerin 0.4 % OINT Apply thin layer over bottom twice daily. 30 g 0  . linaclotide (LINZESS) 145 MCG CAPS capsule Take 1 capsule (145 mcg total) by mouth daily before breakfast. 30 capsule 1  . lubiprostone (AMITIZA) 24 MCG capsule Take 1 capsule (24 mcg total) by mouth 2 (two) times daily with a meal. 60 capsule 0  . nebivolol (BYSTOLIC) 10 MG tablet Take 1 tablet (10 mg total) by mouth daily. 90 tablet 0  . promethazine (PHENERGAN) 12.5 MG tablet Take 1 tablet (12.5 mg total) by mouth every 6  (six) hours as needed for nausea or vomiting. 30 tablet 0  . triamterene-hydrochlorothiazide (DYAZIDE) 37.5-25 MG capsule Take 1 each (1 capsule total) by mouth daily. 90 capsule 0   No facility-administered medications prior to visit.    ROS Review of Systems  Constitutional: Positive for unexpected weight change. Negative for appetite change, chills, diaphoresis and fatigue.  HENT: Negative for trouble swallowing and voice change.   Eyes: Negative for visual disturbance.  Respiratory: Negative for cough, chest tightness, shortness of breath and wheezing.   Cardiovascular: Negative for chest pain, palpitations and leg swelling.  Gastrointestinal: Positive for constipation. Negative for abdominal pain, blood in stool, diarrhea, nausea and vomiting.  Endocrine: Negative.  Negative for cold intolerance and heat intolerance.  Genitourinary: Negative.  Negative for difficulty urinating and dysuria.  Musculoskeletal: Negative.  Negative for arthralgias, myalgias and neck pain.  Skin: Negative.   Neurological: Negative.  Negative for dizziness, weakness, light-headedness and numbness.  Hematological: Negative for adenopathy. Does not bruise/bleed easily.  Psychiatric/Behavioral: Negative.     Objective:  BP (!) 178/106 Comment: out of med for 2 days  Pulse 67   Temp 98.2 F (36.8 C) (Oral)   Resp 16   Ht 5\' 6"  (1.676 m)   Wt 237 lb (107.5 kg)   SpO2 99%   BMI 38.25 kg/m   BP Readings from Last 3 Encounters:  08/07/20 (!) 178/106  03/26/20 (!) 142/86  03/05/20 (!) 142/90    Wt Readings from Last 3  Encounters:  08/07/20 237 lb (107.5 kg)  03/26/20 236 lb (107 kg)  03/05/20 234 lb 2 oz (106.2 kg)    Physical Exam Vitals reviewed.  Constitutional:      General: She is not in acute distress.    Appearance: She is obese. She is not toxic-appearing or diaphoretic.  HENT:     Nose: Nose normal.     Mouth/Throat:     Mouth: Mucous membranes are moist.  Eyes:     General: No  scleral icterus.    Conjunctiva/sclera: Conjunctivae normal.  Cardiovascular:     Rate and Rhythm: Normal rate and regular rhythm.     Heart sounds: No murmur heard.   Pulmonary:     Effort: Pulmonary effort is normal.     Breath sounds: No stridor. No wheezing, rhonchi or rales.  Abdominal:     General: Abdomen is flat. Bowel sounds are normal. There is no distension.     Palpations: Abdomen is soft. There is no hepatomegaly, splenomegaly or mass.     Tenderness: There is no abdominal tenderness.  Musculoskeletal:        General: Normal range of motion.     Cervical back: Neck supple.     Right lower leg: No edema.     Left lower leg: No edema.  Lymphadenopathy:     Cervical: No cervical adenopathy.  Skin:    General: Skin is warm and dry.     Coloration: Skin is not pale.  Neurological:     General: No focal deficit present.     Mental Status: She is alert.  Psychiatric:        Mood and Affect: Mood normal.        Behavior: Behavior normal.     Lab Results  Component Value Date   WBC 9.1 02/07/2020   HGB 13.9 02/07/2020   HCT 41.3 02/07/2020   PLT 273.0 02/07/2020   GLUCOSE 97 02/07/2020   CHOL 216 (H) 02/07/2020   TRIG 134.0 02/07/2020   HDL 40.70 02/07/2020   LDLDIRECT 151.2 07/19/2013   LDLCALC 148 (H) 02/07/2020   ALT 8 01/17/2019   AST 10 01/17/2019   NA 137 02/07/2020   K 3.5 02/07/2020   CL 99 02/07/2020   CREATININE 0.78 02/07/2020   BUN 14 02/07/2020   CO2 30 02/07/2020   TSH 1.88 02/07/2020   INR 0.96 08/28/2011   HGBA1C 5.4 01/17/2019    No results found.  Assessment & Plan:   L[place]a was seen today for hypertension.  Diagnoses and all orders for this visit:  Chronic idiopathic constipation- Labs are negative for secondary causes.  Will continue linaclotide. -     linaclotide (LINZESS) 145 MCG CAPS capsule; Take 1 capsule (145 mcg total) by mouth daily before breakfast. -     Magnesium; Future -     Magnesium  Essential hypertension,  benign-blood pressure is not adequately well controlled.  Electrolytes and renal function are normal.  Will restart Dyazide and nebivolol. -     triamterene-hydrochlorothiazide (DYAZIDE) 37.5-25 MG capsule; Take 1 each (1 capsule total) by mouth daily. -     nebivolol (BYSTOLIC) 10 MG tablet; Take 1 tablet (10 mg total) by mouth daily. -     BASIC METABOLIC PANEL WITH GFR; Future  Gastroesophageal reflux disease without esophagitis -     esomeprazole (NEXIUM) 40 MG capsule; Take 1 capsule (40 mg total) by mouth daily.   I have discontinued Arohi Lutz's promethazine and lubiprostone.  I am also having her start on esomeprazole. Additionally, I am having her maintain her ALPRAZolam, Cholecalciferol, Nitroglycerin, hydrocortisone, ibuprofen, triamterene-hydrochlorothiazide, linaclotide, and nebivolol.  Meds ordered this encounter  Medications  . triamterene-hydrochlorothiazide (DYAZIDE) 37.5-25 MG capsule    Sig: Take 1 each (1 capsule total) by mouth daily.    Dispense:  90 capsule    Refill:  0  . linaclotide (LINZESS) 145 MCG CAPS capsule    Sig: Take 1 capsule (145 mcg total) by mouth daily before breakfast.    Dispense:  90 capsule    Refill:  1    Requested drug refills are authorized, however, the patient needs further evaluation and/or laboratory testing before further refills are given. Ask her to make an appointment for this.  . nebivolol (BYSTOLIC) 10 MG tablet    Sig: Take 1 tablet (10 mg total) by mouth daily.    Dispense:  90 tablet    Refill:  0  . esomeprazole (NEXIUM) 40 MG capsule    Sig: Take 1 capsule (40 mg total) by mouth daily.    Dispense:  90 capsule    Refill:  1     Follow-up: Return in about 6 weeks (around 09/18/2020).  Sanda Linger, MD

## 2020-08-07 NOTE — Patient Instructions (Signed)

## 2020-08-09 LAB — COMPLETE METABOLIC PANEL WITH GFR
AG Ratio: 1.1 (calc) (ref 1.0–2.5)
ALT: 11 U/L (ref 6–29)
AST: 16 U/L (ref 10–30)
Albumin: 4.1 g/dL (ref 3.6–5.1)
Alkaline phosphatase (APISO): 62 U/L (ref 31–125)
BUN: 11 mg/dL (ref 7–25)
CO2: 27 mmol/L (ref 20–32)
Calcium: 9.8 mg/dL (ref 8.6–10.2)
Chloride: 100 mmol/L (ref 98–110)
Creat: 0.97 mg/dL (ref 0.50–1.10)
GFR, Est African American: 82 mL/min/{1.73_m2} (ref >=60)
GFR, Est Non African American: 71 mL/min/{1.73_m2} (ref >=60)
Globulin: 3.6 g/dL (calc) (ref 1.9–3.7)
Glucose, Bld: 109 mg/dL — ABNORMAL HIGH (ref 65–99)
Potassium: 3.9 mmol/L (ref 3.5–5.3)
Sodium: 140 mmol/L (ref 135–146)
Total Bilirubin: 0.4 mg/dL (ref 0.2–1.2)
Total Protein: 7.7 g/dL (ref 6.1–8.1)

## 2020-08-09 LAB — MAGNESIUM: Magnesium: 1.8 mg/dL (ref 1.5–2.5)

## 2020-08-11 ENCOUNTER — Encounter: Payer: Self-pay | Admitting: Internal Medicine

## 2020-08-11 ENCOUNTER — Other Ambulatory Visit: Payer: Self-pay | Admitting: Internal Medicine

## 2020-08-11 DIAGNOSIS — K644 Residual hemorrhoidal skin tags: Secondary | ICD-10-CM

## 2020-08-11 MED ORDER — HYDROCORTISONE (PERIANAL) 2.5 % EX CREA: 1.0000 "application " | TOPICAL_CREAM | Freq: Two times a day (BID) | CUTANEOUS | 0 refills | Status: DC

## 2020-09-18 ENCOUNTER — Ambulatory Visit: Payer: No Typology Code available for payment source | Admitting: Internal Medicine

## 2020-09-25 ENCOUNTER — Ambulatory Visit: Payer: No Typology Code available for payment source | Admitting: Internal Medicine

## 2020-10-01 ENCOUNTER — Ambulatory Visit: Payer: No Typology Code available for payment source | Admitting: Internal Medicine

## 2020-10-08 ENCOUNTER — Other Ambulatory Visit: Payer: Self-pay

## 2020-10-08 ENCOUNTER — Ambulatory Visit: Payer: No Typology Code available for payment source | Admitting: Internal Medicine

## 2020-10-08 ENCOUNTER — Encounter: Payer: Self-pay | Admitting: Internal Medicine

## 2020-10-08 VITALS — BP 126/86 | HR 65 | Temp 98.7°F | Ht 66.0 in | Wt 247.0 lb

## 2020-10-08 DIAGNOSIS — K5904 Chronic idiopathic constipation: Secondary | ICD-10-CM | POA: Diagnosis not present

## 2020-10-08 DIAGNOSIS — I10 Essential (primary) hypertension: Secondary | ICD-10-CM

## 2020-10-08 DIAGNOSIS — Z6839 Body mass index (BMI) 39.0-39.9, adult: Secondary | ICD-10-CM

## 2020-10-08 DIAGNOSIS — L301 Dyshidrosis [pompholyx]: Secondary | ICD-10-CM

## 2020-10-08 DIAGNOSIS — L2084 Intrinsic (allergic) eczema: Secondary | ICD-10-CM

## 2020-10-08 DIAGNOSIS — L508 Other urticaria: Secondary | ICD-10-CM

## 2020-10-08 MED ORDER — METHYLPREDNISOLONE ACETATE 40 MG/ML IJ SUSP
40.0000 mg | Freq: Once | INTRAMUSCULAR | Status: AC
  Administered 2020-10-08: 40 mg via INTRAMUSCULAR

## 2020-10-08 MED ORDER — FLUOCINONIDE EMULSIFIED BASE 0.05 % EX CREA: 1.0000 "application " | TOPICAL_CREAM | Freq: Three times a day (TID) | CUTANEOUS | 2 refills | Status: DC

## 2020-10-08 MED ORDER — METHYLPREDNISOLONE ACETATE 80 MG/ML IJ SUSP
80.0000 mg | Freq: Once | INTRAMUSCULAR | Status: AC
  Administered 2020-10-08: 80 mg via INTRAMUSCULAR

## 2020-10-08 MED ORDER — DOXEPIN HCL 10 MG PO CAPS: 10.0000 mg | ORAL_CAPSULE | Freq: Every day | ORAL | 0 refills | Status: DC

## 2020-10-08 MED ORDER — MONTELUKAST SODIUM 10 MG PO TABS: 10.0000 mg | ORAL_TABLET | Freq: Every day | ORAL | 1 refills | Status: DC

## 2020-10-08 MED ORDER — PHENTERMINE HCL 37.5 MG PO CAPS: 37.5000 mg | ORAL_CAPSULE | ORAL | 2 refills | Status: DC

## 2020-10-08 MED ORDER — LINACLOTIDE 145 MCG PO CAPS: 145.0000 ug | ORAL_CAPSULE | Freq: Every day | ORAL | 1 refills | Status: DC

## 2020-10-08 NOTE — Progress Notes (Signed)
PCP ordered 120mg  of depo-medrol to be administered to the patient.

## 2020-10-08 NOTE — Patient Instructions (Signed)
Hives Hives (urticaria) are itchy, red, swollen areas on the skin. Hives can appear on any part of the body. Hives often fade within 24 hours (acute hives). Sometimes, new hives appear after old ones fade and the cycle can continue for several days or weeks (chronic hives). Hives do not spread from person to person (are not contagious). Hives come from the body's reaction to something a person is allergic to (allergen), something that causes irritation, or various other triggers. When a person is exposed to a trigger, his or her body releases a chemical (histamine) that causes redness, itching, and swelling. Hives can appear right after exposure to a trigger or hours later. What are the causes? This condition may be caused by:  Allergies to foods or ingredients.  Insect bites or stings.  Exposure to pollen or pets.  Contact with latex or chemicals.  Spending time in sunlight, heat, or cold (exposure).  Exercise.  Stress.  Certain medicines. You can also get hives from other medical conditions and treatments, such as:  Viruses, including the common cold.  Bacterial infections, such as urinary tract infections and strep throat.  Certain medicines.  Allergy shots.  Blood transfusions. Sometimes, the cause of this condition is not known (idiopathic hives). What increases the risk? You are more likely to develop this condition if you:  Are a woman.  Have food allergies, especially to citrus fruits, milk, eggs, peanuts, tree nuts, or shellfish.  Are allergic to: ? Medicines. ? Latex. ? Insects. ? Animals. ? Pollen. What are the signs or symptoms? Common symptoms of this condition include raised, itchy, red or white bumps or patches on your skin. These areas may:  Become large and swollen (welts).  Change in shape and location, quickly and repeatedly.  Be separate hives or connect over a large area of skin.  Sting or become painful.  Turn white when pressed in the  center (blanch). In severe cases, yourhands, feet, and face may also become swollen. This may occur if hives develop deeper in your skin. How is this diagnosed? This condition may be diagnosed by your symptoms, medical history, and physical exam.  Your skin, urine, or blood may be tested to find out what is causing your hives and to rule out other health issues.  Your health care provider may also remove a small sample of skin from the affected area and examine it under a microscope (biopsy). How is this treated? Treatment for this condition depends on the cause and severity of your symptoms. Your health care provider may recommend using cool, wet cloths (cool compresses) or taking cool showers to relieve itching. Treatment may include:  Medicines that help: ? Relieve itching (antihistamines). ? Reduce swelling (corticosteroids). ? Treat infection (antibiotics).  An injectable medicine (omalizumab). Your health care provider may prescribe this if you have chronic idiopathic hives and you continue to have symptoms even after treatment with antihistamines. Severe cases may require an emergency injection of adrenaline (epinephrine) to prevent a life-threatening allergic reaction (anaphylaxis). Follow these instructions at home: Medicines  Take and apply over-the-counter and prescription medicines only as told by your health care provider.  If you were prescribed an antibiotic medicine, take it as told by your health care provider. Do not stop using the antibiotic even if you start to feel better. Skin care  Apply cool compresses to the affected areas.  Do not scratch or rub your skin. General instructions  Do not take hot showers or baths. This can make itching   worse.  Do not wear tight-fitting clothing.  Use sunscreen and wear protective clothing when you are outside.  Avoid any substances that cause your hives. Keep a journal to help track what causes your hives. Write  down: ? What medicines you take. ? What you eat and drink. ? What products you use on your skin.  Keep all follow-up visits as told by your health care provider. This is important. Contact a health care provider if:  Your symptoms are not controlled with medicine.  Your joints are painful or swollen. Get help right away if:  You have a fever.  You have pain in your abdomen.  Your tongue or lips are swollen.  Your eyelids are swollen.  Your chest or throat feels tight.  You have trouble breathing or swallowing. These symptoms may represent a serious problem that is an emergency. Do not wait to see if the symptoms will go away. Get medical help right away. Call your local emergency services (911 in the [place] Do not drive yourself to the hospital. Summary  Hives (urticaria) are itchy, red, swollen areas on your skin. Hives come from the body's reaction to something a person is allergic to (allergen), something that causes irritation, or various other triggers.  Treatment for this condition depends on the cause and severity of your symptoms.  Avoid any substances that cause your hives. Keep a journal to help track what causes your hives.  Take and apply over-the-counter and prescription medicines only as told by your health care provider.  Keep all follow-up visits as told by your health care provider. This is important. This information is not intended to replace advice given to you by your health care provider. Make sure you discuss any questions you have with your health care provider. Document Revised: 05/31/2018 Document Reviewed: 05/31/2018 Elsevier Patient Education  2020 [employer]  

## 2020-10-08 NOTE — Progress Notes (Signed)
Subjective:  Patient ID: Faith Sellers, female    DOB: [birth year]  Age: 44 y.o. MRN: [id number]  CC: Rash  This visit occurred during the SARS-CoV-2 public health emergency.  Safety protocols were in place, including screening questions prior to the visit, additional usage of staff PPE, and extensive cleaning of exam room while observing appropriate contact time as indicated for disinfecting solutions.    HPI Faith Sellers presents for f/up - She complains of a 3-week history of itchy rash over her lower extremities.  The rash over the legs is static but she has had an itchy rash on her upper extremities and torso that comes and goes.  She is not aware of any recent exposures, new medications, or vaccines.  The rash has not responded to over-the-counter antihistamines like Zyrtec.  She has photos that show a rash on her upper extremities that looks like urticaria.  Outpatient Medications Prior to Visit  Medication Sig Dispense Refill  . ALPRAZolam (XANAX) 1 MG tablet Take 1 mg by mouth at bedtime as needed for anxiety.    . Cholecalciferol 50 MCG (2000 UT) TABS Take 1 tablet (2,000 Units total) by mouth daily. 90 tablet 1  . esomeprazole (NEXIUM) 40 MG capsule Take 1 capsule (40 mg total) by mouth daily. 90 capsule 1  . hydrocortisone (ANUSOL-HC) 2.5 % rectal cream Place 1 application rectally 2 (two) times daily. 30 g 0  . nebivolol (BYSTOLIC) 10 MG tablet Take 1 tablet (10 mg total) by mouth daily. 90 tablet 0  . Nitroglycerin 0.4 % OINT Apply thin layer over bottom twice daily. 30 g 0  . triamterene-hydrochlorothiazide (DYAZIDE) 37.5-25 MG capsule Take 1 each (1 capsule total) by mouth daily. 90 capsule 0  . ibuprofen (ADVIL) 800 MG tablet Take 1 tablet (800 mg total) by mouth every 8 (eight) hours as needed. 30 tablet 1  . linaclotide (LINZESS) 145 MCG CAPS capsule Take 1 capsule (145 mcg total) by mouth daily before breakfast. 90 capsule 1   No facility-administered medications  prior to visit.    ROS Review of Systems  Constitutional: Positive for unexpected weight change (wt gain). Negative for diaphoresis, fatigue and fever.  HENT: Negative.  Negative for facial swelling, nosebleeds, sinus pressure, sore throat and trouble swallowing.   Eyes: Negative.  Negative for pain, itching and visual disturbance.  Respiratory: Negative for cough, chest tightness, shortness of breath and wheezing.   Cardiovascular: Negative for chest pain, palpitations and leg swelling.  Gastrointestinal: Positive for constipation. Negative for abdominal pain, diarrhea, nausea and vomiting.       Her constipation has responded well to Linzess.  She would like to continue taking it.  Endocrine: Negative.   Genitourinary: Negative.  Negative for difficulty urinating and dysuria.  Musculoskeletal: Negative for arthralgias and myalgias.  Skin: Positive for rash. Negative for color change.  Neurological: Negative.  Negative for dizziness and weakness.  Hematological: Negative for adenopathy. Does not bruise/bleed easily.  Psychiatric/Behavioral: Negative.     Objective:  BP 126/86   Pulse 65   Temp 98.7 F (37.1 C) (Oral)   Ht 5\' 6"  (1.676 m)   Wt 247 lb (112 kg)   SpO2 98%   BMI 39.87 kg/m   BP Readings from Last 3 Encounters:  10/08/20 126/86  08/07/20 (!) 178/106  03/26/20 (!) 142/86    Wt Readings from Last 3 Encounters:  10/08/20 247 lb (112 kg)  08/07/20 237 lb (107.5 kg)  03/26/20 236  lb (107 kg)    Physical Exam Vitals reviewed.  HENT:     Mouth/Throat:     Mouth: Mucous membranes are moist.  Eyes:     General: No scleral icterus.    Conjunctiva/sclera: Conjunctivae normal.  Cardiovascular:     Rate and Rhythm: Normal rate and regular rhythm.     Heart sounds: No murmur heard.   Pulmonary:     Effort: Pulmonary effort is normal.     Breath sounds: No stridor. No wheezing, rhonchi or rales.  Abdominal:     General: Abdomen is protuberant. Bowel sounds  are normal.     Palpations: Abdomen is soft. There is no hepatomegaly, splenomegaly or mass.  Musculoskeletal:        General: Normal range of motion.     Cervical back: Neck supple.     Right lower leg: No edema.     Left lower leg: No edema.  Lymphadenopathy:     Cervical: No cervical adenopathy.  Skin:    General: Skin is warm and dry.     Findings: Erythema and rash present. No ecchymosis. Rash is papular and scaling. Rash is not nodular, purpuric, pustular, urticarial or vesicular.     Comments: Over both lower extremities, anteriorly, there are large swaths of coalesced erythematous, scaly papules. See photo.  Neurological:     General: No focal deficit present.     Mental Status: She is alert.  Psychiatric:        Mood and Affect: Mood normal.        Behavior: Behavior normal.     Lab Results  Component Value Date   WBC 9.1 02/07/2020   HGB 13.9 02/07/2020   HCT 41.3 02/07/2020   PLT 273.0 02/07/2020   GLUCOSE 109 (H) 08/07/2020   CHOL 216 (H) 02/07/2020   TRIG 134.0 02/07/2020   HDL 40.70 02/07/2020   LDLDIRECT 151.2 07/19/2013   LDLCALC 148 (H) 02/07/2020   ALT 11 08/07/2020   AST 16 08/07/2020   NA 140 08/07/2020   K 3.9 08/07/2020   CL 100 08/07/2020   CREATININE 0.97 08/07/2020   BUN 11 08/07/2020   CO2 27 08/07/2020   TSH 1.88 02/07/2020   INR 0.96 08/28/2011   HGBA1C 5.4 01/17/2019    No results found.  Assessment & Plan:   Faith Sellers was seen today for rash.  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  Will treat with an injection of Depo-Medrol.  I have also recommended that she take a potent antihistamine and use a potent topical steroid. -     fluocinonide-emollient (LIDEX-E) 0.05 % cream; Apply 1 application topically 3 (three) times daily. -     Discontinue: doxepin (SINEQUAN) 10 MG capsule; Take 1 capsule (10 mg total) by mouth at bedtime. -     methylPREDNISolone acetate (DEPO-MEDROL)  injection 40 mg -     methylPREDNISolone acetate (DEPO-MEDROL) injection 80 mg -     levocetirizine (XYZAL) 5 MG tablet; Take 2 tablets (10 mg total) by mouth every evening.  Urticaria, acute -     Discontinue: doxepin (SINEQUAN) 10 MG capsule; Take 1 capsule (10 mg total) by mouth at bedtime. -     montelukast (SINGULAIR) 10 MG tablet; Take 1 tablet (10 mg total) by mouth at bedtime. -     methylPREDNISolone acetate (DEPO-MEDROL) injection 40 mg -     methylPREDNISolone acetate (DEPO-MEDROL) injection 80 mg -  levocetirizine (XYZAL) 5 MG tablet; Take 2 tablets (10 mg total) by mouth every evening.  Dyshidrotic eczema -     fluocinonide-emollient (LIDEX-E) 0.05 % cream; Apply 1 application topically 3 (three) times daily.  Chronic idiopathic constipation- She has responded well to linaclotide.  Will continue. -     linaclotide (LINZESS) 145 MCG CAPS capsule; Take 1 capsule (145 mcg total) by mouth daily before breakfast.  Essential hypertension, benign- Her blood pressure is adequately well controlled.  Class 2 severe obesity due to excess calories with serious comorbidity and body mass index (BMI) of 39.0 to 39.9 in adult [facility])- She would like to take phentermine to help reduce her caloric intake and to help her lose weight. -     phentermine 37.5 MG capsule; Take 1 capsule (37.5 mg total) by mouth every morning.   I have discontinued Faith Sellers's ibuprofen and doxepin. I am also having her start on fluocinonide-emollient, montelukast, phentermine, and levocetirizine. Additionally, I am having her maintain her ALPRAZolam, Cholecalciferol, Nitroglycerin, triamterene-hydrochlorothiazide, nebivolol, esomeprazole, hydrocortisone, and linaclotide. We administered methylPREDNISolone acetate and methylPREDNISolone acetate.  Meds ordered this encounter  Medications  . fluocinonide-emollient (LIDEX-E) 0.05 % cream    Sig: Apply 1 application topically 3 (three) times daily.    Dispense:  120  g    Refill:  2  . linaclotide (LINZESS) 145 MCG CAPS capsule    Sig: Take 1 capsule (145 mcg total) by mouth daily before breakfast.    Dispense:  90 capsule    Refill:  1    Requested drug refills are authorized, however, the patient needs further evaluation and/or laboratory testing before further refills are given. Ask her to make an appointment for this.  Marland Kitchen DISCONTD: doxepin (SINEQUAN) 10 MG capsule    Sig: Take 1 capsule (10 mg total) by mouth at bedtime.    Dispense:  90 capsule    Refill:  0  . montelukast (SINGULAIR) 10 MG tablet    Sig: Take 1 tablet (10 mg total) by mouth at bedtime.    Dispense:  90 tablet    Refill:  1  . methylPREDNISolone acetate (DEPO-MEDROL) injection 40 mg  . methylPREDNISolone acetate (DEPO-MEDROL) injection 80 mg  . phentermine 37.5 MG capsule    Sig: Take 1 capsule (37.5 mg total) by mouth every morning.    Dispense:  30 capsule    Refill:  2  . levocetirizine (XYZAL) 5 MG tablet    Sig: Take 2 tablets (10 mg total) by mouth every evening.    Dispense:  180 tablet    Refill:  1   I spent 50 minutes in preparing to see the patient by review of recent labs, imaging and procedures, obtaining and reviewing separately obtained history, communicating with the patient and family or caregiver, ordering medications, tests or procedures, and documenting clinical information in the EHR including the differential Dx, treatment, and any further evaluation and other management of 1. Intrinsic eczema 2. Urticaria, acute 3. Dyshidrotic eczema 4. Chronic idiopathic constipation 5. Essential hypertension, benign 6. Class 2 severe obesity due to excess calories with serious comorbidity and body mass index (BMI) of 39.0 to 39.9 in adult [facility])      Follow-up: Return in about 3 months (around 01/08/2021).  Sanda Linger, MD

## 2020-10-09 ENCOUNTER — Encounter: Payer: Self-pay | Admitting: Internal Medicine

## 2020-10-09 MED ORDER — LEVOCETIRIZINE DIHYDROCHLORIDE 5 MG PO TABS: 10.0000 mg | ORAL_TABLET | Freq: Every evening | ORAL | 1 refills | Status: DC

## 2020-10-09 NOTE — Telephone Encounter (Signed)
Please advise 

## 2020-10-14 ENCOUNTER — Encounter: Payer: Self-pay | Admitting: Internal Medicine

## 2020-11-09 ENCOUNTER — Other Ambulatory Visit: Payer: Self-pay | Admitting: Internal Medicine

## 2020-11-09 DIAGNOSIS — K644 Residual hemorrhoidal skin tags: Secondary | ICD-10-CM

## 2020-11-10 MED ORDER — HYDROCORTISONE (PERIANAL) 2.5 % EX CREA: 1.0000 "application " | TOPICAL_CREAM | Freq: Two times a day (BID) | CUTANEOUS | 2 refills | Status: DC

## 2020-11-19 ENCOUNTER — Other Ambulatory Visit: Payer: Self-pay | Admitting: Internal Medicine

## 2020-11-19 DIAGNOSIS — I10 Essential (primary) hypertension: Secondary | ICD-10-CM

## 2020-12-04 ENCOUNTER — Encounter: Payer: Self-pay | Admitting: Internal Medicine

## 2020-12-04 ENCOUNTER — Other Ambulatory Visit: Payer: Self-pay

## 2020-12-04 ENCOUNTER — Ambulatory Visit: Payer: No Typology Code available for payment source | Admitting: Internal Medicine

## 2020-12-04 VITALS — BP 136/86 | HR 60 | Temp 97.9°F | Resp 16 | Ht 66.0 in | Wt 231.0 lb

## 2020-12-04 DIAGNOSIS — I10 Essential (primary) hypertension: Secondary | ICD-10-CM

## 2020-12-04 DIAGNOSIS — L508 Other urticaria: Secondary | ICD-10-CM

## 2020-12-04 MED ORDER — METHYLPREDNISOLONE ACETATE 80 MG/ML IJ SUSP
120.0000 mg | Freq: Once | INTRAMUSCULAR | Status: AC
  Administered 2020-12-04: 120 mg via INTRAMUSCULAR

## 2020-12-04 NOTE — Patient Instructions (Signed)
Angioedema Angioedema is the sudden swelling of tissue in the body. Angioedema can affect any part of the body, but it most often affects the deeper parts of the skin, causing red, itchy patches (hives) to appear over the affected area. It often begins during the night and is found in the morning. Depending on the cause, angioedema may happen:  Only once.  Several times. It may come back in unpredictable patterns.  Repeatedly for several years. Over time, it may gradually stop coming back. Angioedema can be life-threatening if it affects the air passages that you breathe through. What are the causes? This condition may be caused by:  Foods, such as milk, eggs, shellfish, wheat, or nuts.  Certain medicines, such as ACE inhibitors, antibiotics, nonsteroidal anti-inflammatory drugs, birth control pills, or dyes used in X-rays.  Insect stings.  Infections. Angioedema can be inherited, and episodes can be triggered by:  Mild injury.  Dental work.  Surgery.  Stress.  Sudden changes in temperature.  Exercise. In some cases, the cause of this condition is not known. What are the signs or symptoms? Symptoms of this condition depend on where the swelling happens. Symptoms may include:  Swollen skin.  Red, itchy patches of skin (hives).  Redness in the affected area.  Pain in the affected area.  Swollen lips or tongue.  Wheezing.  Breathing problems.  If your internal organs are involved, symptoms may also include:  Nausea.  Abdominal pain.  Vomiting.  Difficulty swallowing.  Difficulty passing urine. How is this diagnosed? This condition may be diagnosed based on:  An exam of the affected area.  Your medical history.  Whether anyone in your family has had this condition before.  A review of any medicines you have been taking.  Tests, including: ? Allergy skin tests to see if the condition was caused by an allergic reaction. ? Blood tests to see if the  condition was caused by a gene. ? Tests to check for underlying diseases that could cause the condition. How is this treated? Treatment for this condition depends on the cause. It may involve any of the following:  If something triggered the condition, making changes to keep it from triggering the condition again.  If the condition affects your breathing, having tubes placed in your airway to keep it open.  Taking medicines to treat symptoms or prevent future episodes. These may include: ? Antihistamines. ? Epinephrine injections. ? Steroids. If your condition is severe, you may need to be treated at the hospital. Angioedema usually gets better in 24-48 hours. Follow these instructions at home:  Take over-the-counter and prescription medicines only as told by your health care provider.  If you were given medicines for emergency allergy treatment, always carry them with you.  Wear a medical bracelet as told by your health care provider.  If something triggers your condition, avoid the trigger, if possible.  If your condition is inherited and you are thinking about having children, talk to your health care provider. It is important to discuss the risks of passing on the condition to your children. Contact a health care provider if:  You have repeated episodes of angioedema.  Episodes of angioedema start to happen more often than they used to, even after you take steps to prevent them.  You have episodes of angioedema that are more severe than they have been before, even after you take steps to prevent them.  You are thinking about having children. Get help right away if:  You   have severe swelling of your mouth, tongue, or lips.  You have trouble breathing.  You have trouble swallowing.  You faint. This information is not intended to replace advice given to you by your health care provider. Make sure you discuss any questions you have with your health care provider. Document  Revised: 10/28/2017 Document Reviewed: 05/25/2016 Elsevier Patient Education  2020 [employer]  

## 2020-12-04 NOTE — Progress Notes (Signed)
Subjective:  Patient ID: Faith Sellers, female    DOB: [birth year]  Age: 45 y.o. MRN: [id number]  CC: Rash and Hypertension  This visit occurred during the SARS-CoV-2 public health emergency.  Safety protocols were in place, including screening questions prior to the visit, additional usage of staff PPE, and extensive cleaning of exam room while observing appropriate contact time as indicated for disinfecting solutions.    HPI Keena Dinse presents for f/up -  1.  She tells me her blood pressure has been well controlled.  2. She complains that the hives returned about one week ago. The lesions are very itchy. The symptoms have not responded to the current regimen. She is intermittently taking ibuprofen.  Outpatient Medications Prior to Visit  Medication Sig Dispense Refill   ALPRAZolam (XANAX) 1 MG tablet Take 1 mg by mouth at bedtime as needed for anxiety.     Cholecalciferol 50 MCG (2000 UT) TABS Take 1 tablet (2,000 Units total) by mouth daily. 90 tablet 1   esomeprazole (NEXIUM) 40 MG capsule Take 1 capsule (40 mg total) by mouth daily. 90 capsule 1   fluocinonide-emollient (LIDEX-E) 0.05 % cream Apply 1 application topically 3 (three) times daily. 120 g 2   hydrocortisone (ANUSOL-HC) 2.5 % rectal cream Place 1 application rectally 2 (two) times daily. 30 g 2   levocetirizine (XYZAL) 5 MG tablet Take 2 tablets (10 mg total) by mouth every evening. 180 tablet 1   linaclotide (LINZESS) 145 MCG CAPS capsule Take 1 capsule (145 mcg total) by mouth daily before breakfast. 90 capsule 1   montelukast (SINGULAIR) 10 MG tablet Take 1 tablet (10 mg total) by mouth at bedtime. 90 tablet 1   nebivolol (BYSTOLIC) 10 MG tablet Take 1 tablet (10 mg total) by mouth daily. 90 tablet 0   Nitroglycerin 0.4 % OINT Apply thin layer over bottom twice daily. 30 g 0   phentermine 37.5 MG capsule Take 1 capsule (37.5 mg total) by mouth every morning. 30 capsule 2   triamterene-hydrochlorothiazide  (DYAZIDE) 37.5-25 MG capsule TAKE 1 CAPSULE BY MOUTH DAILY 90 capsule 0   No facility-administered medications prior to visit.    ROS Review of Systems  Constitutional: Negative for appetite change, diaphoresis, fatigue and unexpected weight change.  HENT: Negative.  Negative for facial swelling and sore throat.   Eyes: Negative for visual disturbance.  Respiratory: Negative for chest tightness, shortness of breath and wheezing.   Cardiovascular: Negative for chest pain, palpitations and leg swelling.  Gastrointestinal: Negative for abdominal pain, blood in stool, constipation, diarrhea, nausea and vomiting.  Endocrine: Negative.   Genitourinary: Negative.  Negative for difficulty urinating.  Musculoskeletal: Negative.  Negative for arthralgias and myalgias.  Skin: Positive for rash. Negative for color change.  Neurological: Negative.  Negative for dizziness, weakness, light-headedness and headaches.  Hematological: Negative for adenopathy. Does not bruise/bleed easily.  Psychiatric/Behavioral: Negative.     Objective:  BP 136/86    Pulse 60    Temp 97.9 F (36.6 C) (Oral)    Resp 16    Ht 5\' 6"  (1.676 m)    Wt 231 lb (104.8 kg)    SpO2 98%    BMI 37.28 kg/m   BP Readings from Last 3 Encounters:  12/04/20 136/86  10/08/20 126/86  08/07/20 (!) 178/106    Wt Readings from Last 3 Encounters:  12/04/20 231 lb (104.8 kg)  10/08/20 247 lb (112 kg)  08/07/20 237 lb (107.5 kg)    Physical Exam  Vitals reviewed.  Constitutional:      General: She is not in acute distress.    Appearance: Normal appearance. She is not ill-appearing, toxic-appearing or diaphoretic.  HENT:     Nose: Nose normal.     Mouth/Throat:     Mouth: Mucous membranes are moist.  Eyes:     General: No scleral icterus.    Conjunctiva/sclera: Conjunctivae normal.  Cardiovascular:     Rate and Rhythm: Normal rate and regular rhythm.     Heart sounds: No murmur heard.   Pulmonary:     Effort: Pulmonary  effort is normal.     Breath sounds: No stridor. No wheezing, rhonchi or rales.  Abdominal:     General: Abdomen is protuberant. Bowel sounds are normal. There is no distension.     Palpations: Abdomen is soft. There is no hepatomegaly, splenomegaly or mass.  Musculoskeletal:        General: No swelling or tenderness. Normal range of motion.     Cervical back: Neck supple.  Lymphadenopathy:     Cervical: No cervical adenopathy.  Skin:    General: Skin is warm and dry.     Coloration: Skin is not jaundiced.     Findings: Rash present. Rash is urticarial.     Comments: There are urticarial wheals over her torso and extremities  Neurological:     General: No focal deficit present.     Mental Status: She is alert and oriented to person, place, and time. Mental status is at baseline.  Psychiatric:        Mood and Affect: Mood normal.        Behavior: Behavior normal.        Thought Content: Thought content normal.        Judgment: Judgment normal.     Lab Results  Component Value Date   WBC 9.1 02/07/2020   HGB 13.9 02/07/2020   HCT 41.3 02/07/2020   PLT 273.0 02/07/2020   GLUCOSE 109 (H) 08/07/2020   CHOL 216 (H) 02/07/2020   TRIG 134.0 02/07/2020   HDL 40.70 02/07/2020   LDLDIRECT 151.2 07/19/2013   LDLCALC 148 (H) 02/07/2020   ALT 11 08/07/2020   AST 16 08/07/2020   NA 140 08/07/2020   K 3.9 08/07/2020   CL 100 08/07/2020   CREATININE 0.97 08/07/2020   BUN 11 08/07/2020   CO2 27 08/07/2020   TSH 1.88 02/07/2020   INR 0.96 08/28/2011   HGBA1C 5.4 01/17/2019    No results found.  Assessment & Plan:   Bailei was seen today for rash and hypertension.  Diagnoses and all orders for this visit:  Urticaria, acute- She did not tolerate doxepin. She was given an injection of depo-medrol. Will continue xyzal and singulair. I have asked her to stop taking ibuprofen. Will screen for alpha-gal syndrome and refer to allergy. -     Ambulatory referral to Allergy -      methylPREDNISolone acetate (DEPO-MEDROL) injection 120 mg -     CBC with Differential/Platelet; Future -     Sedimentation rate; Future -     Alpha-Gal Panel; Future  Essential hypertension, benign- Her BP is well controlled. -     CBC with Differential/Platelet; Future   I am having Hilda Blades maintain her ALPRAZolam, Cholecalciferol, Nitroglycerin, nebivolol, esomeprazole, fluocinonide-emollient, linaclotide, montelukast, phentermine, levocetirizine, hydrocortisone, and triamterene-hydrochlorothiazide. We administered methylPREDNISolone acetate.  Meds ordered this encounter  Medications   methylPREDNISolone acetate (DEPO-MEDROL) injection 120 mg  Follow-up: Return in about 3 months (around 03/04/2021).  Scarlette Calico, MD

## 2020-12-05 ENCOUNTER — Encounter: Payer: Self-pay | Admitting: Internal Medicine

## 2020-12-14 ENCOUNTER — Other Ambulatory Visit: Payer: Self-pay | Admitting: Internal Medicine

## 2020-12-14 DIAGNOSIS — K644 Residual hemorrhoidal skin tags: Secondary | ICD-10-CM

## 2021-01-06 NOTE — Progress Notes (Deleted)
New Patient Note  RE: Faith Sellers MRN: [id number] DOB: 12-09-1975 Date of Office Visit: 01/07/2021  Referring provider: Etta Grandchild, MD Primary care provider: Etta Grandchild, MD  Chief Complaint: No chief complaint on file.  History of Present Illness: I had the pleasure of seeing Faith Sellers for initial evaluation at the [facility] on 01/06/2021. She is a 45 y.o. female, who is referred here by Etta Grandchild, MD for the evaluation of urticaria.  Rash started about *** ago. Mainly occurs on her ***. Describes them as ***. Individual rashes lasts about ***. No ecchymosis upon resolution. Associated symptoms include: ***. Suspected triggers are ***. Denies any *** fevers, chills, changes in medications, foods, personal care products or recent infections. She has tried the following therapies: *** with *** benefit. Systemic steroids ***. Currently on ***.  Previous work up includes: ***. Previous history of rash/hives: ***. Patient is up to date with the following cancer screening tests: ***.  12/04/2020 PCP visit: "2. She complains that the hives returned about one week ago. The lesions are very itchy. The symptoms have not responded to the current regimen. She is intermittently taking ibuprofen. Urticaria, acute- She did not tolerate doxepin. She was given an injection of depo-medrol. Will continue xyzal and singulair. I have asked her to stop taking ibuprofen. Will screen for alpha-gal syndrome and refer to allergy. -     Ambulatory referral to Allergy -     methylPREDNISolone acetate (DEPO-MEDROL) injection 120 mg -     CBC with Differential/Platelet; Future -     Sedimentation rate; Future -     Alpha-Gal Panel; Future"  Assessment and Plan: Faith Sellers is a 45 y.o. female with: No problem-specific Assessment & Plan notes found for this encounter.  No follow-ups on file.  No orders of the defined types were placed in this encounter.  Lab Orders  No laboratory  test(s) ordered today    Other allergy screening: Asthma: {Blank single:19197::"yes","no"} Rhino conjunctivitis: {Blank single:19197::"yes","no"} Food allergy: {Blank single:19197::"yes","no"} Medication allergy: {Blank single:19197::"yes","no"} Hymenoptera allergy: {Blank single:19197::"yes","no"} Urticaria: {Blank single:19197::"yes","no"} Eczema:{Blank single:19197::"yes","no"} History of recurrent infections suggestive of immunodeficency: {Blank single:19197::"yes","no"}  Diagnostics: Spirometry:  Tracings reviewed. Her effort: {Blank single:19197::"Good reproducible efforts.","It was hard to get consistent efforts and there is a question as to whether this reflects a maximal maneuver.","Poor effort, data can not be interpreted."} FVC: ***L FEV1: ***L, ***% predicted FEV1/FVC ratio: ***% Interpretation: {Blank single:19197::"Spirometry consistent with mild obstructive disease","Spirometry consistent with moderate obstructive disease","Spirometry consistent with severe obstructive disease","Spirometry consistent with possible restrictive disease","Spirometry consistent with mixed obstructive and restrictive disease","Spirometry uninterpretable due to technique","Spirometry consistent with normal pattern","No overt abnormalities noted given today's efforts"}.  Please see scanned spirometry results for details.  Skin Testing: {Blank single:19197::"Select foods","Environmental allergy panel","Environmental allergy panel and select foods","Food allergy panel","None","Deferred due to recent antihistamines use"}. Positive test to: ***. Negative test to: ***.  Results discussed with patient/family.   Past Medical History: Patient Active Problem List   Diagnosis Date Noted  . Intrinsic eczema 10/08/2020  . Urticaria, acute 10/08/2020  . Class 2 severe obesity due to excess calories with serious comorbidity and body mass index (BMI) of 39.0 to 39.9 in adult [facility]) 10/08/2020  .  Gastroesophageal reflux disease without esophagitis 08/07/2020  . Chronic idiopathic constipation 08/07/2020  . Vitamin D deficiency 02/08/2020  . Hypokalemia 12/16/2015  . Iron deficiency anemia 09/11/2015  . Routine general medical examination at a health care facility 07/19/2013  . Obesity,  Class II, BMI 35-39.9, with comorbidity 05/24/2013  . Depression with anxiety 12/01/2012  . Dyshidrotic eczema 12/01/2012  . Other abnormal glucose 03/03/2012  . Pure hypercholesterolemia 03/03/2012  . GERD (gastroesophageal reflux disease) 11/15/2011  . Essential hypertension, benign 11/15/2011  . Insomnia 11/15/2011   Past Medical History:  Diagnosis Date  . Anxiety   . Diverticulitis   . Gastric ulcer   . GERD (gastroesophageal reflux disease)   . Hypercholesteremia   . Hypertension    Past Surgical History: No past surgical history on file. Medication List:  Current Outpatient Medications  Medication Sig Dispense Refill  . ALPRAZolam (XANAX) 1 MG tablet Take 1 mg by mouth at bedtime as needed for anxiety.    . Cholecalciferol 50 MCG (2000 UT) TABS Take 1 tablet (2,000 Units total) by mouth daily. 90 tablet 1  . esomeprazole (NEXIUM) 40 MG capsule Take 1 capsule (40 mg total) by mouth daily. 90 capsule 1  . fluocinonide-emollient (LIDEX-E) 0.05 % cream Apply 1 application topically 3 (three) times daily. 120 g 2  . hydrocortisone (ANUSOL-HC) 2.5 % rectal cream APPLY RECTALLY TO THE AFFECTED AREA TWICE DAILY 30 g 2  . levocetirizine (XYZAL) 5 MG tablet Take 2 tablets (10 mg total) by mouth every evening. 180 tablet 1  . linaclotide (LINZESS) 145 MCG CAPS capsule Take 1 capsule (145 mcg total) by mouth daily before breakfast. 90 capsule 1  . montelukast (SINGULAIR) 10 MG tablet Take 1 tablet (10 mg total) by mouth at bedtime. 90 tablet 1  . nebivolol (BYSTOLIC) 10 MG tablet Take 1 tablet (10 mg total) by mouth daily. 90 tablet 0  . Nitroglycerin 0.4 % OINT Apply thin layer over bottom  twice daily. 30 g 0  . phentermine 37.5 MG capsule Take 1 capsule (37.5 mg total) by mouth every morning. 30 capsule 2  . triamterene-hydrochlorothiazide (DYAZIDE) 37.5-25 MG capsule TAKE 1 CAPSULE BY MOUTH DAILY 90 capsule 0   No current facility-administered medications for this visit.   Allergies: No Known Allergies Social History: Social History   Socioeconomic History  . Marital status: Single    Spouse name: Not on file  . Number of children: Not on file  . Years of education: Not on file  . Highest education level: Not on file  Occupational History  . Not on file  Tobacco Use  . Smoking status: Never Smoker  . Smokeless tobacco: Never Used  Vaping Use  . Vaping Use: Never used  Substance and Sexual Activity  . Alcohol use: Not Currently    Alcohol/week: 0.0 standard drinks    Comment: occasional  . Drug use: No  . Sexual activity: Yes    Partners: Male    Birth control/protection: Condom    Comment: 1st intercourse 45 yo-fewer than 5 partners  Other Topics Concern  . Not on file  Social History Narrative  . Not on file   Social Determinants of Health   Financial Resource Strain: Not on file  Food Insecurity: Not on file  Transportation Needs: Not on file  Physical Activity: Not on file  Stress: Not on file  Social Connections: Not on file   Lives in a ***. Smoking: *** Occupation: ***  Environmental HistorySurveyor, minerals in the house: Copywriter, advertising in the family room: {Blank single:19197::"yes","no"} Carpet in the bedroom: {Blank single:19197::"yes","no"} Heating: {Blank single:19197::"electric","gas","heat pump"} Cooling: {Blank single:19197::"central","window","heat pump"} Pet: {Blank single:19197::"yes ***","no"}  Family History: Family History  Adopted: Yes  Problem Relation Age of Onset  .  Hypertension Sister   . Thyroid disease Sister   . COPD Neg Hx   . Cancer Neg Hx   . Early death Neg Hx   . Heart  disease Neg Hx   . Hyperlipidemia Neg Hx   . Kidney disease Neg Hx   . Stroke Neg Hx    Problem                               Relation Asthma                                   *** Eczema                                *** Food allergy                          *** Allergic rhino conjunctivitis     ***  Review of Systems  Constitutional: Negative for appetite change, chills, fever and unexpected weight change.  HENT: Negative for congestion and rhinorrhea.   Eyes: Negative for itching.  Respiratory: Negative for cough, chest tightness, shortness of breath and wheezing.   Cardiovascular: Negative for chest pain.  Gastrointestinal: Negative for abdominal pain.  Genitourinary: Negative for difficulty urinating.  Skin: Negative for rash.  Neurological: Negative for headaches.   Objective: There were no vitals taken for this visit. There is no height or weight on file to calculate BMI. Physical Exam Vitals and nursing note reviewed.  Constitutional:      Appearance: Normal appearance. She is well-developed.  HENT:     Head: Normocephalic and atraumatic.     Right Ear: External ear normal.     Left Ear: External ear normal.     Nose: Nose normal.     Mouth/Throat:     Mouth: Mucous membranes are moist.     Pharynx: Oropharynx is clear.  Eyes:     Conjunctiva/sclera: Conjunctivae normal.  Cardiovascular:     Rate and Rhythm: Normal rate and regular rhythm.     Heart sounds: Normal heart sounds. No murmur heard. No friction rub. No gallop.   Pulmonary:     Effort: Pulmonary effort is normal.     Breath sounds: Normal breath sounds. No wheezing, rhonchi or rales.  Abdominal:     Palpations: Abdomen is soft.  Musculoskeletal:     Cervical back: Neck supple.  Skin:    General: Skin is warm.     Findings: No rash.  Neurological:     Mental Status: She is alert and oriented to person, place, and time.  Psychiatric:        Behavior: Behavior normal.    The plan was reviewed  with the patient/family, and all questions/concerned were addressed.  It was my pleasure to see Faith Sellers today and participate in her care. Please feel free to contact me with any questions or concerns.  Sincerely,  Wyline Mood, DO Allergy & Immunology  [facility] office: [phone] [facility] office: [phone]

## 2021-01-07 ENCOUNTER — Ambulatory Visit: Payer: No Typology Code available for payment source | Admitting: Allergy

## 2021-02-27 ENCOUNTER — Other Ambulatory Visit: Payer: Self-pay | Admitting: Internal Medicine

## 2021-02-27 DIAGNOSIS — I10 Essential (primary) hypertension: Secondary | ICD-10-CM

## 2021-03-16 ENCOUNTER — Other Ambulatory Visit: Payer: Self-pay | Admitting: Internal Medicine

## 2021-03-16 DIAGNOSIS — K5904 Chronic idiopathic constipation: Secondary | ICD-10-CM

## 2021-03-31 NOTE — Progress Notes (Deleted)
New Patient Note  RE: Ronelle Michie MRN: [id number] DOB: [birth year] Date of Office Visit: 04/01/2021  Consult requested by: Etta Grandchild, MD Primary care provider: Etta Grandchild, MD  Chief Complaint: No chief complaint on file.  History of Present Illness: I had the pleasure of seeing Kimi Bordeau for initial evaluation at the [facility] on 03/31/2021. She is a 45 y.o. female, who is referred here by Etta Grandchild, MD for the evaluation of hives.  Rash started about *** ago. Mainly occurs on her ***. Describes them as ***. Individual rashes lasts about ***. No ecchymosis upon resolution. Associated symptoms include: ***. Suspected triggers are ***. Denies any *** fevers, chills, changes in medications, foods, personal care products or recent infections. She has tried the following therapies: *** with *** benefit. Systemic steroids ***. Currently on ***.  Previous work up includes: ***. Previous history of rash/hives: ***. Patient is up to date with the following cancer screening tests: ***.  12/04/2020 PCP visit: "Urticaria, acute- She did not tolerate doxepin. She was given an injection of depo-medrol. Will continue xyzal and singulair. I have asked her to stop taking ibuprofen. Will screen for alpha-gal syndrome and refer to allergy. -     Ambulatory referral to Allergy -     methylPREDNISolone acetate (DEPO-MEDROL) injection 120 mg -     CBC with Differential/Platelet; Future -     Sedimentation rate; Future -     Alpha-Gal Panel; Future"  Assessment and Plan: Gilberta is a 45 y.o. female with: No problem-specific Assessment & Plan notes found for this encounter.  No follow-ups on file.  No orders of the defined types were placed in this encounter.  Lab Orders  No laboratory test(s) ordered today    Other allergy screening: Asthma: {Blank single:19197::"yes","no"} Rhino conjunctivitis: {Blank single:19197::"yes","no"} Food allergy: {Blank  single:19197::"yes","no"} Medication allergy: {Blank single:19197::"yes","no"} Hymenoptera allergy: {Blank single:19197::"yes","no"} Urticaria: {Blank single:19197::"yes","no"} Eczema:{Blank single:19197::"yes","no"} History of recurrent infections suggestive of immunodeficency: {Blank single:19197::"yes","no"}  Diagnostics: Spirometry:  Tracings reviewed. Her effort: {Blank single:19197::"Good reproducible efforts.","It was hard to get consistent efforts and there is a question as to whether this reflects a maximal maneuver.","Poor effort, data can not be interpreted."} FVC: ***L FEV1: ***L, ***% predicted FEV1/FVC ratio: ***% Interpretation: {Blank single:19197::"Spirometry consistent with mild obstructive disease","Spirometry consistent with moderate obstructive disease","Spirometry consistent with severe obstructive disease","Spirometry consistent with possible restrictive disease","Spirometry consistent with mixed obstructive and restrictive disease","Spirometry uninterpretable due to technique","Spirometry consistent with normal pattern","No overt abnormalities noted given today's efforts"}.  Please see scanned spirometry results for details.  Skin Testing: {Blank single:19197::"Select foods","Environmental allergy panel","Environmental allergy panel and select foods","Food allergy panel","None","Deferred due to recent antihistamines use"}. Positive test to: ***. Negative test to: ***.  Results discussed with patient/family.   Past Medical History: Patient Active Problem List   Diagnosis Date Noted  . Intrinsic eczema 10/08/2020  . Urticaria, acute 10/08/2020  . Class 2 severe obesity due to excess calories with serious comorbidity and body mass index (BMI) of 39.0 to 39.9 in adult [facility]) 10/08/2020  . Gastroesophageal reflux disease without esophagitis 08/07/2020  . Chronic idiopathic constipation 08/07/2020  . Vitamin D deficiency 02/08/2020  . Hypokalemia 12/16/2015  . Iron  deficiency anemia 09/11/2015  . Routine general medical examination at a health care facility 07/19/2013  . Obesity, Class II, BMI 35-39.9, with comorbidity 05/24/2013  . Depression with anxiety 12/01/2012  . Dyshidrotic eczema 12/01/2012  . Other abnormal glucose 03/03/2012  . Pure hypercholesterolemia 03/03/2012  .  GERD (gastroesophageal reflux disease) 11/15/2011  . Essential hypertension, benign 11/15/2011  . Insomnia 11/15/2011   Past Medical History:  Diagnosis Date  . Anxiety   . Diverticulitis   . Gastric ulcer   . GERD (gastroesophageal reflux disease)   . Hypercholesteremia   . Hypertension    Past Surgical History: No past surgical history on file. Medication List:  Current Outpatient Medications  Medication Sig Dispense Refill  . ALPRAZolam (XANAX) 1 MG tablet Take 1 mg by mouth at bedtime as needed for anxiety.    . Cholecalciferol 50 MCG (2000 UT) TABS Take 1 tablet (2,000 Units total) by mouth daily. 90 tablet 1  . esomeprazole (NEXIUM) 40 MG capsule Take 1 capsule (40 mg total) by mouth daily. 90 capsule 1  . fluocinonide-emollient (LIDEX-E) 0.05 % cream Apply 1 application topically 3 (three) times daily. 120 g 2  . hydrocortisone (ANUSOL-HC) 2.5 % rectal cream APPLY RECTALLY TO THE AFFECTED AREA TWICE DAILY 30 g 2  . levocetirizine (XYZAL) 5 MG tablet Take 2 tablets (10 mg total) by mouth every evening. 180 tablet 1  . LINZESS 145 MCG CAPS capsule TAKE 1 CAPSULE(145 MCG) BY MOUTH DAILY BEFORE BREAKFAST 90 capsule 1  . montelukast (SINGULAIR) 10 MG tablet Take 1 tablet (10 mg total) by mouth at bedtime. 90 tablet 1  . nebivolol (BYSTOLIC) 10 MG tablet Take 1 tablet (10 mg total) by mouth daily. 90 tablet 0  . Nitroglycerin 0.4 % OINT Apply thin layer over bottom twice daily. 30 g 0  . phentermine 37.5 MG capsule Take 1 capsule (37.5 mg total) by mouth every morning. 30 capsule 2  . triamterene-hydrochlorothiazide (DYAZIDE) 37.5-25 MG capsule TAKE 1 CAPSULE BY  MOUTH DAILY 90 capsule 0   No current facility-administered medications for this visit.   Allergies: No Known Allergies Social History: Social History   Socioeconomic History  . Marital status: Single    Spouse name: Not on file  . Number of children: Not on file  . Years of education: Not on file  . Highest education level: Not on file  Occupational History  . Not on file  Tobacco Use  . Smoking status: Never Smoker  . Smokeless tobacco: Never Used  Vaping Use  . Vaping Use: Never used  Substance and Sexual Activity  . Alcohol use: Not Currently    Alcohol/week: 0.0 standard drinks    Comment: occasional  . Drug use: No  . Sexual activity: Yes    Partners: Male    Birth control/protection: Condom    Comment: 1st intercourse 45 yo-fewer than 5 partners  Other Topics Concern  . Not on file  Social History Narrative  . Not on file   Social Determinants of Health   Financial Resource Strain: Not on file  Food Insecurity: Not on file  Transportation Needs: Not on file  Physical Activity: Not on file  Stress: Not on file  Social Connections: Not on file   Lives in a ***. Smoking: *** Occupation: ***  Environmental HistorySurveyor, minerals in the house: Copywriter, advertising in the family room: {Blank single:19197::"yes","no"} Carpet in the bedroom: {Blank single:19197::"yes","no"} Heating: {Blank single:19197::"electric","gas","heat pump"} Cooling: {Blank single:19197::"central","window","heat pump"} Pet: {Blank single:19197::"yes ***","no"}  Family History: Family History  Adopted: Yes  Problem Relation Age of Onset  . Hypertension Sister   . Thyroid disease Sister   . COPD Neg Hx   . Cancer Neg Hx   . Early death Neg Hx   . Heart disease  Neg Hx   . Hyperlipidemia Neg Hx   . Kidney disease Neg Hx   . Stroke Neg Hx    Problem                               Relation Asthma                                   *** Eczema                                 *** Food allergy                          *** Allergic rhino conjunctivitis     ***  Review of Systems  Constitutional: Negative for appetite change, chills, fever and unexpected weight change.  HENT: Negative for congestion and rhinorrhea.   Eyes: Negative for itching.  Respiratory: Negative for cough, chest tightness, shortness of breath and wheezing.   Cardiovascular: Negative for chest pain.  Gastrointestinal: Negative for abdominal pain.  Genitourinary: Negative for difficulty urinating.  Skin: Negative for rash.  Neurological: Negative for headaches.   Objective: There were no vitals taken for this visit. There is no height or weight on file to calculate BMI. Physical Exam Vitals and nursing note reviewed.  Constitutional:      Appearance: Normal appearance. She is well-developed.  HENT:     Head: Normocephalic and atraumatic.     Right Ear: External ear normal.     Left Ear: External ear normal.     Nose: Nose normal.     Mouth/Throat:     Mouth: Mucous membranes are moist.     Pharynx: Oropharynx is clear.  Eyes:     Conjunctiva/sclera: Conjunctivae normal.  Cardiovascular:     Rate and Rhythm: Normal rate and regular rhythm.     Heart sounds: Normal heart sounds. No murmur heard. No friction rub. No gallop.   Pulmonary:     Effort: Pulmonary effort is normal.     Breath sounds: Normal breath sounds. No wheezing, rhonchi or rales.  Abdominal:     Palpations: Abdomen is soft.  Musculoskeletal:     Cervical back: Neck supple.  Skin:    General: Skin is warm.     Findings: No rash.  Neurological:     Mental Status: She is alert and oriented to person, place, and time.  Psychiatric:        Behavior: Behavior normal.    The plan was reviewed with the patient/family, and all questions/concerned were addressed.  It was my pleasure to see Ishana today and participate in her care. Please feel free to contact me with any questions or  concerns.  Sincerely,  Wyline Mood, DO Allergy & Immunology  [facility] office: [phone] [facility] office: [phone]

## 2021-04-01 ENCOUNTER — Ambulatory Visit: Payer: No Typology Code available for payment source | Admitting: Allergy

## 2021-04-29 ENCOUNTER — Telehealth: Payer: Self-pay | Admitting: Internal Medicine

## 2021-04-29 NOTE — Telephone Encounter (Signed)
Patient is requesting a referral to [facility] for weight management. Please advise   [facility]: [address], [address], [place], [place] [address]  Phone: [phone]

## 2021-05-01 ENCOUNTER — Other Ambulatory Visit: Payer: Self-pay | Admitting: Internal Medicine

## 2021-05-04 LAB — HM MAMMOGRAPHY: HM Mammogram: NORMAL (ref 0–4)

## 2021-05-23 ENCOUNTER — Other Ambulatory Visit: Payer: Self-pay | Admitting: Internal Medicine

## 2021-05-23 DIAGNOSIS — I10 Essential (primary) hypertension: Secondary | ICD-10-CM

## 2021-06-03 ENCOUNTER — Ambulatory Visit (INDEPENDENT_AMBULATORY_CARE_PROVIDER_SITE_OTHER): Payer: No Typology Code available for payment source | Admitting: Internal Medicine

## 2021-06-03 ENCOUNTER — Encounter: Payer: Self-pay | Admitting: Internal Medicine

## 2021-06-03 ENCOUNTER — Other Ambulatory Visit: Payer: Self-pay

## 2021-06-03 VITALS — BP 142/86 | HR 58 | Temp 98.1°F | Resp 16 | Ht 66.0 in | Wt 244.0 lb

## 2021-06-03 DIAGNOSIS — R001 Bradycardia, unspecified: Secondary | ICD-10-CM

## 2021-06-03 DIAGNOSIS — R7309 Other abnormal glucose: Secondary | ICD-10-CM | POA: Diagnosis not present

## 2021-06-03 DIAGNOSIS — Z860100 Personal history of colon polyps, unspecified: Secondary | ICD-10-CM

## 2021-06-03 DIAGNOSIS — E559 Vitamin D deficiency, unspecified: Secondary | ICD-10-CM | POA: Diagnosis not present

## 2021-06-03 DIAGNOSIS — K219 Gastro-esophageal reflux disease without esophagitis: Secondary | ICD-10-CM

## 2021-06-03 DIAGNOSIS — K5904 Chronic idiopathic constipation: Secondary | ICD-10-CM

## 2021-06-03 DIAGNOSIS — N926 Irregular menstruation, unspecified: Secondary | ICD-10-CM

## 2021-06-03 DIAGNOSIS — D500 Iron deficiency anemia secondary to blood loss (chronic): Secondary | ICD-10-CM

## 2021-06-03 DIAGNOSIS — Z0000 Encounter for general adult medical examination without abnormal findings: Secondary | ICD-10-CM

## 2021-06-03 DIAGNOSIS — I10 Essential (primary) hypertension: Secondary | ICD-10-CM | POA: Diagnosis not present

## 2021-06-03 LAB — LIPID PANEL
Cholesterol: 217 mg/dL — ABNORMAL HIGH (ref 0–200)
HDL: 38.1 mg/dL — ABNORMAL LOW (ref >39.00)
LDL Cholesterol: 154 mg/dL — ABNORMAL HIGH (ref 0–99)
NonHDL: 178.49
Total CHOL/HDL Ratio: 6
Triglycerides: 120 mg/dL (ref 0.0–149.0)
VLDL: 24 mg/dL (ref 0.0–40.0)

## 2021-06-03 LAB — HEPATIC FUNCTION PANEL
ALT: 11 U/L (ref 0–35)
AST: 15 U/L (ref 0–37)
Albumin: 4 g/dL (ref 3.5–5.2)
Alkaline Phosphatase: 54 U/L (ref 39–117)
Bilirubin, Direct: 0.1 mg/dL (ref 0.0–0.3)
Total Bilirubin: 0.6 mg/dL (ref 0.2–1.2)
Total Protein: 8 g/dL (ref 6.0–8.3)

## 2021-06-03 LAB — BASIC METABOLIC PANEL
BUN: 14 mg/dL (ref 6–23)
CO2: 29 mEq/L (ref 19–32)
Calcium: 9.6 mg/dL (ref 8.4–10.5)
Chloride: 101 mEq/L (ref 96–112)
Creatinine, Ser: 0.86 mg/dL (ref 0.40–1.20)
GFR: 81.45 mL/min (ref >60.00)
Glucose, Bld: 97 mg/dL (ref 70–99)
Potassium: 4.1 mEq/L (ref 3.5–5.1)
Sodium: 137 mEq/L (ref 135–145)

## 2021-06-03 LAB — CBC WITH DIFFERENTIAL/PLATELET
Basophils Absolute: 0 10*3/uL (ref 0.0–0.1)
Basophils Relative: 0.4 % (ref 0.0–3.0)
Eosinophils Absolute: 0.2 10*3/uL (ref 0.0–0.7)
Eosinophils Relative: 2.7 % (ref 0.0–5.0)
HCT: 38.1 % (ref 36.0–46.0)
Hemoglobin: 12.8 g/dL (ref 12.0–15.0)
Lymphocytes Relative: 34.9 % (ref 12.0–46.0)
Lymphs Abs: 2.4 10*3/uL (ref 0.7–4.0)
MCHC: 33.7 g/dL (ref 30.0–36.0)
MCV: 82 fl (ref 78.0–100.0)
Monocytes Absolute: 0.5 10*3/uL (ref 0.1–1.0)
Monocytes Relative: 7.6 % (ref 3.0–12.0)
Neutro Abs: 3.7 10*3/uL (ref 1.4–7.7)
Neutrophils Relative %: 54.4 % (ref 43.0–77.0)
Platelets: 247 10*3/uL (ref 150.0–400.0)
RBC: 4.65 Mil/uL (ref 3.87–5.11)
RDW: 14.6 % (ref 11.5–15.5)
WBC: 6.8 10*3/uL (ref 4.0–10.5)

## 2021-06-03 LAB — VITAMIN D 25 HYDROXY (VIT D DEFICIENCY, FRACTURES): VITD: 47 ng/mL (ref 30.00–100.00)

## 2021-06-03 LAB — LUTEINIZING HORMONE: LH: 3.27 m[IU]/mL

## 2021-06-03 LAB — HEMOGLOBIN A1C: Hgb A1c MFr Bld: 6.1 % (ref 4.6–6.5)

## 2021-06-03 LAB — FOLLICLE STIMULATING HORMONE: FSH: 7.2 m[IU]/mL

## 2021-06-03 LAB — TSH: TSH: 1.4 u[IU]/mL (ref 0.35–5.50)

## 2021-06-03 MED ORDER — SEMAGLUTIDE-WEIGHT MANAGEMENT 0.25 MG/0.5ML ~~LOC~~ SOAJ: 0.2500 mg | SUBCUTANEOUS | 0 refills | Status: DC

## 2021-06-03 MED ORDER — LINACLOTIDE 145 MCG PO CAPS: ORAL_CAPSULE | ORAL | 1 refills | Status: DC

## 2021-06-03 MED ORDER — ESOMEPRAZOLE MAGNESIUM 40 MG PO CPDR: 40.0000 mg | DELAYED_RELEASE_CAPSULE | Freq: Every day | ORAL | 1 refills | Status: AC

## 2021-06-03 MED ORDER — INSULIN PEN NEEDLE 32G X 6 MM MISC: 1.0000 | 0 refills | Status: DC

## 2021-06-03 MED ORDER — NEBIVOLOL HCL 10 MG PO TABS: ORAL_TABLET | ORAL | 1 refills | Status: DC

## 2021-06-03 NOTE — Telephone Encounter (Signed)
Key: [id number]

## 2021-06-03 NOTE — Progress Notes (Signed)
Polyps   Subjective:  Patient ID: Faith Sellers, female    DOB: [birth year]  Age: 45 y.o. MRN: [id number]  CC: Annual Exam, Hypertension, and Anemia  This visit occurred during the SARS-CoV-2 public health emergency.  Safety protocols were in place, including screening questions prior to the visit, additional usage of staff PPE, and extensive cleaning of exam room while observing appropriate contact time as indicated for disinfecting solutions.    HPI Faith Sellers presents for a CPX and f/up -   She complains of weight gain and wants to start using Wegovy.  She is not sexually active and does not intend to be sexually active in the near future.  She tells me her blood pressure has been well controlled.  She is active and denies any recent episodes of chest pain, shortness of breath, dizziness, lightheadedness, palpitations, or near syncope.  She complains that her menses has been irregular recently and she wants to be screened for menopause.  Outpatient Medications Prior to Visit  Medication Sig Dispense Refill   ALPRAZolam (XANAX) 1 MG tablet Take 1 mg by mouth at bedtime as needed for anxiety.     Cholecalciferol 50 MCG (2000 UT) TABS Take 1 tablet (2,000 Units total) by mouth daily. 90 tablet 1   fluocinonide-emollient (LIDEX-E) 0.05 % cream Apply 1 application topically 3 (three) times daily. 120 g 2   hydrocortisone (ANUSOL-HC) 2.5 % rectal cream APPLY RECTALLY TO THE AFFECTED AREA TWICE DAILY 30 g 2   levocetirizine (XYZAL) 5 MG tablet Take 2 tablets (10 mg total) by mouth every evening. 180 tablet 1   montelukast (SINGULAIR) 10 MG tablet Take 1 tablet (10 mg total) by mouth at bedtime. 90 tablet 1   Nitroglycerin 0.4 % OINT Apply thin layer over bottom twice daily. 30 g 0   esomeprazole (NEXIUM) 40 MG capsule Take 1 capsule (40 mg total) by mouth daily. 90 capsule 1   LINZESS 145 MCG CAPS capsule TAKE 1 CAPSULE(145 MCG) BY MOUTH DAILY BEFORE BREAKFAST 90 capsule 1   nebivolol (BYSTOLIC) 10  MG tablet TAKE 1 TABLET(10 MG) BY MOUTH DAILY 90 tablet 0   phentermine 37.5 MG capsule Take 1 capsule (37.5 mg total) by mouth every morning. 30 capsule 2   triamterene-hydrochlorothiazide (DYAZIDE) 37.5-25 MG capsule TAKE 1 CAPSULE BY MOUTH DAILY 90 capsule 0   No facility-administered medications prior to visit.    ROS Review of Systems  Constitutional:  Positive for unexpected weight change (wt gain). Negative for diaphoresis and fatigue.  HENT: Negative.    Eyes: Negative.  Negative for visual disturbance.  Respiratory:  Negative for cough, chest tightness, shortness of breath and wheezing.   Cardiovascular:  Negative for chest pain, palpitations and leg swelling.  Gastrointestinal:  Positive for constipation. Negative for abdominal pain, diarrhea, nausea and vomiting.       Her constipation is well controlled with Linzess.  Endocrine: Negative.   Genitourinary: Negative.  Negative for difficulty urinating.  Musculoskeletal: Negative.  Negative for myalgias.  Skin: Negative.   Neurological: Negative.  Negative for dizziness, syncope, weakness and light-headedness.  Hematological:  Negative for adenopathy. Does not bruise/bleed easily.  Psychiatric/Behavioral: Negative.     Objective:  BP (!) 142/86 (BP Location: Right Arm, Patient Position: Sitting, Cuff Size: Large)   Pulse (!) 58   Temp 98.1 F (36.7 C) (Oral)   Resp 16   Ht 5\' 6"  (1.676 m)   Wt 244 lb (110.7 kg)   SpO2 99%   BMI  39.38 kg/m   BP Readings from Last 3 Encounters:  06/03/21 (!) 142/86  12/04/20 136/86  10/08/20 126/86    Wt Readings from Last 3 Encounters:  06/03/21 244 lb (110.7 kg)  12/04/20 231 lb (104.8 kg)  10/08/20 247 lb (112 kg)    Physical Exam Vitals reviewed.  Constitutional:      Appearance: She is obese.  HENT:     Nose: Nose normal.     Mouth/Throat:     Mouth: Mucous membranes are moist.  Eyes:     General: No scleral icterus.    Conjunctiva/sclera: Conjunctivae normal.   Cardiovascular:     Rate and Rhythm: Regular rhythm. Bradycardia present.     Heart sounds: Normal heart sounds, S1 normal and S2 normal. No murmur heard.   No gallop.     Comments: EKG- Sinus bradycardia, 57 bpm Moderate LVH TWI/TWF in inferior and precordial leads Overall EKG looks better when compared to 2014 Pulmonary:     Effort: Pulmonary effort is normal.     Breath sounds: No stridor. No wheezing, rhonchi or rales.  Abdominal:     General: Abdomen is protuberant. Bowel sounds are normal. There is no distension.     Palpations: Abdomen is soft. There is no hepatomegaly, splenomegaly or mass.     Tenderness: There is no abdominal tenderness.  Musculoskeletal:        General: No swelling. Normal range of motion.     Right lower leg: No edema.     Left lower leg: No edema.  Skin:    General: Skin is warm and dry.  Neurological:     General: No focal deficit present.     Mental Status: She is alert and oriented to person, place, and time. Mental status is at baseline.  Psychiatric:        Mood and Affect: Mood normal.    Lab Results  Component Value Date   WBC 6.8 06/03/2021   HGB 12.8 06/03/2021   HCT 38.1 06/03/2021   PLT 247.0 06/03/2021   GLUCOSE 97 06/03/2021   CHOL 217 (H) 06/03/2021   TRIG 120.0 06/03/2021   HDL 38.10 (L) 06/03/2021   LDLDIRECT 151.2 07/19/2013   LDLCALC 154 (H) 06/03/2021   ALT 11 06/03/2021   AST 15 06/03/2021   NA 137 06/03/2021   K 4.1 06/03/2021   CL 101 06/03/2021   CREATININE 0.86 06/03/2021   BUN 14 06/03/2021   CO2 29 06/03/2021   TSH 1.41 06/03/2021   TSH 1.40 06/03/2021   INR 0.96 08/28/2011   HGBA1C 6.1 06/03/2021    No results found.  Assessment & Plan:   Faith Sellers was seen today for annual exam, hypertension and anemia.  Diagnoses and all orders for this visit:  Essential hypertension, benign- She has not quite achieved her blood pressure goal of 130/80.  She will continue the current antihypertensives and will  improve her lifestyle modifications.  Her labs are negative for secondary causes or endorgan damage. -     CBC with Differential/Platelet; Future -     Basic metabolic panel; Future -     TSH; Future -     Hepatic function panel; Future -     nebivolol (BYSTOLIC) 10 MG tablet; TAKE 1 TABLET(10 MG) BY MOUTH DAILY -     Thyroid Panel With TSH; Future -     EKG 12-Lead -     Thyroid Panel With TSH -     Hepatic function panel -  TSH -     Basic metabolic panel -     CBC with Differential/Platelet  Iron deficiency anemia due to chronic blood loss- Her H&H are normal now. -     CBC with Differential/Platelet; Future -     CBC with Differential/Platelet  Other abnormal glucose- She is prediabetic.  Will start a GLP-1 agonist. -     Basic metabolic panel; Future -     Hemoglobin A1c; Future -     Hemoglobin A1c -     Basic metabolic panel  Routine general medical examination at a health care facility- Exam completed, labs reviewed, vaccines reviewed, cancer screenings addressed, patient education was given. -     Lipid panel; Future -     Lipid panel  Vitamin D deficiency- Her D level is normal now. -     VITAMIN D 25 Hydroxy (Vit-D Deficiency, Fractures); Future -     VITAMIN D 25 Hydroxy (Vit-D Deficiency, Fractures)  Chronic idiopathic constipation- Her labs are negative for secondary causes.  Will continue linaclotide. -     linaclotide (LINZESS) 145 MCG CAPS capsule; TAKE 1 CAPSULE(145 MCG) BY MOUTH DAILY BEFORE BREAKFAST -     Thyroid Panel With TSH; Future -     Thyroid Panel With TSH  Gastroesophageal reflux disease without esophagitis-her symptoms are well controlled with the PPI. -     esomeprazole (NEXIUM) 40 MG capsule; Take 1 capsule (40 mg total) by mouth daily.  Morbid obesity (HCC)- Her labs are negative for secondary causes.  She is prediabetic.  Will start a GLP-1 agonist. -     Semaglutide-Weight Management 0.25 MG/0.5ML SOAJ; Inject 0.25 mg into the skin once  a week. -     Insulin Pen Needle 32G X 6 MM MISC; 1 Act by Does not apply route once a week. -     Thyroid Panel With TSH; Future -     Thyroid Panel With TSH  Irregular menses- Her FSH and LH are normal.  There is no evidence of menopause. -     Follicle stimulating hormone; Future -     Luteinizing hormone; Future -     Luteinizing hormone -     Follicle stimulating hormone  Bradycardia- She has mild bradycardia.  Her labs are negative for secondary causes.  This is likely related to the nebivolol.  She is tolerating this well so no intervention is needed.  Personal history of colonic polyps -     Ambulatory referral to Gastroenterology  I have changed Faith Sellers's Linzess to linaclotide. I am also having her start on Semaglutide-Weight Management and Insulin Pen Needle. Additionally, I am having her maintain her ALPRAZolam, Cholecalciferol, Nitroglycerin, fluocinonide-emollient, montelukast, levocetirizine, hydrocortisone, nebivolol, and esomeprazole.  Meds ordered this encounter  Medications   nebivolol (BYSTOLIC) 10 MG tablet    Sig: TAKE 1 TABLET(10 MG) BY MOUTH DAILY    Dispense:  90 tablet    Refill:  1   linaclotide (LINZESS) 145 MCG CAPS capsule    Sig: TAKE 1 CAPSULE(145 MCG) BY MOUTH DAILY BEFORE BREAKFAST    Dispense:  90 capsule    Refill:  1   esomeprazole (NEXIUM) 40 MG capsule    Sig: Take 1 capsule (40 mg total) by mouth daily.    Dispense:  90 capsule    Refill:  1   Semaglutide-Weight Management 0.25 MG/0.5ML SOAJ    Sig: Inject 0.25 mg into the skin once a week.    Dispense:  2 mL    Refill:  0   Insulin Pen Needle 32G X 6 MM MISC    Sig: 1 Act by Does not apply route once a week.    Dispense:  50 each    Refill:  0      Follow-up: Return in about 3 months (around 09/03/2021).  Sanda Lingerhomas Jarika Robben, MD

## 2021-06-03 NOTE — Patient Instructions (Signed)
Health Maintenance, Female Adopting a healthy lifestyle and getting preventive care are important in promoting health and wellness. Ask your health care provider about: The right schedule for you to have regular tests and exams. Things you can do on your own to prevent diseases and keep yourself healthy. What should I know about diet, weight, and exercise? Eat a healthy diet  Eat a diet that includes plenty of vegetables, fruits, low-fat dairy products, and lean protein. Do not eat a lot of foods that are high in solid fats, added sugars, or sodium.  Maintain a healthy weight Body mass index (BMI) is used to identify weight problems. It estimates body fat based on height and weight. Your health care provider can help determineyour BMI and help you achieve or maintain a healthy weight. Get regular exercise Get regular exercise. This is one of the most important things you can do for your health. Most adults should: Exercise for at least 150 minutes each week. The exercise should increase your heart rate and make you sweat (moderate-intensity exercise). Do strengthening exercises at least twice a week. This is in addition to the moderate-intensity exercise. Spend less time sitting. Even light physical activity can be beneficial. Watch cholesterol and blood lipids Have your blood tested for lipids and cholesterol at 46 years of age, then havethis test every 5 years. Have your cholesterol levels checked more often if: Your lipid or cholesterol levels are high. You are older than 46 years of age. You are at high risk for heart disease. What should I know about cancer screening? Depending on your health history and family history, you may need to have cancer screening at various ages. This may include screening for: Breast cancer. Cervical cancer. Colorectal cancer. Skin cancer. Lung cancer. What should I know about heart disease, diabetes, and high blood pressure? Blood pressure and heart  disease High blood pressure causes heart disease and increases the risk of stroke. This is more likely to develop in people who have high blood pressure readings, are of African descent, or are overweight. Have your blood pressure checked: Every 3-5 years if you are 18-39 years of age. Every year if you are 40 years old or older. Diabetes Have regular diabetes screenings. This checks your fasting blood sugar level. Have the screening done: Once every three years after age 40 if you are at a normal weight and have a low risk for diabetes. More often and at a younger age if you are overweight or have a high risk for diabetes. What should I know about preventing infection? Hepatitis B If you have a higher risk for hepatitis B, you should be screened for this virus. Talk with your health care provider to find out if you are at risk forhepatitis B infection. Hepatitis C Testing is recommended for: Everyone born from 1945 through 1965. Anyone with known risk factors for hepatitis C. Sexually transmitted infections (STIs) Get screened for STIs, including gonorrhea and chlamydia, if: You are sexually active and are younger than 46 years of age. You are older than 46 years of age and your health care provider tells you that you are at risk for this type of infection. Your sexual activity has changed since you were last screened, and you are at increased risk for chlamydia or gonorrhea. Ask your health care provider if you are at risk. Ask your health care provider about whether you are at high risk for HIV. Your health care provider may recommend a prescription medicine to help   prevent HIV infection. If you choose to take medicine to prevent HIV, you should first get tested for HIV. You should then be tested every 3 months for as long as you are taking the medicine. Pregnancy If you are about to stop having your period (premenopausal) and you may become pregnant, seek counseling before you get  pregnant. Take 400 to 800 micrograms (mcg) of folic acid every day if you become pregnant. Ask for birth control (contraception) if you want to prevent pregnancy. Osteoporosis and menopause Osteoporosis is a disease in which the bones lose minerals and strength with aging. This can result in bone fractures. If you are 65 years old or older, or if you are at risk for osteoporosis and fractures, ask your health care provider if you should: Be screened for bone loss. Take a calcium or vitamin D supplement to lower your risk of fractures. Be given hormone replacement therapy (HRT) to treat symptoms of menopause. Follow these instructions at home: Lifestyle Do not use any products that contain nicotine or tobacco, such as cigarettes, e-cigarettes, and chewing tobacco. If you need help quitting, ask your health care provider. Do not use street drugs. Do not share needles. Ask your health care provider for help if you need support or information about quitting drugs. Alcohol use Do not drink alcohol if: Your health care provider tells you not to drink. You are pregnant, may be pregnant, or are planning to become pregnant. If you drink alcohol: Limit how much you use to 0-1 drink a day. Limit intake if you are breastfeeding. Be aware of how much alcohol is in your drink. In the [place] one drink equals one 12 oz bottle of beer (355 mL), one 5 oz glass of wine (148 mL), or one 1 oz glass of hard liquor (44 mL). General instructions Schedule regular health, dental, and eye exams. Stay current with your vaccines. Tell your health care provider if: You often feel depressed. You have ever been abused or do not feel safe at home. Summary Adopting a healthy lifestyle and getting preventive care are important in promoting health and wellness. Follow your health care provider's instructions about healthy diet, exercising, and getting tested or screened for diseases. Follow your health care provider's  instructions on monitoring your cholesterol and blood pressure. This information is not intended to replace advice given to you by your health care provider. Make sure you discuss any questions you have with your healthcare provider. Document Revised: 11/08/2018 Document Reviewed: 11/08/2018 Elsevier Patient Education  2022 [employer]vier Inc.  

## 2021-06-04 ENCOUNTER — Other Ambulatory Visit: Payer: Self-pay | Admitting: Internal Medicine

## 2021-06-04 DIAGNOSIS — I10 Essential (primary) hypertension: Secondary | ICD-10-CM

## 2021-06-04 LAB — THYROID PANEL WITH TSH
Free Thyroxine Index: 3.2 (ref 1.4–3.8)
T3 Uptake: 25 % (ref 22–35)
T4, Total: 12.9 ug/dL — ABNORMAL HIGH (ref 5.1–11.9)
TSH: 1.41 mIU/L

## 2021-06-04 MED ORDER — TRIAMTERENE-HCTZ 37.5-25 MG PO CAPS: 1.0000 | ORAL_CAPSULE | Freq: Every day | ORAL | 1 refills | Status: DC

## 2021-06-05 NOTE — Telephone Encounter (Signed)
Approved from 06/04/2021 to 01/05/2022.

## 2021-06-08 ENCOUNTER — Ambulatory Visit: Payer: No Typology Code available for payment source | Admitting: Allergy & Immunology

## 2021-09-04 ENCOUNTER — Telehealth: Payer: Self-pay | Admitting: Internal Medicine

## 2021-09-04 ENCOUNTER — Other Ambulatory Visit: Payer: Self-pay | Admitting: Internal Medicine

## 2021-09-04 MED ORDER — SEMAGLUTIDE-WEIGHT MANAGEMENT 0.25 MG/0.5ML ~~LOC~~ SOAJ: 0.2500 mg | SUBCUTANEOUS | 0 refills | Status: DC

## 2021-09-04 NOTE — Telephone Encounter (Signed)
1.Medication Requested: Semaglutide-Weight Management 0.25 MG/0.5ML SOAJ   2. Pharmacy (Name, Street, [facility]): [facility] [id number] - [place], [place] - [address] AT [facility] [address]  Phone:  [phone] Fax:  [phone]   3. On Med List: yes  4. Last Visit with PCP: 07.06.22  5. Next visit date with PCP: n/a   Agent: Please be advised that RX refills may take up to 3 business days. We ask that you follow-up with your pharmacy.

## 2021-10-05 ENCOUNTER — Other Ambulatory Visit: Payer: Self-pay | Admitting: Internal Medicine

## 2021-10-05 DIAGNOSIS — K644 Residual hemorrhoidal skin tags: Secondary | ICD-10-CM

## 2021-11-05 ENCOUNTER — Ambulatory Visit: Payer: No Typology Code available for payment source | Admitting: Internal Medicine

## 2021-11-16 ENCOUNTER — Ambulatory Visit: Payer: No Typology Code available for payment source | Admitting: Internal Medicine

## 2021-12-01 ENCOUNTER — Ambulatory Visit: Payer: No Typology Code available for payment source | Admitting: Internal Medicine

## 2021-12-02 ENCOUNTER — Other Ambulatory Visit: Payer: Self-pay | Admitting: Internal Medicine

## 2021-12-02 DIAGNOSIS — I10 Essential (primary) hypertension: Secondary | ICD-10-CM

## 2021-12-22 ENCOUNTER — Encounter: Payer: Self-pay | Admitting: Internal Medicine

## 2021-12-22 ENCOUNTER — Ambulatory Visit (INDEPENDENT_AMBULATORY_CARE_PROVIDER_SITE_OTHER): Payer: No Typology Code available for payment source | Admitting: Internal Medicine

## 2021-12-22 ENCOUNTER — Other Ambulatory Visit: Payer: Self-pay

## 2021-12-22 VITALS — BP 136/88 | HR 74 | Temp 98.1°F | Resp 16 | Ht 66.0 in | Wt 241.0 lb

## 2021-12-22 DIAGNOSIS — Z6839 Body mass index (BMI) 39.0-39.9, adult: Secondary | ICD-10-CM

## 2021-12-22 DIAGNOSIS — K5904 Chronic idiopathic constipation: Secondary | ICD-10-CM

## 2021-12-22 DIAGNOSIS — R7303 Prediabetes: Secondary | ICD-10-CM | POA: Diagnosis not present

## 2021-12-22 DIAGNOSIS — I10 Essential (primary) hypertension: Secondary | ICD-10-CM

## 2021-12-22 DIAGNOSIS — D500 Iron deficiency anemia secondary to blood loss (chronic): Secondary | ICD-10-CM

## 2021-12-22 DIAGNOSIS — L508 Other urticaria: Secondary | ICD-10-CM

## 2021-12-22 DIAGNOSIS — Z86010 Personal history of colonic polyps: Secondary | ICD-10-CM

## 2021-12-22 LAB — CBC WITH DIFFERENTIAL/PLATELET
Basophils Absolute: 0 10*3/uL (ref 0.0–0.1)
Basophils Relative: 0.1 % (ref 0.0–3.0)
Eosinophils Absolute: 0.2 10*3/uL (ref 0.0–0.7)
Eosinophils Relative: 2.6 % (ref 0.0–5.0)
HCT: 38.7 % (ref 36.0–46.0)
Hemoglobin: 12.5 g/dL (ref 12.0–15.0)
Lymphocytes Relative: 33.5 % (ref 12.0–46.0)
Lymphs Abs: 2.3 10*3/uL (ref 0.7–4.0)
MCHC: 32.2 g/dL (ref 30.0–36.0)
MCV: 82.9 fl (ref 78.0–100.0)
Monocytes Absolute: 0.3 10*3/uL (ref 0.1–1.0)
Monocytes Relative: 4.8 % (ref 3.0–12.0)
Neutro Abs: 4.1 10*3/uL (ref 1.4–7.7)
Neutrophils Relative %: 59 % (ref 43.0–77.0)
Platelets: 320 10*3/uL (ref 150.0–400.0)
RBC: 4.67 Mil/uL (ref 3.87–5.11)
RDW: 14.4 % (ref 11.5–15.5)
WBC: 7 10*3/uL (ref 4.0–10.5)

## 2021-12-22 LAB — BASIC METABOLIC PANEL
BUN: 9 mg/dL (ref 6–23)
CO2: 30 mEq/L (ref 19–32)
Calcium: 9.3 mg/dL (ref 8.4–10.5)
Chloride: 102 mEq/L (ref 96–112)
Creatinine, Ser: 0.78 mg/dL (ref 0.40–1.20)
GFR: 91.21 mL/min (ref >60.00)
Glucose, Bld: 89 mg/dL (ref 70–99)
Potassium: 3.6 mEq/L (ref 3.5–5.1)
Sodium: 138 mEq/L (ref 135–145)

## 2021-12-22 LAB — HEMOGLOBIN A1C: Hgb A1c MFr Bld: 5.5 % (ref 4.6–6.5)

## 2021-12-22 MED ORDER — MONTELUKAST SODIUM 10 MG PO TABS: 10.0000 mg | ORAL_TABLET | Freq: Every day | ORAL | 1 refills | Status: DC

## 2021-12-22 MED ORDER — NEBIVOLOL HCL 10 MG PO TABS: ORAL_TABLET | ORAL | 1 refills | Status: DC

## 2021-12-22 MED ORDER — LINACLOTIDE 145 MCG PO CAPS: ORAL_CAPSULE | ORAL | 1 refills | Status: DC

## 2021-12-22 NOTE — Patient Instructions (Signed)

## 2021-12-22 NOTE — Progress Notes (Signed)
Subjective:  Patient ID: Faith Sellers, female    DOB: 01/09/1976  Age: 46 y.o. MRN: [id number]  CC: Hypertension  This visit occurred during the SARS-CoV-2 public health emergency.  Safety protocols were in place, including screening questions prior to the visit, additional usage of staff PPE, and extensive cleaning of exam room while observing appropriate contact time as indicated for disinfecting solutions.    HPI Faith Sellers presents for f/up -   She continues to have intermittent hives that are treated with Atarax.  She tells me her blood pressure has been well controlled.  She is active and denies chest pain, shortness of breath, dizziness, lightheadedness, edema, or diaphoresis.  She was not willing to get a flu vaccine today.  Outpatient Medications Prior to Visit  Medication Sig Dispense Refill   ALPRAZolam (XANAX) 1 MG tablet Take 1 mg by mouth at bedtime as needed for anxiety.     Cholecalciferol 50 MCG (2000 UT) TABS Take 1 tablet (2,000 Units total) by mouth daily. 90 tablet 1   esomeprazole (NEXIUM) 40 MG capsule Take 1 capsule (40 mg total) by mouth daily. 90 capsule 1   hydrocortisone (ANUSOL-HC) 2.5 % rectal cream APPLY RECTALLY TO THE AFFECTED AREA TWICE DAILY 30 g 2   Insulin Pen Needle 32G X 6 MM MISC 1 Act by Does not apply route once a week. 50 each 0   Nitroglycerin 0.4 % OINT Apply thin layer over bottom twice daily. 30 g 0   triamterene-hydrochlorothiazide (DYAZIDE) 37.5-25 MG capsule TAKE 1 CAPSULE BY MOUTH DAILY 90 capsule 0   fluocinonide-emollient (LIDEX-E) 0.05 % cream Apply 1 application topically 3 (three) times daily. 120 g 2   levocetirizine (XYZAL) 5 MG tablet Take 2 tablets (10 mg total) by mouth every evening. 180 tablet 1   linaclotide (LINZESS) 145 MCG CAPS capsule TAKE 1 CAPSULE(145 MCG) BY MOUTH DAILY BEFORE BREAKFAST 90 capsule 1   montelukast (SINGULAIR) 10 MG tablet Take 1 tablet (10 mg total) by mouth at bedtime. 90 tablet 1   nebivolol (BYSTOLIC)  10 MG tablet TAKE 1 TABLET(10 MG) BY MOUTH DAILY 90 tablet 1   Semaglutide-Weight Management 0.25 MG/0.5ML SOAJ Inject 0.25 mg into the skin once a week. 2 mL 0   No facility-administered medications prior to visit.    ROS Review of Systems  Constitutional:  Negative for chills, diaphoresis, fatigue and fever.  HENT: Negative.    Eyes: Negative.   Respiratory:  Negative for cough, chest tightness, shortness of breath and wheezing.   Cardiovascular:  Negative for chest pain, palpitations and leg swelling.  Gastrointestinal:  Positive for constipation. Negative for abdominal pain, blood in stool, diarrhea, nausea and vomiting.  Endocrine: Negative.   Genitourinary: Negative.  Negative for difficulty urinating.  Musculoskeletal: Negative.  Negative for myalgias.  Skin:  Positive for color change and rash.  Neurological:  Negative for dizziness, weakness, light-headedness and headaches.  Hematological:  Negative for adenopathy. Does not bruise/bleed easily.  Psychiatric/Behavioral: Negative.     Objective:  BP 136/88 (BP Location: Right Arm, Patient Position: Sitting, Cuff Size: Large)    Pulse 74    Temp 98.1 F (36.7 C) (Oral)    Resp 16    Ht 5\' 6"  (1.676 m)    Wt 241 lb (109.3 kg)    LMP 12/15/2021 (Exact Date)    SpO2 94%    BMI 38.90 kg/m   BP Readings from Last 3 Encounters:  12/22/21 136/88  06/03/21 (!) 142/86  12/04/20 136/86    Wt Readings from Last 3 Encounters:  12/22/21 241 lb (109.3 kg)  06/03/21 244 lb (110.7 kg)  12/04/20 231 lb (104.8 kg)    Physical Exam Vitals reviewed.  HENT:     Nose: Nose normal.     Mouth/Throat:     Mouth: Mucous membranes are moist.  Eyes:     General: No scleral icterus.    Conjunctiva/sclera: Conjunctivae normal.  Cardiovascular:     Rate and Rhythm: Normal rate and regular rhythm.     Heart sounds: No murmur heard. Pulmonary:     Effort: Pulmonary effort is normal.     Breath sounds: No stridor. No wheezing, rhonchi or  rales.  Abdominal:     General: Abdomen is flat.     Palpations: There is no mass.     Tenderness: There is no abdominal tenderness. There is no guarding.     Hernia: No hernia is present.  Musculoskeletal:     Cervical back: Neck supple.  Lymphadenopathy:     Cervical: No cervical adenopathy.  Skin:    General: Skin is warm.     Findings: Rash present. Rash is urticarial. Rash is not crusting, nodular, papular, purpuric, pustular, scaling or vesicular.     Comments: There are erythematous wheals on the dorsum of both forearms.  Neurological:     General: No focal deficit present.     Mental Status: She is alert and oriented to person, place, and time.  Psychiatric:        Mood and Affect: Mood normal.        Behavior: Behavior normal.    Lab Results  Component Value Date   WBC 7.0 12/22/2021   HGB 12.5 12/22/2021   HCT 38.7 12/22/2021   PLT 320.0 12/22/2021   GLUCOSE 89 12/22/2021   CHOL 217 (H) 06/03/2021   TRIG 120.0 06/03/2021   HDL 38.10 (L) 06/03/2021   LDLDIRECT 151.2 07/19/2013   LDLCALC 154 (H) 06/03/2021   ALT 11 06/03/2021   AST 15 06/03/2021   NA 138 12/22/2021   K 3.6 12/22/2021   CL 102 12/22/2021   CREATININE 0.78 12/22/2021   BUN 9 12/22/2021   CO2 30 12/22/2021   TSH 1.41 06/03/2021   TSH 1.40 06/03/2021   INR 0.96 08/28/2011   HGBA1C 5.5 12/22/2021    No results found.  Assessment & Plan:   [name] was seen today for hypertension.  Diagnoses and all orders for this visit:  Essential hypertension, benign- Her blood sugar is adequately well controlled. -     Basic metabolic panel; Future -     CBC with Differential/Platelet; Future -     nebivolol (BYSTOLIC) 10 MG tablet; TAKE 1 TABLET(10 MG) BY MOUTH DAILY -     CBC with Differential/Platelet -     Basic metabolic panel  Prediabetes- Her A1c is normal now. -     Basic metabolic panel; Future -     Hemoglobin A1c; Future -     Hemoglobin A1c -     Basic metabolic panel  Iron  deficiency anemia due to chronic blood loss- Her H&H are normal now. -     CBC with Differential/Platelet; Future -     CBC with Differential/Platelet  Morbid obesity (HCC)- Improvement noted with the GLP-1 agonist.  Chronic idiopathic constipation -     linaclotide (LINZESS) 145 MCG CAPS capsule; TAKE 1 CAPSULE(145 MCG) BY MOUTH DAILY BEFORE BREAKFAST  Personal history of colonic  polyps -     Ambulatory referral to Gastroenterology  Urticaria, acute-we will restart montelukast. -     montelukast (SINGULAIR) 10 MG tablet; Take 1 tablet (10 mg total) by mouth at bedtime.   I have discontinued Dejon Gasser's fluocinonide-emollient, levocetirizine, and Semaglutide-Weight Management. I am also having her maintain her ALPRAZolam, Cholecalciferol, Nitroglycerin, esomeprazole, Insulin Pen Needle, hydrocortisone, triamterene-hydrochlorothiazide, linaclotide, nebivolol, and montelukast.  Meds ordered this encounter  Medications   linaclotide (LINZESS) 145 MCG CAPS capsule    Sig: TAKE 1 CAPSULE(145 MCG) BY MOUTH DAILY BEFORE BREAKFAST    Dispense:  90 capsule    Refill:  1   nebivolol (BYSTOLIC) 10 MG tablet    Sig: TAKE 1 TABLET(10 MG) BY MOUTH DAILY    Dispense:  90 tablet    Refill:  1   montelukast (SINGULAIR) 10 MG tablet    Sig: Take 1 tablet (10 mg total) by mouth at bedtime.    Dispense:  90 tablet    Refill:  1     Follow-up: Return in about 6 months (around 06/21/2022).  Sanda Linger, MD

## 2021-12-24 ENCOUNTER — Encounter: Payer: Self-pay | Admitting: Internal Medicine

## 2021-12-25 ENCOUNTER — Other Ambulatory Visit: Payer: Self-pay | Admitting: Internal Medicine

## 2021-12-25 DIAGNOSIS — L2084 Intrinsic (allergic) eczema: Secondary | ICD-10-CM

## 2021-12-25 DIAGNOSIS — L508 Other urticaria: Secondary | ICD-10-CM

## 2021-12-25 MED ORDER — WEGOVY 0.5 MG/0.5ML ~~LOC~~ SOAJ: 0.5000 mg | SUBCUTANEOUS | 0 refills | Status: DC

## 2021-12-25 MED ORDER — HYDROXYZINE HCL 25 MG PO TABS: 25.0000 mg | ORAL_TABLET | Freq: Three times a day (TID) | ORAL | 0 refills | Status: DC | PRN

## 2021-12-25 NOTE — Addendum Note (Signed)
Addended by: Etta Grandchild on: 12/25/2021 10:22 AM   Modules accepted: Orders

## 2022-01-17 ENCOUNTER — Other Ambulatory Visit: Payer: Self-pay | Admitting: Internal Medicine

## 2022-01-17 DIAGNOSIS — R7303 Prediabetes: Secondary | ICD-10-CM

## 2022-01-18 ENCOUNTER — Other Ambulatory Visit: Payer: Self-pay | Admitting: Internal Medicine

## 2022-01-18 MED ORDER — WEGOVY 1 MG/0.5ML ~~LOC~~ SOAJ: 1.0000 mg | SUBCUTANEOUS | 0 refills | Status: DC

## 2022-01-19 ENCOUNTER — Telehealth: Payer: Self-pay

## 2022-01-19 NOTE — Telephone Encounter (Signed)
Pt calling in to request a PA for  Semaglutide-Weight Management (WEGOVY) 1 MG/0.5ML [place] advised pt that a PA is needed.  Pt CB [phone]

## 2022-01-19 NOTE — Telephone Encounter (Signed)
Key: [id number]

## 2022-01-20 ENCOUNTER — Encounter: Payer: Self-pay | Admitting: Internal Medicine

## 2022-01-25 ENCOUNTER — Telehealth: Payer: Self-pay | Admitting: Internal Medicine

## 2022-01-25 ENCOUNTER — Other Ambulatory Visit: Payer: Self-pay | Admitting: Internal Medicine

## 2022-01-25 DIAGNOSIS — R7303 Prediabetes: Secondary | ICD-10-CM

## 2022-01-25 MED ORDER — INSULIN PEN NEEDLE 32G X 6 MM MISC: 1.0000 | 0 refills | Status: DC

## 2022-01-25 MED ORDER — OZEMPIC (0.25 OR 0.5 MG/DOSE) 2 MG/1.5ML ~~LOC~~ SOPN: 0.5000 mg | PEN_INJECTOR | SUBCUTANEOUS | 0 refills | Status: DC

## 2022-01-25 NOTE — Telephone Encounter (Signed)
Pt states her insurance will no longer cover cost of Semaglutide-Weight Management (WEGOVY) 1 MG/0.5ML SOAJ  Pt states her insurance will cover ozempic and mounjaro, pt requesting provider to send a new rx for one of the covered medications with the least side effects to pharmacy on file

## 2022-02-09 NOTE — Telephone Encounter (Signed)
Appeal key [id number] ?

## 2022-02-24 ENCOUNTER — Other Ambulatory Visit: Payer: Self-pay | Admitting: Internal Medicine

## 2022-02-24 DIAGNOSIS — I10 Essential (primary) hypertension: Secondary | ICD-10-CM

## 2022-02-25 MED ORDER — TRIAMTERENE-HCTZ 37.5-25 MG PO CAPS: 1.0000 | ORAL_CAPSULE | Freq: Every day | ORAL | 0 refills | Status: DC

## 2022-03-26 ENCOUNTER — Other Ambulatory Visit: Payer: Self-pay | Admitting: Internal Medicine

## 2022-03-26 DIAGNOSIS — I10 Essential (primary) hypertension: Secondary | ICD-10-CM

## 2022-03-29 ENCOUNTER — Other Ambulatory Visit: Payer: Self-pay | Admitting: Internal Medicine

## 2022-03-29 MED ORDER — TRIAMTERENE-HCTZ 37.5-25 MG PO CAPS: 1.0000 | ORAL_CAPSULE | Freq: Every day | ORAL | 0 refills | Status: DC

## 2022-03-29 MED ORDER — WEGOVY 1.7 MG/0.75ML ~~LOC~~ SOAJ: 1.7000 mg | SUBCUTANEOUS | 0 refills | Status: DC

## 2022-04-20 ENCOUNTER — Other Ambulatory Visit: Payer: Self-pay | Admitting: Internal Medicine

## 2022-04-21 ENCOUNTER — Other Ambulatory Visit: Payer: Self-pay | Admitting: Internal Medicine

## 2022-04-21 MED ORDER — SEMAGLUTIDE-WEIGHT MANAGEMENT 2.4 MG/0.75ML ~~LOC~~ SOAJ: 2.4000 mg | SUBCUTANEOUS | 1 refills | Status: DC

## 2022-04-21 MED ORDER — WEGOVY 1.7 MG/0.75ML ~~LOC~~ SOAJ: 1.7000 mg | SUBCUTANEOUS | 0 refills | Status: DC

## 2022-06-23 ENCOUNTER — Other Ambulatory Visit: Payer: Self-pay | Admitting: Internal Medicine

## 2022-06-23 DIAGNOSIS — I10 Essential (primary) hypertension: Secondary | ICD-10-CM

## 2022-08-02 ENCOUNTER — Encounter: Payer: Self-pay | Admitting: Internal Medicine

## 2022-08-09 ENCOUNTER — Other Ambulatory Visit: Payer: Self-pay | Admitting: Internal Medicine

## 2022-08-09 DIAGNOSIS — I10 Essential (primary) hypertension: Secondary | ICD-10-CM

## 2022-08-10 ENCOUNTER — Other Ambulatory Visit: Payer: Self-pay | Admitting: Internal Medicine

## 2022-08-10 DIAGNOSIS — I10 Essential (primary) hypertension: Secondary | ICD-10-CM

## 2022-08-10 MED ORDER — TRIAMTERENE-HCTZ 37.5-25 MG PO CAPS: 1.0000 | ORAL_CAPSULE | Freq: Every day | ORAL | 0 refills | Status: DC

## 2022-08-30 ENCOUNTER — Ambulatory Visit: Payer: No Typology Code available for payment source | Admitting: Internal Medicine

## 2022-09-04 ENCOUNTER — Other Ambulatory Visit: Payer: Self-pay | Admitting: Internal Medicine

## 2022-09-04 DIAGNOSIS — I10 Essential (primary) hypertension: Secondary | ICD-10-CM

## 2022-09-04 MED ORDER — NEBIVOLOL HCL 10 MG PO TABS: 10.0000 mg | ORAL_TABLET | Freq: Every day | ORAL | 0 refills | Status: DC

## 2022-09-08 ENCOUNTER — Ambulatory Visit: Payer: No Typology Code available for payment source | Admitting: Internal Medicine

## 2022-09-14 ENCOUNTER — Encounter: Payer: Self-pay | Admitting: Internal Medicine

## 2022-09-14 ENCOUNTER — Ambulatory Visit: Payer: No Typology Code available for payment source | Admitting: Internal Medicine

## 2022-09-14 VITALS — BP 116/68 | HR 61 | Temp 98.3°F | Resp 16 | Ht 66.0 in | Wt 201.0 lb

## 2022-09-14 DIAGNOSIS — I10 Essential (primary) hypertension: Secondary | ICD-10-CM

## 2022-09-14 DIAGNOSIS — D500 Iron deficiency anemia secondary to blood loss (chronic): Secondary | ICD-10-CM | POA: Diagnosis not present

## 2022-09-14 DIAGNOSIS — L508 Other urticaria: Secondary | ICD-10-CM

## 2022-09-14 DIAGNOSIS — Z1211 Encounter for screening for malignant neoplasm of colon: Secondary | ICD-10-CM

## 2022-09-14 DIAGNOSIS — Z0000 Encounter for general adult medical examination without abnormal findings: Secondary | ICD-10-CM

## 2022-09-14 DIAGNOSIS — E7800 Pure hypercholesterolemia, unspecified: Secondary | ICD-10-CM

## 2022-09-14 DIAGNOSIS — L2084 Intrinsic (allergic) eczema: Secondary | ICD-10-CM

## 2022-09-14 LAB — CBC WITH DIFFERENTIAL/PLATELET
Basophils Absolute: 0 10*3/uL (ref 0.0–0.1)
Basophils Relative: 0.8 % (ref 0.0–3.0)
Eosinophils Absolute: 0.1 10*3/uL (ref 0.0–0.7)
Eosinophils Relative: 1.8 % (ref 0.0–5.0)
HCT: 40 % (ref 36.0–46.0)
Hemoglobin: 13 g/dL (ref 12.0–15.0)
Lymphocytes Relative: 32.9 % (ref 12.0–46.0)
Lymphs Abs: 2 10*3/uL (ref 0.7–4.0)
MCHC: 32.6 g/dL (ref 30.0–36.0)
MCV: 84 fl (ref 78.0–100.0)
Monocytes Absolute: 0.3 10*3/uL (ref 0.1–1.0)
Monocytes Relative: 5.1 % (ref 3.0–12.0)
Neutro Abs: 3.7 10*3/uL (ref 1.4–7.7)
Neutrophils Relative %: 59.4 % (ref 43.0–77.0)
Platelets: 352 10*3/uL (ref 150.0–400.0)
RBC: 4.76 Mil/uL (ref 3.87–5.11)
RDW: 14 % (ref 11.5–15.5)
WBC: 6.2 10*3/uL (ref 4.0–10.5)

## 2022-09-14 MED ORDER — HYDROXYZINE HCL 25 MG PO TABS: 25.0000 mg | ORAL_TABLET | Freq: Three times a day (TID) | ORAL | 0 refills | Status: DC | PRN

## 2022-09-14 MED ORDER — MONTELUKAST SODIUM 10 MG PO TABS: 10.0000 mg | ORAL_TABLET | Freq: Every day | ORAL | 1 refills | Status: DC

## 2022-09-14 NOTE — Patient Instructions (Signed)

## 2022-09-14 NOTE — Progress Notes (Signed)
Subjective:  Patient ID: Faith Sellers, female    DOB: May 14, 1976  Age: 46 y.o. MRN: [id number]  CC: Annual Exam, Anemia, and Hypertension   HPI Faith Sellers presents for a CPX and f/up -   She continues to complain of intermittent hives.  She denies shortness of breath, wheezing, swelling, abdominal pain, dysuria, or hematuria.  Outpatient Medications Prior to Visit  Medication Sig Dispense Refill   ALPRAZolam (XANAX) 1 MG tablet Take 1 mg by mouth at bedtime as needed for anxiety.     Cholecalciferol 50 MCG (2000 UT) TABS Take 1 tablet (2,000 Units total) by mouth daily. 90 tablet 1   esomeprazole (NEXIUM) 40 MG capsule Take 1 capsule (40 mg total) by mouth daily. 90 capsule 1   hydrocortisone (ANUSOL-HC) 2.5 % rectal cream APPLY RECTALLY TO THE AFFECTED AREA TWICE DAILY 30 g 2   Insulin Pen Needle 32G X 6 MM MISC 1 Act by Does not apply route once a week. 50 each 0   linaclotide (LINZESS) 145 MCG CAPS capsule TAKE 1 CAPSULE(145 MCG) BY MOUTH DAILY BEFORE BREAKFAST 90 capsule 1   nebivolol (BYSTOLIC) 10 MG tablet Take 1 tablet (10 mg total) by mouth daily. TAKE 1 TABLET EVERY DAY 90 tablet 0   Nitroglycerin 0.4 % OINT Apply thin layer over bottom twice daily. 30 g 0   Semaglutide-Weight Management 2.4 MG/0.75ML SOAJ Inject 2.4 mg into the skin once a week. 9 mL 1   hydrOXYzine (ATARAX) 25 MG tablet Take 1 tablet (25 mg total) by mouth every 8 (eight) hours as needed. 270 tablet 0   montelukast (SINGULAIR) 10 MG tablet Take 1 tablet (10 mg total) by mouth at bedtime. 90 tablet 1   triamterene-hydrochlorothiazide (DYAZIDE) 37.5-25 MG capsule Take 1 each (1 capsule total) by mouth daily. 90 capsule 0   No facility-administered medications prior to visit.    ROS Review of Systems  Constitutional:  Negative for chills, diaphoresis, fatigue and unexpected weight change.  HENT: Negative.    Eyes: Negative.   Respiratory:  Negative for cough, chest tightness, shortness of breath and  wheezing.   Cardiovascular:  Negative for chest pain, palpitations and leg swelling.  Gastrointestinal:  Negative for abdominal pain, diarrhea, nausea and vomiting.  Endocrine: Negative.   Genitourinary: Negative.  Negative for difficulty urinating and dysuria.  Musculoskeletal: Negative.  Negative for arthralgias.  Skin:  Positive for rash. Negative for color change.  Neurological:  Negative for dizziness, weakness and light-headedness.  Hematological:  Negative for adenopathy. Does not bruise/bleed easily.  Psychiatric/Behavioral: Negative.      Objective:  BP 116/68 (BP Location: Left Arm, Patient Position: Sitting, Cuff Size: Large)   Pulse 61   Temp 98.3 F (36.8 C) (Oral)   Resp 16   Ht 5\' 6"  (1.676 m)   Wt 201 lb (91.2 kg)   LMP 08/04/2022 (Approximate)   SpO2 98%   BMI 32.44 kg/m   BP Readings from Last 3 Encounters:  09/14/22 116/68  12/22/21 136/88  06/03/21 (!) 142/86    Wt Readings from Last 3 Encounters:  09/14/22 201 lb (91.2 kg)  12/22/21 241 lb (109.3 kg)  06/03/21 244 lb (110.7 kg)    Physical Exam Vitals reviewed.  Constitutional:      Appearance: She is not ill-appearing.  HENT:     Mouth/Throat:     Mouth: Mucous membranes are moist.  Eyes:     General: No scleral icterus.    Conjunctiva/sclera: Conjunctivae normal.  Cardiovascular:     Rate and Rhythm: Normal rate and regular rhythm.     Heart sounds: No murmur heard. Pulmonary:     Effort: Pulmonary effort is normal.     Breath sounds: No stridor. No wheezing, rhonchi or rales.  Abdominal:     General: Abdomen is flat.     Palpations: There is no mass.     Tenderness: There is no abdominal tenderness. There is no guarding.     Hernia: No hernia is present.  Musculoskeletal:     Cervical back: Neck supple.  Lymphadenopathy:     Cervical: No cervical adenopathy.  Skin:    General: Skin is warm and dry.     Findings: Rash present. No lesion. Rash is papular and urticarial. Rash is not  nodular, scaling or vesicular.     Comments: There are a few urticarial wheals on her face and torso.  On the dorsal surface of both wrist there is scaling, papules, and pigmentary alteration.  Neurological:     General: No focal deficit present.     Mental Status: She is alert.     Lab Results  Component Value Date   WBC 6.2 09/14/2022   HGB 13.0 09/14/2022   HCT 40.0 09/14/2022   PLT 352.0 09/14/2022   GLUCOSE 87 09/14/2022   CHOL 189 09/14/2022   TRIG 177.0 (H) 09/14/2022   HDL 34.10 (L) 09/14/2022   LDLDIRECT 151.2 07/19/2013   LDLCALC 119 (H) 09/14/2022   ALT 11 06/03/2021   AST 15 06/03/2021   NA 138 09/14/2022   K 3.7 09/14/2022   CL 101 09/14/2022   CREATININE 0.81 09/14/2022   BUN 14 09/14/2022   CO2 32 09/14/2022   TSH 1.41 06/03/2021   TSH 1.40 06/03/2021   INR 0.96 08/28/2011   HGBA1C 5.5 12/22/2021    No results found.  Assessment & Plan:   Maryl was seen today for annual exam, anemia and hypertension.  Diagnoses and all orders for this visit:  Routine general medical examination at a health care facility- Exam completed, labs reviewed, she refused a flu vaccine, cancer screenings addressed, patient education was given.  Iron deficiency anemia due to chronic blood loss- Her H&H are normal now. -     CBC with Differential/Platelet; Future -     CBC with Differential/Platelet  Essential hypertension, benign- Her blood pressure is overcontrolled.  Will discontinue the diuretics. -     Basic metabolic panel; Future -     Basic metabolic panel  Pure hypercholesterolemia- statin is not indicated. -     Lipid panel; Future -     Lipid panel  Urticaria, chronic -     hydrOXYzine (ATARAX) 25 MG tablet; Take 1 tablet (25 mg total) by mouth every 8 (eight) hours as needed.  Intrinsic eczema -     hydrOXYzine (ATARAX) 25 MG tablet; Take 1 tablet (25 mg total) by mouth every 8 (eight) hours as needed.  Urticaria, acute -     montelukast (SINGULAIR) 10 MG  tablet; Take 1 tablet (10 mg total) by mouth at bedtime.   I have discontinued Danique Deleo's triamterene-hydrochlorothiazide. I am also having her maintain her ALPRAZolam, Cholecalciferol, Nitroglycerin, esomeprazole, hydrocortisone, linaclotide, Insulin Pen Needle, Semaglutide-Weight Management, nebivolol, hydrOXYzine, and montelukast.  Meds ordered this encounter  Medications   hydrOXYzine (ATARAX) 25 MG tablet    Sig: Take 1 tablet (25 mg total) by mouth every 8 (eight) hours as needed.    Dispense:  270  tablet    Refill:  0   montelukast (SINGULAIR) 10 MG tablet    Sig: Take 1 tablet (10 mg total) by mouth at bedtime.    Dispense:  90 tablet    Refill:  1     Follow-up: Return in about 6 months (around 03/16/2023).  Sanda Linger, MD

## 2022-09-15 LAB — BASIC METABOLIC PANEL WITH GFR
BUN: 14 mg/dL (ref 6–23)
CO2: 32 meq/L (ref 19–32)
Calcium: 9.6 mg/dL (ref 8.4–10.5)
Chloride: 101 meq/L (ref 96–112)
Creatinine, Ser: 0.81 mg/dL (ref 0.40–1.20)
GFR: 86.73 mL/min
Glucose, Bld: 87 mg/dL (ref 70–99)
Potassium: 3.7 meq/L (ref 3.5–5.1)
Sodium: 138 meq/L (ref 135–145)

## 2022-09-15 LAB — LIPID PANEL
Cholesterol: 189 mg/dL (ref 0–200)
HDL: 34.1 mg/dL — ABNORMAL LOW
LDL Cholesterol: 119 mg/dL — ABNORMAL HIGH (ref 0–99)
NonHDL: 154.56
Total CHOL/HDL Ratio: 6
Triglycerides: 177 mg/dL — ABNORMAL HIGH (ref 0.0–149.0)
VLDL: 35.4 mg/dL (ref 0.0–40.0)

## 2022-09-15 NOTE — Assessment & Plan Note (Signed)
Estimated Creatinine Clearance: 98.8 mL/min (by C-G formula based on SCr of 0.81 mg/dL).

## 2022-09-16 ENCOUNTER — Encounter: Payer: Self-pay | Admitting: Internal Medicine

## 2022-09-17 DIAGNOSIS — L508 Other urticaria: Secondary | ICD-10-CM | POA: Insufficient documentation

## 2022-09-17 DIAGNOSIS — Z1211 Encounter for screening for malignant neoplasm of colon: Secondary | ICD-10-CM | POA: Insufficient documentation

## 2022-09-20 ENCOUNTER — Ambulatory Visit: Payer: No Typology Code available for payment source | Admitting: Internal Medicine

## 2022-09-28 NOTE — Telephone Encounter (Signed)
Key: [id number]

## 2022-11-02 ENCOUNTER — Other Ambulatory Visit: Payer: Self-pay | Admitting: Internal Medicine

## 2022-11-29 ENCOUNTER — Other Ambulatory Visit: Payer: Self-pay | Admitting: Internal Medicine

## 2022-11-29 DIAGNOSIS — I10 Essential (primary) hypertension: Secondary | ICD-10-CM

## 2022-11-29 MED ORDER — NEBIVOLOL HCL 10 MG PO TABS: 10.0000 mg | ORAL_TABLET | Freq: Every day | ORAL | 0 refills | Status: DC

## 2022-12-19 ENCOUNTER — Other Ambulatory Visit: Payer: Self-pay | Admitting: Internal Medicine

## 2022-12-19 DIAGNOSIS — K5904 Chronic idiopathic constipation: Secondary | ICD-10-CM

## 2023-01-01 ENCOUNTER — Other Ambulatory Visit: Payer: Self-pay | Admitting: Internal Medicine

## 2023-01-16 ENCOUNTER — Other Ambulatory Visit: Payer: Self-pay | Admitting: Internal Medicine

## 2023-01-16 DIAGNOSIS — K5904 Chronic idiopathic constipation: Secondary | ICD-10-CM

## 2023-02-14 ENCOUNTER — Encounter: Payer: No Typology Code available for payment source | Admitting: Internal Medicine

## 2023-03-05 ENCOUNTER — Other Ambulatory Visit: Payer: Self-pay | Admitting: Internal Medicine

## 2023-03-05 DIAGNOSIS — I10 Essential (primary) hypertension: Secondary | ICD-10-CM

## 2023-03-08 ENCOUNTER — Encounter: Payer: No Typology Code available for payment source | Admitting: Internal Medicine

## 2023-03-09 ENCOUNTER — Other Ambulatory Visit: Payer: Self-pay | Admitting: Internal Medicine

## 2023-03-11 ENCOUNTER — Encounter: Payer: Self-pay | Admitting: Internal Medicine

## 2023-03-14 ENCOUNTER — Other Ambulatory Visit: Payer: Self-pay | Admitting: Internal Medicine

## 2023-03-14 MED ORDER — WEGOVY 1.7 MG/0.75ML ~~LOC~~ SOAJ: 1.7000 mg | SUBCUTANEOUS | 0 refills | Status: DC

## 2023-03-28 ENCOUNTER — Encounter: Payer: No Typology Code available for payment source | Admitting: Internal Medicine

## 2023-04-12 ENCOUNTER — Encounter: Payer: No Typology Code available for payment source | Admitting: Internal Medicine

## 2023-04-21 ENCOUNTER — Encounter: Payer: No Typology Code available for payment source | Admitting: Internal Medicine

## 2023-05-18 ENCOUNTER — Encounter: Payer: Self-pay | Admitting: Internal Medicine

## 2023-05-18 ENCOUNTER — Ambulatory Visit: Payer: No Typology Code available for payment source | Admitting: Internal Medicine

## 2023-05-18 ENCOUNTER — Other Ambulatory Visit: Payer: Self-pay | Admitting: Internal Medicine

## 2023-05-18 VITALS — BP 128/84 | HR 66 | Temp 98.4°F | Resp 16 | Ht 66.0 in | Wt 205.0 lb

## 2023-05-18 DIAGNOSIS — R7303 Prediabetes: Secondary | ICD-10-CM

## 2023-05-18 DIAGNOSIS — K5904 Chronic idiopathic constipation: Secondary | ICD-10-CM | POA: Diagnosis not present

## 2023-05-18 DIAGNOSIS — E7800 Pure hypercholesterolemia, unspecified: Secondary | ICD-10-CM

## 2023-05-18 DIAGNOSIS — E559 Vitamin D deficiency, unspecified: Secondary | ICD-10-CM

## 2023-05-18 DIAGNOSIS — Z0000 Encounter for general adult medical examination without abnormal findings: Secondary | ICD-10-CM | POA: Diagnosis not present

## 2023-05-18 DIAGNOSIS — Z86010 Personal history of colonic polyps: Secondary | ICD-10-CM

## 2023-05-18 DIAGNOSIS — I10 Essential (primary) hypertension: Secondary | ICD-10-CM | POA: Diagnosis not present

## 2023-05-18 DIAGNOSIS — L508 Other urticaria: Secondary | ICD-10-CM

## 2023-05-18 LAB — MAGNESIUM: Magnesium: 1.8 mg/dL (ref 1.5–2.5)

## 2023-05-18 LAB — HEPATIC FUNCTION PANEL
ALT: 5 U/L (ref 0–35)
AST: 13 U/L (ref 0–37)
Albumin: 3.7 g/dL (ref 3.5–5.2)
Alkaline Phosphatase: 56 U/L (ref 39–117)
Bilirubin, Direct: 0.1 mg/dL (ref 0.0–0.3)
Total Bilirubin: 0.6 mg/dL (ref 0.2–1.2)
Total Protein: 7 g/dL (ref 6.0–8.3)

## 2023-05-18 LAB — BASIC METABOLIC PANEL
BUN: 11 mg/dL (ref 6–23)
CO2: 27 mEq/L (ref 19–32)
Calcium: 9 mg/dL (ref 8.4–10.5)
Chloride: 104 mEq/L (ref 96–112)
Creatinine, Ser: 0.8 mg/dL (ref 0.40–1.20)
GFR: 87.62 mL/min (ref >60.00)
Glucose, Bld: 91 mg/dL (ref 70–99)
Potassium: 3.9 mEq/L (ref 3.5–5.1)
Sodium: 138 mEq/L (ref 135–145)

## 2023-05-18 LAB — CBC WITH DIFFERENTIAL/PLATELET
Basophils Absolute: 0 10*3/uL (ref 0.0–0.1)
Basophils Relative: 0.3 % (ref 0.0–3.0)
Eosinophils Absolute: 0.1 10*3/uL (ref 0.0–0.7)
Eosinophils Relative: 1.8 % (ref 0.0–5.0)
HCT: 40.6 % (ref 36.0–46.0)
Hemoglobin: 13.1 g/dL (ref 12.0–15.0)
Lymphocytes Relative: 33.3 % (ref 12.0–46.0)
Lymphs Abs: 1.7 10*3/uL (ref 0.7–4.0)
MCHC: 32.2 g/dL (ref 30.0–36.0)
MCV: 83.8 fl (ref 78.0–100.0)
Monocytes Absolute: 0.2 10*3/uL (ref 0.1–1.0)
Monocytes Relative: 3.4 % (ref 3.0–12.0)
Neutro Abs: 3.1 10*3/uL (ref 1.4–7.7)
Neutrophils Relative %: 61.2 % (ref 43.0–77.0)
Platelets: 314 10*3/uL (ref 150.0–400.0)
RBC: 4.84 Mil/uL (ref 3.87–5.11)
RDW: 14.1 % (ref 11.5–15.5)
WBC: 5 10*3/uL (ref 4.0–10.5)

## 2023-05-18 LAB — TSH: TSH: 1.43 u[IU]/mL (ref 0.35–5.50)

## 2023-05-18 LAB — HEMOGLOBIN A1C: Hgb A1c MFr Bld: 5.3 % (ref 4.6–6.5)

## 2023-05-18 MED ORDER — CHOLECALCIFEROL 50 MCG (2000 UT) PO TABS
1.0000 | ORAL_TABLET | Freq: Every day | ORAL | 1 refills | Status: AC
Start: 2023-05-18 — End: ?

## 2023-05-18 MED ORDER — ZEPBOUND 5 MG/0.5ML ~~LOC~~ SOAJ
5.0000 mg | SUBCUTANEOUS | 0 refills | Status: DC
Start: 2023-05-18 — End: 2023-06-15

## 2023-05-18 MED ORDER — NEBIVOLOL HCL 10 MG PO TABS: 10.0000 mg | ORAL_TABLET | Freq: Every day | ORAL | 1 refills | Status: DC

## 2023-05-18 NOTE — Patient Instructions (Signed)

## 2023-05-18 NOTE — Progress Notes (Signed)
Subjective:  Patient ID: Faith Sellers, female    DOB: 11-24-1976  Age: 47 y.o. MRN: [id number]  CC: Annual Exam, Hypertension, and Rash   HPI Faith Sellers presents for a CPX and f/up ----  Discussed the use of AI scribe software for clinical note transcription with the patient, who gave verbal consent to proceed.  History of Present Illness   The patient, with a history of chronic hives, has been managing symptoms with Benadryl, which provides temporary relief. They have noticed a correlation between the consumption of certain foods, particularly sweets and food dyes, and the worsening of their symptoms. The hives primarily affect the eyes, especially upon waking, but resolve with Benadryl.  They have been on [facility] for weight management but have recently experienced an increase in appetite and subsequent weight gain. Despite the weight gain, they report no change in endurance or overall well-being. They have expressed willingness to switch to Zepbound if recommended.  The patient also has a history of psychiatric treatment following the death of their mother. They have been prescribed Alprazolam for rest, but only take it as needed, which is infrequent. They have no reported symptoms of high blood pressure, chest pain, shortness of breath, dizziness, lightheadedness, or symptoms of colon cancer. They have had a colonoscopy years ago and recently received a Cologuard test. They are due for a mammogram and have a Pap smear scheduled.  The patient denies any symptoms of heartburn, indigestion, or difficulty swallowing since starting [facility] and changing their diet.       Outpatient Medications Prior to Visit  Medication Sig Dispense Refill   ALPRAZolam (XANAX) 1 MG tablet Take 1 mg by mouth at bedtime as needed for anxiety.     esomeprazole (NEXIUM) 40 MG capsule Take 1 capsule (40 mg total) by mouth daily. 90 capsule 1   hydrocortisone (ANUSOL-HC) 2.5 % rectal cream APPLY RECTALLY TO THE  AFFECTED AREA TWICE DAILY 30 g 2   hydrOXYzine (ATARAX) 25 MG tablet Take 1 tablet (25 mg total) by mouth every 8 (eight) hours as needed. 270 tablet 0   Insulin Pen Needle 32G X 6 MM MISC 1 Act by Does not apply route once a week. 50 each 0   linaclotide (LINZESS) 145 MCG CAPS capsule Take 1 capsule (145 mcg total) by mouth daily before breakfast. 90 capsule 0   montelukast (SINGULAIR) 10 MG tablet Take 1 tablet (10 mg total) by mouth at bedtime. 90 tablet 1   Nitroglycerin 0.4 % OINT Apply thin layer over bottom twice daily. 30 g 0   Cholecalciferol 50 MCG (2000 UT) TABS Take 1 tablet (2,000 Units total) by mouth daily. 90 tablet 1   nebivolol (BYSTOLIC) 10 MG tablet TAKE 1 TABLET EVERY DAY 90 tablet 0   Semaglutide-Weight Management (WEGOVY) 1.7 MG/0.75ML SOAJ Inject 1.7 mg into the skin once a week. 9 mL 0   No facility-administered medications prior to visit.    ROS Review of Systems  Constitutional: Negative.  Negative for chills, diaphoresis, fatigue and fever.  HENT: Negative.    Eyes: Negative.   Respiratory: Negative.  Negative for cough, chest tightness, shortness of breath and wheezing.   Cardiovascular:  Negative for chest pain, palpitations and leg swelling.  Gastrointestinal:  Negative for abdominal pain, diarrhea, nausea and vomiting.  Genitourinary: Negative.  Negative for difficulty urinating.  Musculoskeletal: Negative.  Negative for joint swelling and myalgias.  Skin:  Positive for rash. Negative for color change.  Neurological:  Negative for  dizziness, seizures, weakness, numbness and headaches.  Hematological:  Negative for adenopathy. Does not bruise/bleed easily.  Psychiatric/Behavioral: Negative.      Objective:  BP 128/84 (BP Location: Right Arm, Patient Position: Sitting, Cuff Size: Normal)   Pulse 66   Temp 98.4 F (36.9 C) (Oral)   Resp 16   Ht 5\' 6"  (1.676 m)   Wt 205 lb (93 kg)   LMP 02/01/2023 (Approximate)   SpO2 94%   BMI 33.09 kg/m   BP  Readings from Last 3 Encounters:  05/18/23 128/84  09/14/22 116/68  12/22/21 136/88    Wt Readings from Last 3 Encounters:  05/18/23 205 lb (93 kg)  09/14/22 201 lb (91.2 kg)  12/22/21 241 lb (109.3 kg)    Physical Exam Vitals reviewed.  HENT:     Nose: Nose normal.     Mouth/Throat:     Mouth: Mucous membranes are moist.  Eyes:     General: No scleral icterus.    Conjunctiva/sclera: Conjunctivae normal.  Cardiovascular:     Rate and Rhythm: Normal rate and regular rhythm.     Heart sounds: No murmur heard.    No friction rub. No gallop.  Pulmonary:     Effort: Pulmonary effort is normal.     Breath sounds: No stridor. No wheezing, rhonchi or rales.  Abdominal:     General: Abdomen is flat.     Palpations: There is no mass.     Tenderness: There is no abdominal tenderness. There is no guarding.     Hernia: No hernia is present.  Musculoskeletal:        General: Normal range of motion.     Cervical back: Neck supple.     Right lower leg: No edema.     Left lower leg: No edema.  Lymphadenopathy:     Cervical: No cervical adenopathy.  Skin:    Coloration: Skin is not jaundiced or pale.     Findings: Erythema and rash present. No bruising or lesion. Rash is urticarial. Rash is not crusting, macular, nodular, papular, purpuric, pustular, scaling or vesicular.     Nails: There is no clubbing.  Neurological:     General: No focal deficit present.     Mental Status: Faith Sellers is alert. Mental status is at baseline.  Psychiatric:        Mood and Affect: Mood normal.        Behavior: Behavior normal.     Lab Results  Component Value Date   WBC 5.0 05/18/2023   HGB 13.1 05/18/2023   HCT 40.6 05/18/2023   PLT 314.0 05/18/2023   GLUCOSE 91 05/18/2023   CHOL 189 09/14/2022   TRIG 177.0 (H) 09/14/2022   HDL 34.10 (L) 09/14/2022   LDLDIRECT 151.2 07/19/2013   LDLCALC 119 (H) 09/14/2022   ALT 5 05/18/2023   AST 13 05/18/2023   NA 138 05/18/2023   K 3.9 05/18/2023   CL  104 05/18/2023   CREATININE 0.80 05/18/2023   BUN 11 05/18/2023   CO2 27 05/18/2023   TSH 1.43 05/18/2023   INR 0.96 08/28/2011   HGBA1C 5.3 05/18/2023    No results found.  Assessment & Plan:   Essential hypertension, benign- Her blood pressure is adequately well-controlled. -     Basic metabolic panel; Future -     CBC with Differential/Platelet; Future -     Nebivolol HCl; Take 1 tablet (10 mg total) by mouth daily.  Dispense: 90 tablet; Refill: 1  Prediabetes -  Basic metabolic panel; Future -     Hemoglobin A1c; Future  Routine general medical examination at a health care facility- Exam completed, labs reviewed, vaccines reviewed and updated, cancer screenings addressed, patient education was given.  Chronic idiopathic constipation- Labs are negative for secondary causes.  Will continue Linzess. -     Basic metabolic panel; Future -     Magnesium; Future -     TSH; Future  Morbid obesity (HCC) -     TSH; Future -     Hepatic function panel; Future -     Zepbound; Inject 5 mg into the skin once a week.  Dispense: 2 mL; Refill: 0  Personal history of colonic polyps -     Ambulatory referral to Gastroenterology  Urticaria, chronic -     Ambulatory referral to Allergy  Vitamin D deficiency -     Cholecalciferol; Take 1 tablet (2,000 Units total) by mouth daily.  Dispense: 90 tablet; Refill: 1  Pure hypercholesterolemia - Statin is not indicated.     Follow-up: Return in about 6 months (around 11/17/2023).  Sanda Linger, MD

## 2023-05-24 ENCOUNTER — Encounter: Payer: Self-pay | Admitting: Internal Medicine

## 2023-05-26 ENCOUNTER — Other Ambulatory Visit (HOSPITAL_COMMUNITY): Payer: Self-pay

## 2023-05-26 ENCOUNTER — Telehealth: Payer: Self-pay

## 2023-05-26 NOTE — Telephone Encounter (Signed)
Patient Advocate Encounter  Prior Authorization for Zepbound 5MG /0.5ML pen-injectors has been approved with [employer] [employer] .    PA# [id number] Effective dates: 05/26/23 through 01/26/24

## 2023-05-26 NOTE — Telephone Encounter (Signed)
Pharmacy Patient Advocate Encounter   Received notification that prior authorization for Zepbound 5MG /0.5ML pen-injectors is required/requested.   PA submitted to [facility] via CoverMyMeds [place] or [facility]) confirmation # [id number] Status is pending

## 2023-05-27 ENCOUNTER — Other Ambulatory Visit (HOSPITAL_COMMUNITY): Payer: Self-pay

## 2023-06-10 ENCOUNTER — Encounter: Payer: Self-pay | Admitting: Allergy

## 2023-06-10 ENCOUNTER — Other Ambulatory Visit: Payer: Self-pay

## 2023-06-10 ENCOUNTER — Ambulatory Visit: Payer: No Typology Code available for payment source | Admitting: Allergy

## 2023-06-10 VITALS — BP 110/70 | HR 78 | Temp 98.2°F | Resp 20 | Ht 65.16 in | Wt 213.1 lb

## 2023-06-10 DIAGNOSIS — L501 Idiopathic urticaria: Secondary | ICD-10-CM | POA: Diagnosis not present

## 2023-06-10 MED ORDER — CETIRIZINE HCL 10 MG PO TABS: 10.0000 mg | ORAL_TABLET | Freq: Two times a day (BID) | ORAL | 5 refills | Status: AC

## 2023-06-10 MED ORDER — FAMOTIDINE 20 MG PO TABS: 20.0000 mg | ORAL_TABLET | Freq: Two times a day (BID) | ORAL | 5 refills | Status: DC

## 2023-06-10 MED ORDER — MONTELUKAST SODIUM 10 MG PO TABS
10.0000 mg | ORAL_TABLET | Freq: Every day | ORAL | 1 refills | Status: DC
Start: 2023-06-10 — End: 2024-02-01

## 2023-06-10 NOTE — Progress Notes (Signed)
New Patient Note  RE: Faith Sellers MRN: [id number] DOB: [birth year] Date of Office Visit: 06/10/2023   Primary care provider: Etta Grandchild, MD  Chief Complaint: Hives  History of present illness: Faith Sellers is a 47 y.o. female presenting today for evaluation of chronic hives.  She states she has been having hives "since Covid" in 2020.  She states the hives can occur daily and can be anywhere on the body.  She has had swelling of eyes and lips all.  All she shows me pictures where she has had recent lip swelling as well as periorbital swelling.  She states the hives/swelling respond well to benadryl 50mg  but has needed to take it at least daily if not twice a day. Doesn't make her sleepy.  Her PCP prescribed hydrocortisone 25mg  1-2 times a day and didn't help.  Also prescribed Singulair that she took daily for about 2-3 months that also didn't seem to help.  She also took Xyzal 1 tab a day which didn't help.  Also states she took Pepcid with Zyrtec for few months that helped a little.   Hives did not leave any bruising marks behind if she does not scratch.  Hives are itchy.  She has no joint aches or pains with the hives.  Denies any fever.  She has not had any bites or stings.  She denies any changes in her diet.  She denies any preceding illnesses.  She has not had any changes in soaps/detergents/body products. She does feel like she has more issues with foods containing food dye as well as sugary foods. No history of asthma, eczema.   Review of systems: Review of Systems  Constitutional: Negative.   HENT: Negative.    Eyes: Negative.   Respiratory: Negative.    Cardiovascular: Negative.   Gastrointestinal: Negative.   Musculoskeletal: Negative.   Skin:  Positive for rash.  Allergic/Immunologic: Negative.   Neurological: Negative.     All other systems negative unless noted above in HPI  Past medical history: Past Medical History:  Diagnosis Date   Anxiety     Diverticulitis    Gastric ulcer    GERD (gastroesophageal reflux disease)    Hypercholesteremia    Hypertension    Urticaria     Past surgical history: History reviewed. No pertinent surgical history.  Family history:  Family History  Adopted: Yes  Problem Relation Age of Onset   Hypertension Sister    Thyroid disease Sister    Allergic rhinitis Brother    Allergic rhinitis Son    COPD Neg Hx    Cancer Neg Hx    Early death Neg Hx    Heart disease Neg Hx    Hyperlipidemia Neg Hx    Kidney disease Neg Hx    Stroke Neg Hx     Social history: Lives in an apartment with carpeting with electric heating and central cooling.  No pets in the home.  There is no concern for water damage, mildew or roaches in the home.  She is a Database administrator.  She denies a smoking history.   Medication List: Current Outpatient Medications  Medication Sig Dispense Refill   ALPRAZolam (XANAX) 1 MG tablet Take 1 mg by mouth at bedtime as needed for anxiety.     cetirizine (ZYRTEC) 10 MG tablet Take 1 tablet (10 mg total) by mouth 2 (two) times daily. 30 tablet 5   Cholecalciferol 50 MCG (2000 UT) TABS Take 1 tablet (2,000 Units total)  by mouth daily. 90 tablet 1   famotidine (PEPCID) 20 MG tablet Take 1 tablet (20 mg total) by mouth 2 (two) times daily. 30 tablet 5   linaclotide (LINZESS) 145 MCG CAPS capsule Take 1 capsule (145 mcg total) by mouth daily before breakfast. 90 capsule 0   nebivolol (BYSTOLIC) 10 MG tablet Take 1 tablet (10 mg total) by mouth daily. 90 tablet 1   tirzepatide (ZEPBOUND) 5 MG/0.5ML Pen Inject 5 mg into the skin once a week. 2 mL 0   esomeprazole (NEXIUM) 40 MG capsule Take 1 capsule (40 mg total) by mouth daily. (Patient not taking: Reported on 06/10/2023) 90 capsule 1   hydrocortisone (ANUSOL-HC) 2.5 % rectal cream APPLY RECTALLY TO THE AFFECTED AREA TWICE DAILY (Patient not taking: Reported on 06/10/2023) 30 g 2   hydrOXYzine (ATARAX) 25 MG tablet Take 1 tablet (25 mg  total) by mouth every 8 (eight) hours as needed. (Patient not taking: Reported on 06/10/2023) 270 tablet 0   Insulin Pen Needle 32G X 6 MM MISC 1 Act by Does not apply route once a week. (Patient not taking: Reported on 06/10/2023) 50 each 0   montelukast (SINGULAIR) 10 MG tablet Take 1 tablet (10 mg total) by mouth at bedtime. 90 tablet 1   Nitroglycerin 0.4 % OINT Apply thin layer over bottom twice daily. (Patient not taking: Reported on 06/10/2023) 30 g 0   No current facility-administered medications for this visit.    Known medication allergies: No Known Allergies   Physical examination: Blood pressure 110/70, pulse 78, temperature 98.2 F (36.8 C), temperature source Temporal, resp. rate 20, height 5' 5.16" (1.655 m), weight 213 lb 1.6 oz (96.7 kg), last menstrual period 02/01/2023, SpO2 100%.  General: Alert, interactive, in no acute distress. HEENT: PERRLA, TMs pearly gray, turbinates non-edematous without discharge, post-pharynx non erythematous. Neck: Supple without lymphadenopathy. Lungs: Clear to auscultation without wheezing, rhonchi or rales. {no increased work of breathing. CV: Normal S1, S2 without murmurs. Abdomen: Nondistended, nontender. Skin: Scattered erythematous urticarial type lesions primarily located face over cheeks, upper extremities bilaterally , nonvesicular. Extremities:  No clubbing, cyanosis or edema. Neuro:   Grossly intact.  Diagnositics/Labs: Labs:  Component     Latest Ref Rng 05/18/2023  WBC     4.0 - 10.5 K/uL 5.0   RBC     3.87 - 5.11 Mil/uL 4.84   Hemoglobin     12.0 - 15.0 g/dL 16.1   HCT     09.6 - 04.5 % 40.6   MCV     78.0 - 100.0 fl 83.8   MCHC     30.0 - 36.0 g/dL 40.9   RDW     81.1 - 91.4 % 14.1   Platelets     150.0 - 400.0 K/uL 314.0   Neutrophils     43.0 - 77.0 % 61.2   Lymphocytes     12.0 - 46.0 % 33.3   Monocytes Relative     3.0 - 12.0 % 3.4   Eosinophil     0.0 - 5.0 % 1.8   Basophil     0.0 - 3.0 % 0.3    NEUT#     1.4 - 7.7 K/uL 3.1   Lymphocyte #     0.7 - 4.0 K/uL 1.7   Monocyte #     0.1 - 1.0 K/uL 0.2   Eosinophils Absolute     0.0 - 0.7 K/uL 0.1   Basophils Absolute  0.0 - 0.1 K/uL 0.0   Sodium     135 - 145 mEq/L 138   Potassium     3.5 - 5.1 mEq/L 3.9   Chloride     96 - 112 mEq/L 104   CO2     19 - 32 mEq/L 27   Glucose     70 - 99 mg/dL 91   BUN     6 - 23 mg/dL 11   Creatinine     1.61 - 1.20 mg/dL 0.96   GFR     >04.54 mL/min 87.62   Calcium     8.4 - 10.5 mg/dL 9.0   Total Bilirubin     0.2 - 1.2 mg/dL 0.6   Bilirubin, Direct     0.0 - 0.3 mg/dL 0.1   Alkaline Phosphatase     39 - 117 U/L 56   AST     0 - 37 U/L 13   ALT     0 - 35 U/L 5   Total Protein     6.0 - 8.3 g/dL 7.0   Albumin     3.5 - 5.2 g/dL 3.7   TSH     0.98 - 1.19 uIU/mL 1.43     Allergy testing: Deferred due to ongoing urticaria  Assessment and plan:   Chronic hives and swelling  - at this time etiology of hives and swelling is unknown and most likely spontaneous in nature.  Hives can be caused by a variety of different triggers including illness/infection, foods, medications, stings, exercise, pressure, vibrations, extremes of temperature to name a few however majority of the time there is no identifiable trigger.  Your symptoms have been ongoing for >6 weeks making this chronic thus will obtain labwork to evaluate: CBC w diff, CMP, tryptase, hive panel, environmental panel, alpha-gal panel, red and yellow dye IgE, chicken and fish IgE  - for management at this time take Zyrtec 2 tab twice a day, Pepcid 1 tab twice a day and Singulair once a day  - will move forward with Xolair monthly injections for control of chronic spontaneous hives.  Benefits, risks and protocol discussed today.  Babette Relic our Xolair nurse coordinator, will call you regarding approval process.  Xolair can be done at home if you would like to do so after the first 3 injections.  Let Tammy know if you would like  to do home administration.    Follow-up in 2 months or sooner if needed   I appreciate the opportunity to take part in Faith Sellers's care. Please do not hesitate to contact me with questions.  Sincerely,   Margo Aye, MD Allergy/Immunology [facility]

## 2023-06-10 NOTE — Patient Instructions (Signed)
Chronic hives and swelling  - at this time etiology of hives and swelling is unknown and most likely spontaneous in nature.  Hives can be caused by a variety of different triggers including illness/infection, foods, medications, stings, exercise, pressure, vibrations, extremes of temperature to name a few however majority of the time there is no identifiable trigger.  Your symptoms have been ongoing for >6 weeks making this chronic thus will obtain labwork to evaluate: CBC w diff, CMP, tryptase, hive panel, environmental panel, alpha-gal panel, red and yellow dye IgE, chicken and fish IgE  - for management at this time take Zyrtec 2 tab twice a day, Pepcid 1 tab twice a day and Singulair once a day  - will move forward with Xolair monthly injections for control of chronic spontaneous hives.  Benefits, risks and protocol discussed today.  Babette Relic our Xolair nurse coordinator, will call you regarding approval process.  Xolair can be done at home if you would like to do so after the first 3 injections.  Let Tammy know if you would like to do home administration.    Follow-up in 2 months or sooner if needed

## 2023-06-13 LAB — ALLERGENS W/TOTAL IGE AREA 2

## 2023-06-13 LAB — ALLERGEN, CHICKEN F83: Chicken IgE: <0.1 kU/L

## 2023-06-13 LAB — COMPREHENSIVE METABOLIC PANEL
ALT: 8 IU/L (ref 0–32)
AST: 12 IU/L (ref 0–40)
Albumin: 3.8 g/dL — ABNORMAL LOW (ref 3.9–4.9)
Alkaline Phosphatase: 71 IU/L (ref 44–121)
BUN/Creatinine Ratio: 10 (ref 9–23)
BUN: 7 mg/dL (ref 6–24)
Bilirubin Total: 0.3 mg/dL (ref 0.0–1.2)
CO2: 28 mmol/L (ref 20–29)
Calcium: 9.5 mg/dL (ref 8.7–10.2)
Chloride: 102 mmol/L (ref 96–106)
Creatinine, Ser: 0.7 mg/dL (ref 0.57–1.00)
Globulin, Total: 2.9 g/dL (ref 1.5–4.5)
Glucose: 93 mg/dL (ref 70–99)
Potassium: 4.5 mmol/L (ref 3.5–5.2)
Sodium: 141 mmol/L (ref 134–144)
Total Protein: 6.7 g/dL (ref 6.0–8.5)
eGFR: 107 mL/min/{1.73_m2} (ref >59)

## 2023-06-13 LAB — CBC WITH DIFFERENTIAL
Basophils Absolute: 0 10*3/uL (ref 0.0–0.2)
Basos: 0 %
EOS (ABSOLUTE): 0.1 10*3/uL (ref 0.0–0.4)
Eos: 2 %
Hematocrit: 40 % (ref 34.0–46.6)
Hemoglobin: 12.8 g/dL (ref 11.1–15.9)
Immature Grans (Abs): 0 10*3/uL (ref 0.0–0.1)
Immature Granulocytes: 0 %
Lymphocytes Absolute: 1.4 10*3/uL (ref 0.7–3.1)
Lymphs: 29 %
MCH: 26.9 pg (ref 26.6–33.0)
MCHC: 32 g/dL (ref 31.5–35.7)
MCV: 84 fL (ref 79–97)
Monocytes Absolute: 0.2 10*3/uL (ref 0.1–0.9)
Monocytes: 4 %
Neutrophils Absolute: 3.1 10*3/uL (ref 1.4–7.0)
Neutrophils: 65 %
RBC: 4.75 x10E6/uL (ref 3.77–5.28)
RDW: 13 % (ref 11.7–15.4)
WBC: 4.8 10*3/uL (ref 3.4–10.8)

## 2023-06-13 LAB — ALLERGEN, ANNATTO SEED

## 2023-06-13 LAB — THYROID ANTIBODIES: Thyroglobulin Antibody: <1 IU/mL (ref 0.0–0.9)

## 2023-06-13 LAB — ALPHA-GAL PANEL
Allergen Lamb IgE: <0.1 kU/L
Beef IgE: <0.1 kU/L
IgE (Immunoglobulin E), Serum: 10 IU/mL (ref 6–495)
O215-IgE Alpha-Gal: <0.1 kU/L
Pork IgE: <0.1 kU/L

## 2023-06-13 LAB — TSH: TSH: 1.96 u[IU]/mL (ref 0.450–4.500)

## 2023-06-13 LAB — ALLERGEN PROFILE, FOOD-FISH
Allergen Mackerel IgE: <0.1 kU/L
Allergen Salmon IgE: <0.1 kU/L
Allergen Trout IgE: <0.1 kU/L
Allergen Walley Pike IgE: <0.1 kU/L
Codfish IgE: <0.1 kU/L
Halibut IgE: <0.1 kU/L
Tuna: <0.1 kU/L

## 2023-06-13 LAB — TRYPTASE: Tryptase: 17.6 ug/L — ABNORMAL HIGH (ref 2.2–13.2)

## 2023-06-13 LAB — CHRONIC URTICARIA

## 2023-06-13 LAB — ALLERGEN, RED (CARMINE) DYE, RF340: F340-IgE Carmine Red Dye: <0.1 kU/L

## 2023-06-15 ENCOUNTER — Other Ambulatory Visit: Payer: Self-pay | Admitting: Internal Medicine

## 2023-06-15 MED ORDER — ZEPBOUND 7.5 MG/0.5ML ~~LOC~~ SOAJ
7.5000 mg | SUBCUTANEOUS | 0 refills | Status: DC
Start: 2023-06-15 — End: 2023-07-12

## 2023-06-21 ENCOUNTER — Telehealth: Payer: Self-pay | Admitting: *Deleted

## 2023-06-21 NOTE — Telephone Encounter (Signed)
Called patient and advised approval, copay card and submit to [employer]. Will reach out to patient once delivery set to make appt to start therapy in [facility]

## 2023-06-21 NOTE — Telephone Encounter (Signed)
-----   Message from [facility] sent at 06/10/2023  1:50 PM EDT ----- Please start Xolair for hive approval.  She has already been on Zyrtec, Xyzal, Pepcid, Singulair, hydroxyzine and currently taking Benadryl 50 mg 1-2 times daily

## 2023-06-24 LAB — THYROID ANTIBODIES: Thyroperoxidase Ab SerPl-aCnc: 20 IU/mL (ref 0–34)

## 2023-06-27 ENCOUNTER — Ambulatory Visit: Payer: No Typology Code available for payment source | Admitting: Family Medicine

## 2023-06-27 NOTE — Progress Notes (Deleted)
   [address] [place] [address] Dept: [phone]  FOLLOW UP NOTE  Patient ID: Faith Sellers, female    DOB: 1976/09/02  Age: 47 y.o. MRN: [id number] Date of Office Visit: 06/27/2023  Assessment  Chief Complaint: No chief complaint on file.  HPI Faith Sellers is a 47 year old female who presents to the clinic for follow-up visit.  She was last seen in this clinic on 06/10/2023 by Dr. 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  Lab testing at that time revealed elevated tryptase which we will recheck as well as elevated chronic urticaria panel.   Drug Allergies:  No Known Allergies  Physical Exam: There were no vitals taken for this visit.   Physical Exam  Diagnostics:    Assessment and Plan: No diagnosis found.  No orders of the defined types were placed in this encounter.   There are no Patient Instructions on file for this visit.  No follow-ups on file.    Thank you for the opportunity to care for this patient.  Please do not hesitate to contact me with questions.  Thermon Leyland, FNP [facility]

## 2023-07-05 ENCOUNTER — Telehealth: Payer: Self-pay | Admitting: *Deleted

## 2023-07-05 NOTE — Telephone Encounter (Signed)
L/m for patient to contact [facility] for appt to start Xolair delivery due 8/6

## 2023-07-07 ENCOUNTER — Ambulatory Visit: Payer: No Typology Code available for payment source | Admitting: Allergy

## 2023-07-12 ENCOUNTER — Encounter: Payer: Self-pay | Admitting: Internal Medicine

## 2023-07-12 ENCOUNTER — Other Ambulatory Visit: Payer: Self-pay | Admitting: Internal Medicine

## 2023-07-12 DIAGNOSIS — K5904 Chronic idiopathic constipation: Secondary | ICD-10-CM

## 2023-07-12 MED ORDER — ZEPBOUND 10 MG/0.5ML ~~LOC~~ SOAJ
10.0000 mg | SUBCUTANEOUS | 0 refills | Status: DC
Start: 2023-07-12 — End: 2023-08-03

## 2023-07-13 ENCOUNTER — Ambulatory Visit: Payer: No Typology Code available for payment source | Admitting: Allergy

## 2023-07-13 ENCOUNTER — Ambulatory Visit: Payer: No Typology Code available for payment source

## 2023-07-13 ENCOUNTER — Encounter: Payer: Self-pay | Admitting: Allergy

## 2023-07-13 ENCOUNTER — Other Ambulatory Visit: Payer: Self-pay

## 2023-07-13 VITALS — BP 130/86 | HR 70 | Temp 98.7°F | Ht 66.16 in | Wt 207.9 lb

## 2023-07-13 DIAGNOSIS — L501 Idiopathic urticaria: Secondary | ICD-10-CM | POA: Diagnosis not present

## 2023-07-13 MED ORDER — EPINEPHRINE 0.3 MG/0.3ML IJ SOAJ: 0.3000 mg | INTRAMUSCULAR | 1 refills | Status: AC | PRN

## 2023-07-13 MED ORDER — OMALIZUMAB 150 MG/ML ~~LOC~~ SOSY
300.0000 mg | PREFILLED_SYRINGE | SUBCUTANEOUS | Status: DC
Start: 2023-07-13 — End: 2024-01-31
  Administered 2023-07-13 – 2023-09-14 (×3): 300 mg via SUBCUTANEOUS

## 2023-07-13 NOTE — Progress Notes (Signed)
Immunotherapy   Patient Details  Name: Faith Sellers MRN: [id number] Date of Birth: Jul 22, 1976  07/13/2023  Faith Sellers started injections for  Xolair  Frequency: Every 28 days  Epi-Pen: Yes  Consent signed and patient instructions given. Patient started Xolair today and received 150mg  in the LUA and RUA equating to 300mg  total. Patient waited 30 minutes in office and did not experience any issues.    [name] 07/13/2023, 9:30 AM

## 2023-07-13 NOTE — Patient Instructions (Addendum)
Chronic hives and swelling  - hives can be caused by a variety of different triggers including illness/infection, foods, medications, stings, exercise, pressure, vibrations, extremes of temperature to name a few however majority of the time there is no identifiable trigger.   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  This indicates that you make a "self-antibody" directed to your allergy cells which can activate them leading to recurrent and chronic episodes of hives.  Tryptase level also elevated but not above 20.  Will plan to repeat level once Xolair has improved hive/swelling symptoms (likely will obtain lab at next visit).   - continue Zyrtec 2 tab twice a day, Pepcid 1 tab twice a day and Singulair once a day  - Xolair monthly injections started today.  Continue once a month in office for now.  If wanting to perform at home let us know.  Will need to complete 3 in-office Xolair injections before you can self-administer at home.    Have access to epinephrine device on Xolair injection days  - use benadryl as needed for breakthrough symptoms  - reserve prednisone use for severe hive/swelling symptoms and take 2 tabs daily x 5 days if needed  Follow-up in 3 months or sooner if needed

## 2023-07-13 NOTE — Progress Notes (Signed)
Follow-up Note  RE: Faith Sellers MRN: [id number] DOB: 1976-03-22 Date of Office Visit: 07/13/2023   History of present illness: Faith Sellers is a 47 y.o. female presenting today for follow-up of hives and swelling.  She was last seen in the office on 06/10/23 by myself.  She is set to start Xolair today for hive control.   She states yesterday she had swelling around eyes and lips. She took benadryl and swelling did resolve. She continues to have hives as well.  She states the zyrtec 2 tabs twice a day, pepcid twice a day and singulair daily does help and she is not as itchy with this regimen but still having hives and swelling.   She states when she has taken prednisone before she didn't feel well in general thus tries to avoid use.    Review of systems: 10pt ROS negative unless noted above in HPI  Past medical/social/surgical/family history have been reviewed and are unchanged unless specifically indicated below.  No changes  Medication List: Current Outpatient Medications  Medication Sig Dispense Refill   ALPRAZolam (XANAX) 1 MG tablet Take 1 mg by mouth at bedtime as needed for anxiety.     cetirizine (ZYRTEC) 10 MG tablet Take 1 tablet (10 mg total) by mouth 2 (two) times daily. 30 tablet 5   Cholecalciferol 50 MCG (2000 UT) TABS Take 1 tablet (2,000 Units total) by mouth daily. 90 tablet 1   famotidine (PEPCID) 20 MG tablet Take 1 tablet (20 mg total) by mouth 2 (two) times daily. 30 tablet 5   LINZESS 145 MCG CAPS capsule TAKE 1 CAPSULE(145 MCG) BY MOUTH DAILY BEFORE BREAKFAST 90 capsule 0   montelukast (SINGULAIR) 10 MG tablet Take 1 tablet (10 mg total) by mouth at bedtime. 90 tablet 1   tirzepatide (ZEPBOUND) 10 MG/0.5ML Pen Inject 10 mg into the skin once a week. 6 mL 0   esomeprazole (NEXIUM) 40 MG capsule Take 1 capsule (40 mg total) by mouth daily. (Patient not taking: Reported on 06/10/2023) 90 capsule 1   hydrocortisone (ANUSOL-HC) 2.5 % rectal cream APPLY RECTALLY TO THE  AFFECTED AREA TWICE DAILY (Patient not taking: Reported on 06/10/2023) 30 g 2   hydrOXYzine (ATARAX) 25 MG tablet Take 1 tablet (25 mg total) by mouth every 8 (eight) hours as needed. (Patient not taking: Reported on 06/10/2023) 270 tablet 0   Insulin Pen Needle 32G X 6 MM MISC 1 Act by Does not apply route once a week. (Patient not taking: Reported on 06/10/2023) 50 each 0   nebivolol (BYSTOLIC) 10 MG tablet Take 1 tablet (10 mg total) by mouth daily. (Patient not taking: Reported on 07/13/2023) 90 tablet 1   Nitroglycerin 0.4 % OINT Apply thin layer over bottom twice daily. (Patient not taking: Reported on 06/10/2023) 30 g 0   Current Facility-Administered Medications  Medication Dose Route Frequency Provider Last Rate Last Admin   omalizumab Geoffry Paradise) prefilled syringe 300 mg  300 mg Subcutaneous Q28 days Marcelyn Bruins, MD   300 mg at 07/13/23 7829     Known medication allergies: No Known Allergies   Physical examination: Blood pressure 120/88, pulse 75, temperature 98.7 F (37.1 C), height 5' 6.16" (1.68 m), weight 207 lb 14.4 oz (94.3 kg), SpO2 100%.  General: Alert, interactive, in no acute distress. HEENT: PERRLA, TMs pearly gray, turbinates non-edematous without discharge, post-pharynx non erythematous. Neck: Supple without lymphadenopathy. Lungs: Clear to auscultation without wheezing, rhonchi or rales. {no increased work of breathing. CV:  Normal S1, S2 without murmurs. Abdomen: Nondistended, nontender. Skin: Scattered erythematous urticarial type lesions primarily located upper arms and forearms b/l , nonvesicular. Extremities:  No clubbing, cyanosis or edema. Neuro:   Grossly intact.  Diagnositics/Labs: Labs:  Component     Latest Ref Rng 06/10/2023  D Pteronyssinus IgE     Class 0 kU/L <0.10   D Farinae IgE     Class 0 kU/L <0.10   Cat Dander IgE     Class 0 kU/L <0.10   Dog Dander IgE     Class 0 kU/L <0.10   [place] Grass IgE     Class 0 kU/L <0.10    Timothy Grass IgE     Class 0 kU/L <0.10   Johnson Grass IgE     Class 0 kU/L <0.10   Cockroach, German IgE     Class 0 kU/L <0.10   Penicillium Chrysogen IgE     Class 0 kU/L <0.10   Cladosporium Herbarum IgE     Class 0 kU/L <0.10   Aspergillus Fumigatus IgE     Class 0 kU/L <0.10   Alternaria Alternata IgE     Class 0 kU/L <0.10   Maple/Box Elder IgE     Class 0 kU/L <0.10   Common Silver Charletta Cousin IgE     Class 0 kU/L <0.10   Cedar, [place] IgE     Class 0 kU/L <0.10   Oak, White IgE     Class 0 kU/L <0.10   Elm, American IgE     Class 0 kU/L <0.10   Cottonwood IgE     Class 0 kU/L <0.10   Pecan, Hickory IgE     Class 0 kU/L <0.10   White Mulberry IgE     Class 0 kU/L <0.10   Ragweed, Short IgE     Class 0 kU/L <0.10   Pigweed, Rough IgE     Class 0 kU/L <0.10   Sheep Sorrel IgE Qn     Class 0 kU/L <0.10   Mouse Urine IgE     Class 0 kU/L <0.10   WBC     3.4 - 10.8 x10E3/uL 4.8   RBC     3.77 - 5.28 x10E6/uL 4.75   Hemoglobin     11.1 - 15.9 g/dL 16.1   HCT     09.6 - 04.5 % 40.0   MCV     79 - 97 fL 84   MCH     26.6 - 33.0 pg 26.9   MCHC     31.5 - 35.7 g/dL 40.9   RDW     81.1 - 91.4 % 13.0   Neutrophils     Not Estab. % 65   Lymphs     Not Estab. % 29   Monocytes     Not Estab. % 4   Eos     Not Estab. % 2   Basos     Not Estab. % 0   NEUT#     1.4 - 7.0 x10E3/uL 3.1   Lymphocyte #     0.7 - 3.1 x10E3/uL 1.4   Monocytes Absolute     0.1 - 0.9 x10E3/uL 0.2   EOS (ABSOLUTE)     0.0 - 0.4 x10E3/uL 0.1   Basophils Absolute     0.0 - 0.2 x10E3/uL 0.0   Immature Granulocytes     Not Estab. % 0   Immature Grans (Abs)     0.0 - 0.1  x10E3/uL 0.0   Glucose     70 - 99 mg/dL 93   BUN     6 - 24 mg/dL 7   Creatinine     1.60 - 1.00 mg/dL 1.09   eGFR     >32 TF/TDD/2.20 107   BUN/Creatinine Ratio     9 - 23  10   Sodium     134 - 144 mmol/L 141   Potassium     3.5 - 5.2 mmol/L 4.5   Chloride     96 - 106 mmol/L 102   CO2     20  - 29 mmol/L 28   Calcium     8.7 - 10.2 mg/dL 9.5   Total Protein     6.0 - 8.5 g/dL 6.7   Albumin     3.9 - 4.9 g/dL 3.8 (L)   Globulin, Total     1.5 - 4.5 g/dL 2.9   Total Bilirubin     0.0 - 1.2 mg/dL 0.3   Alkaline Phosphatase     44 - 121 IU/L 71   AST     0 - 40 IU/L 12   ALT     0 - 32 IU/L 8   Codfish IgE     Class 0 kU/L <0.10   Halibut IgE     Class 0 kU/L <0.10   Allergen Walley Pike IgE     Class 0 kU/L <0.10   Tuna     Class 0 kU/L <0.10   Allergen Salmon IgE     Class 0 kU/L <0.10   Allergen Mackerel IgE     Class 0 kU/L <0.10   Allergen Trout IgE     Class 0 kU/L <0.10   Class Description Allergens Comment   IgE (Immunoglobulin E), Serum     6 - 495 IU/mL 10   O215-IgE Alpha-Gal     Class 0 kU/L <0.10   Beef IgE     Class 0 kU/L <0.10   Pork IgE     Class 0 kU/L <0.10   Allergen Lamb IgE     Class 0 kU/L <0.10   Thyroperoxidase Ab SerPl-aCnc     0 - 34 IU/mL 20   Thyroglobulin Antibody     0.0 - 0.9 IU/mL <1.0   Annatto Seed IgE*     <0.35 kU/L <0.35   Class Interpretation 0   Tryptase     2.2 - 13.2 ug/L 17.6 (H)   cu index     <10  22.4 (H)   TSH     0.450 - 4.500 uIU/mL 1.960   F340-IgE Carmine Red Dye     Class 0 kU/L <0.10   Chicken IgE     Class 0 kU/L <0.10    Xolair injections 300mg  dosing provided today in 2- 150mg  injections given.  She tolerated observation period without issue and stable vitals upon discharge.   Assessment and plan: Urticaria and angioedema  - hives can be caused by a variety of different triggers including illness/infection, foods, medications, stings, exercise, pressure, vibrations, extremes of temperature to name a few however majority of the time there is no identifiable trigger.   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  This indicates that you make a "self-antibody" directed to your allergy cells which can activate them leading to recurrent and  chronic episodes of hives.  Tryptase level also elevated but not above 20.  Will plan to repeat level once Xolair has improved hive/swelling symptoms (likely will obtain lab at next visit).   - continue Zyrtec 2 tab twice a day, Pepcid 1 tab twice a day and Singulair once a day  - Xolair monthly injections started today.  Continue once a month in office for now.  If wanting to perform at home let us know.  Will need to complete 3 in-office Xolair injections before you can self-administer at home.    Have access to epinephrine device on Xolair injection days  - use benadryl as needed for breakthrough symptoms  - reserve prednisone use for severe hive/swelling symptoms and take 2 tabs daily x 5 days if needed  Follow-up in 3 months or sooner if needed  I appreciate the opportunity to take part in Faith Sellers's care. Please do not hesitate to contact me with questions.  Sincerely,   Margo Aye, MD Allergy/Immunology [facility]

## 2023-07-14 ENCOUNTER — Other Ambulatory Visit (HOSPITAL_COMMUNITY): Payer: Self-pay

## 2023-08-03 ENCOUNTER — Encounter: Payer: Self-pay | Admitting: Internal Medicine

## 2023-08-03 ENCOUNTER — Other Ambulatory Visit: Payer: Self-pay | Admitting: Internal Medicine

## 2023-08-03 MED ORDER — ZEPBOUND 12.5 MG/0.5ML ~~LOC~~ SOAJ
12.5000 mg | SUBCUTANEOUS | 0 refills | Status: DC
Start: 2023-08-03 — End: 2023-09-02

## 2023-08-10 ENCOUNTER — Ambulatory Visit: Payer: No Typology Code available for payment source

## 2023-08-16 ENCOUNTER — Ambulatory Visit: Payer: No Typology Code available for payment source

## 2023-08-17 ENCOUNTER — Ambulatory Visit: Payer: No Typology Code available for payment source

## 2023-08-17 DIAGNOSIS — L501 Idiopathic urticaria: Secondary | ICD-10-CM | POA: Diagnosis not present

## 2023-09-02 ENCOUNTER — Other Ambulatory Visit: Payer: Self-pay | Admitting: Internal Medicine

## 2023-09-02 MED ORDER — ZEPBOUND 15 MG/0.5ML ~~LOC~~ SOAJ
15.0000 mg | SUBCUTANEOUS | 0 refills | Status: DC
Start: 2023-09-02 — End: 2024-02-01

## 2023-09-07 ENCOUNTER — Other Ambulatory Visit: Payer: Self-pay | Admitting: Allergy

## 2023-09-08 ENCOUNTER — Encounter: Payer: Self-pay | Admitting: Allergy

## 2023-09-08 ENCOUNTER — Encounter: Payer: Self-pay | Admitting: Internal Medicine

## 2023-09-12 ENCOUNTER — Other Ambulatory Visit: Payer: Self-pay | Admitting: Internal Medicine

## 2023-09-14 ENCOUNTER — Ambulatory Visit: Payer: No Typology Code available for payment source | Admitting: *Deleted

## 2023-09-14 DIAGNOSIS — L501 Idiopathic urticaria: Secondary | ICD-10-CM

## 2023-09-30 ENCOUNTER — Ambulatory Visit: Payer: No Typology Code available for payment source | Admitting: Allergy

## 2023-10-10 ENCOUNTER — Ambulatory Visit: Payer: No Typology Code available for payment source | Admitting: Internal Medicine

## 2023-10-10 DIAGNOSIS — J309 Allergic rhinitis, unspecified: Secondary | ICD-10-CM

## 2023-10-10 NOTE — Progress Notes (Deleted)
FOLLOW UP Date of Service/Encounter:  10/10/23  Subjective:  Faith Sellers (DOB: [birth year]) is a 47 y.o. female who [facility] in re-evaluation of the following: chronic urticaria with angioedema History obtained from: chart review and {Persons; PED relatives w/patient:19415::"patient"}.  For Review, LV was on 07/13/23  with Dr. Delorse Lek seen for routine follow-up. See below for summary of history and diagnostics.   Therapeutic plans/changes recommended: Xolair 300 mg q4 week sstarted. ----------------------------------------------------- Pertinent History/Diagnostics:  Urticaria:  having hives "since Covid" in 2020. She states the hives can occur daily and can be anywhere on the body. She has had swelling of eyes and lips. No bruising, no fevers. Xolair started 07/13/23 - labs (06/10/23):CMP, CBC wnl, normal thyroid studies, negative IgE to chicken, fish, red meat, red dye, yellow dye, environmental allergy panel, elevated CU index 22.4, elevated tryptase 17.6  --------------------------------------------------- Today presents for follow-up. Discussed the use of AI scribe software for clinical note transcription with the patient, who gave verbal consent to proceed.  History of Present Illness             Chart Review: Last xolair injection 09/14/23.  All medications reviewed by clinical staff and updated in chart. No new pertinent medical or surgical history except as noted in HPI.  ROS: All others negative except as noted per HPI.   Objective:  There were no vitals taken for this visit. There is no height or weight on file to calculate BMI. Physical Exam: General Appearance:  Alert, cooperative, no distress, appears stated age  Head:  Normocephalic, without obvious abnormality, atraumatic  Eyes:  Conjunctiva clear, EOM's intact  Ears {Blank multiple:19196:a:"***","EACs normal bilaterally","normal TMs bilaterally","ear tubes  present bilaterally without exudate"}  Nose: Nares normal, {Blank multiple:19196:a:"***","hypertrophic turbinates","normal mucosa","no visible anterior polyps","septum midline"}  Throat: Lips, tongue normal; teeth and gums normal, {Blank multiple:19196:a:"***","normal posterior oropharynx","tonsils 2+","tonsils 3+","no tonsillar exudate","+ cobblestoning","surgically absent tonsils"}  Neck: Supple, symmetrical  Lungs:   {Blank multiple:19196:a:"***","clear to auscultation bilaterally","end-expiratory wheezing","wheezing throughout"}, Respirations unlabored, {Blank multiple:19196:a:"***","no coughing","intermittent dry coughing"}  Heart:  {Blank multiple:19196:a:"***","regular rate and rhythm","no murmur"}, Appears well perfused  Extremities: No edema  Skin: {Blank multiple:19196:a:"***","erythematous, dry patches scattered on ***","lichenification on ***","Skin color, texture, turgor normal","no rashes or lesions on visualized portions of skin"}  Neurologic: No gross deficits   Labs:  Lab Orders  No laboratory test(s) ordered today    Spirometry:  Tracings reviewed. Her effort: {Blank single:19197::"Good reproducible efforts.","It was hard to get consistent efforts and there is a question as to whether this reflects a maximal maneuver.","Poor effort, data can not be interpreted.","Variable effort-results affected","effort okay for first attempt at spirometry.","Results not reproducible due to ***"} FVC: ***L FEV1: ***L, ***% predicted FEV1/FVC ratio: ***% Interpretation: {Blank single:19197::"Spirometry consistent with mild obstructive disease","Spirometry consistent with moderate obstructive disease","Spirometry consistent with severe obstructive disease","Spirometry consistent with possible restrictive disease","Spirometry consistent with mixed obstructive and restrictive disease","Spirometry uninterpretable due to technique","Spirometry consistent with normal pattern","No overt abnormalities  noted given today's efforts","Nonobstructive ratio, low FEV1","Nonobstructive ratio, low FEV1, possible restriction"}.  Please see scanned spirometry results for details.  Skin Testing: {Blank single:19197::"Select foods","Environmental allergy panel","Environmental allergy panel and select foods","Food allergy panel","None","Deferred due to recent antihistamines use","deferred due to recent reaction","Pediatric Environmental Allergy Panel","Pediatric Food Panel","Select foods and environmental allergies"}. {Blank single:19197::"Adequate positive and negative controls","Inadequate positive control-testing invalid","Adequate positive and negative controls, dermatographism present, testing difficult to interpret"}. Results discussed with patient/family.   {Blank single:19197::"Allergy testing results were read and interpreted by myself, documented by  clinical staff.","Allergy testing results were read by ***,FNP, documented by clinical staff"}  Assessment/Plan   ***  Other: {Blank multiple:19196:a:"***","samples provided of: ***","spacer provided in clinic","nebulizer machine provided in clinic","school forms provided","reviewed spirometry technique","reviewed inhaler technique","allergy injection given in clinic today","biologic given in clinic today"}  Tonny Bollman, MD  [facility]

## 2023-10-12 ENCOUNTER — Ambulatory Visit: Payer: No Typology Code available for payment source

## 2023-11-18 ENCOUNTER — Ambulatory Visit: Payer: No Typology Code available for payment source | Admitting: Allergy

## 2023-11-21 ENCOUNTER — Ambulatory Visit: Payer: No Typology Code available for payment source | Admitting: Internal Medicine

## 2023-11-24 ENCOUNTER — Ambulatory Visit: Payer: No Typology Code available for payment source | Admitting: Internal Medicine

## 2023-11-29 ENCOUNTER — Ambulatory Visit: Payer: No Typology Code available for payment source | Admitting: Internal Medicine

## 2023-12-02 ENCOUNTER — Ambulatory Visit: Payer: No Typology Code available for payment source | Admitting: Allergy

## 2024-01-09 ENCOUNTER — Ambulatory Visit: Payer: No Typology Code available for payment source | Admitting: Internal Medicine

## 2024-01-16 ENCOUNTER — Other Ambulatory Visit: Payer: Self-pay | Admitting: Internal Medicine

## 2024-01-18 ENCOUNTER — Other Ambulatory Visit: Payer: Self-pay | Admitting: Internal Medicine

## 2024-01-18 NOTE — Telephone Encounter (Signed)
 Copied from CRM [id number]. Topic: Clinical - Medication Refill >> Jan 18, 2024  1:14 PM Gurney Maxin H wrote: Most Recent Primary Care Visit:  Provider: Etta Grandchild  Department: [facility]  Visit Type: PHYSICAL  Date: 05/18/2023  Medication: tirzepatide (ZEPBOUND) 15 MG/0.5ML Pen  Has the patient contacted their pharmacy? Yes (Agent: If no, request that the patient contact the pharmacy for the refill. If patient does not wish to contact the pharmacy document the reason why and proceed with request.) (Agent: If yes, when and what did the pharmacy advise?)  Is this the correct pharmacy for this prescription? Yes If no, delete pharmacy and type the correct one.  This is the patient's preferred pharmacy:  [id number] - [place], [place] - [address] AT [facility] [address] [place] [address] Phone: [phone] Fax: [phone]    Has the prescription been filled recently? No  Is the patient out of the medication? Yes  Has the patient been seen for an appointment in the last year OR does the patient have an upcoming appointment? Yes  Can we respond through MyChart? Yes  Agent: Please be advised that Rx refills may take up to 3 business days. We ask that you follow-up with your pharmacy.

## 2024-01-19 ENCOUNTER — Other Ambulatory Visit: Payer: Self-pay | Admitting: Internal Medicine

## 2024-01-23 ENCOUNTER — Telehealth: Payer: Self-pay

## 2024-01-23 NOTE — Telephone Encounter (Signed)
 She hasn't seen you since June. She's going to have to wait for refills?

## 2024-01-23 NOTE — Telephone Encounter (Signed)
 Copied from CRM [id number]. Topic: Clinical - Medication Question >> Jan 23, 2024  1:41 PM Elizebeth Brooking wrote:  Reason for CRM: Patient called in regarding her prescription tirzepatide (ZEPBOUND) 15 MG/0.5ML Pen, wanted to know if she will have to wait until her appointment to get her prescription refilled is requesting a callback regarding this issue

## 2024-01-23 NOTE — Telephone Encounter (Signed)
 Patient has been made aware and gave a verbal understanding.

## 2024-01-31 ENCOUNTER — Ambulatory Visit: Payer: No Typology Code available for payment source | Admitting: Internal Medicine

## 2024-01-31 ENCOUNTER — Encounter: Payer: Self-pay | Admitting: Internal Medicine

## 2024-01-31 ENCOUNTER — Other Ambulatory Visit: Payer: Self-pay | Admitting: Internal Medicine

## 2024-01-31 VITALS — BP 142/92 | HR 74 | Temp 97.8°F | Resp 16 | Ht 66.16 in | Wt 210.0 lb

## 2024-01-31 DIAGNOSIS — E781 Pure hyperglyceridemia: Secondary | ICD-10-CM

## 2024-01-31 DIAGNOSIS — I119 Hypertensive heart disease without heart failure: Secondary | ICD-10-CM

## 2024-01-31 DIAGNOSIS — I10 Essential (primary) hypertension: Secondary | ICD-10-CM

## 2024-01-31 DIAGNOSIS — K219 Gastro-esophageal reflux disease without esophagitis: Secondary | ICD-10-CM

## 2024-01-31 DIAGNOSIS — L501 Idiopathic urticaria: Secondary | ICD-10-CM

## 2024-01-31 DIAGNOSIS — Z124 Encounter for screening for malignant neoplasm of cervix: Secondary | ICD-10-CM | POA: Insufficient documentation

## 2024-01-31 DIAGNOSIS — E559 Vitamin D deficiency, unspecified: Secondary | ICD-10-CM | POA: Diagnosis not present

## 2024-01-31 DIAGNOSIS — E7800 Pure hypercholesterolemia, unspecified: Secondary | ICD-10-CM

## 2024-01-31 DIAGNOSIS — L508 Other urticaria: Secondary | ICD-10-CM

## 2024-01-31 LAB — LIPID PANEL
Cholesterol: 218 mg/dL — ABNORMAL HIGH (ref 0–200)
HDL: 35.9 mg/dL — ABNORMAL LOW (ref >39.00)
LDL Cholesterol: 139 mg/dL — ABNORMAL HIGH (ref 0–99)
NonHDL: 181.98
Total CHOL/HDL Ratio: 6
Triglycerides: 217 mg/dL — ABNORMAL HIGH (ref 0.0–149.0)
VLDL: 43.4 mg/dL — ABNORMAL HIGH (ref 0.0–40.0)

## 2024-01-31 LAB — CBC WITH DIFFERENTIAL/PLATELET
Basophils Absolute: 0 10*3/uL (ref 0.0–0.1)
Basophils Relative: 0.1 % (ref 0.0–3.0)
Eosinophils Absolute: 0.1 10*3/uL (ref 0.0–0.7)
Eosinophils Relative: 0.9 % (ref 0.0–5.0)
HCT: 41.8 % (ref 36.0–46.0)
Hemoglobin: 13.9 g/dL (ref 12.0–15.0)
Lymphocytes Relative: 42 % (ref 12.0–46.0)
Lymphs Abs: 2.5 10*3/uL (ref 0.7–4.0)
MCHC: 33.3 g/dL (ref 30.0–36.0)
MCV: 86.3 fl (ref 78.0–100.0)
Monocytes Absolute: 0.2 10*3/uL (ref 0.1–1.0)
Monocytes Relative: 3.9 % (ref 3.0–12.0)
Neutro Abs: 3.2 10*3/uL (ref 1.4–7.7)
Neutrophils Relative %: 53.1 % (ref 43.0–77.0)
Platelets: 321 10*3/uL (ref 150.0–400.0)
RBC: 4.85 Mil/uL (ref 3.87–5.11)
RDW: 13.8 % (ref 11.5–15.5)
WBC: 6 10*3/uL (ref 4.0–10.5)

## 2024-01-31 LAB — BASIC METABOLIC PANEL
BUN: 15 mg/dL (ref 6–23)
CO2: 28 meq/L (ref 19–32)
Calcium: 9.6 mg/dL (ref 8.4–10.5)
Chloride: 103 meq/L (ref 96–112)
Creatinine, Ser: 0.84 mg/dL (ref 0.40–1.20)
GFR: 82.23 mL/min (ref >60.00)
Glucose, Bld: 88 mg/dL (ref 70–99)
Potassium: 4.1 meq/L (ref 3.5–5.1)
Sodium: 138 meq/L (ref 135–145)

## 2024-01-31 LAB — HEPATIC FUNCTION PANEL
ALT: 7 U/L (ref 0–35)
AST: 14 U/L (ref 0–37)
Albumin: 4 g/dL (ref 3.5–5.2)
Alkaline Phosphatase: 65 U/L (ref 39–117)
Bilirubin, Direct: 0.1 mg/dL (ref 0.0–0.3)
Total Bilirubin: 0.6 mg/dL (ref 0.2–1.2)
Total Protein: 7.5 g/dL (ref 6.0–8.3)

## 2024-01-31 LAB — VITAMIN D 25 HYDROXY (VIT D DEFICIENCY, FRACTURES): VITD: 46.31 ng/mL (ref 30.00–100.00)

## 2024-01-31 LAB — HEMOGLOBIN A1C: Hgb A1c MFr Bld: 5.4 % (ref 4.6–6.5)

## 2024-01-31 MED ORDER — HYDROXYZINE HCL 25 MG PO TABS
25.0000 mg | ORAL_TABLET | Freq: Three times a day (TID) | ORAL | 0 refills | Status: AC | PRN
Start: 2024-01-31 — End: ?

## 2024-01-31 NOTE — Progress Notes (Signed)
 Subjective:  Patient ID: Faith Sellers, female    DOB: [birth year]  Age: 48 y.o. MRN: [id number]  CC: Hypertension and Hyperlipidemia   HPI Ellison Leisure presents for f/up -----  Discussed the use of AI scribe software for clinical note transcription with the patient, who gave verbal consent to proceed.  History of Present Illness   Faith Sellers is a 48 year old female with hypertension who presents with elevated blood pressure.  She has experienced a recent increase in blood pressure despite adherence to her daily Nebivolol regimen. No associated symptoms such as headache, blurred vision, chest pain, or shortness of breath are present. Her blood pressure was previously stable at 120/88 mmHg in August.  She attributes the rise in blood pressure to dietary changes over the past three months and increased stress from working at home. She maintains an exercise routine using a treadmill at home, walking for about 30 minutes daily and achieving approximately 3,000 steps without experiencing chest pain or shortness of breath during exercise.  She has ongoing hives and consulted an allergist who prescribed Xolair. However, she experienced hair loss and severe joint pain while on Xolair, leading to its discontinuation after five months without significant improvement in her symptoms.       Outpatient Medications Prior to Visit  Medication Sig Dispense Refill   ALPRAZolam (XANAX) 1 MG tablet Take 1 mg by mouth at bedtime as needed for anxiety.     cetirizine (ZYRTEC) 10 MG tablet Take 1 tablet (10 mg total) by mouth 2 (two) times daily. 30 tablet 5   Cholecalciferol 50 MCG (2000 UT) TABS Take 1 tablet (2,000 Units total) by mouth daily. 90 tablet 1   EPINEPHrine (EPIPEN 2-PAK) 0.3 mg/0.3 mL IJ SOAJ injection Inject 0.3 mg into the muscle as needed for anaphylaxis. 0.3 mL 1   esomeprazole (NEXIUM) 40 MG capsule Take 1 capsule (40 mg total) by mouth daily. 90 capsule 1   hydrocortisone (ANUSOL-HC) 2.5 %  rectal cream APPLY RECTALLY TO THE AFFECTED AREA TWICE DAILY 30 g 2   LINZESS 145 MCG CAPS capsule TAKE 1 CAPSULE(145 MCG) BY MOUTH DAILY BEFORE BREAKFAST 90 capsule 0   Nitroglycerin 0.4 % OINT Apply thin layer over bottom twice daily. 30 g 0   famotidine (PEPCID) 20 MG tablet Take 1 tablet (20 mg total) by mouth 2 (two) times daily. 30 tablet 5   hydrOXYzine (ATARAX) 25 MG tablet Take 1 tablet (25 mg total) by mouth every 8 (eight) hours as needed. 270 tablet 0   Insulin Pen Needle 32G X 6 MM MISC 1 Act by Does not apply route once a week. 50 each 0   montelukast (SINGULAIR) 10 MG tablet Take 1 tablet (10 mg total) by mouth at bedtime. 90 tablet 1   nebivolol (BYSTOLIC) 10 MG tablet Take 1 tablet (10 mg total) by mouth daily. 90 tablet 1   tirzepatide (ZEPBOUND) 15 MG/0.5ML Pen Inject 15 mg into the skin once a week. 6 mL 0   omalizumab (XOLAIR) prefilled syringe 300 mg      No facility-administered medications prior to visit.    ROS Review of Systems  Constitutional: Negative.  Negative for appetite change, chills, diaphoresis and fatigue.  HENT: Negative.    Eyes: Negative.   Respiratory:  Negative for cough, chest tightness, shortness of breath and wheezing.   Cardiovascular:  Negative for chest pain, palpitations and leg swelling.  Gastrointestinal: Negative.  Negative for abdominal pain, constipation, diarrhea, nausea and vomiting.  Endocrine: Negative.   Genitourinary: Negative.  Negative for difficulty urinating.  Musculoskeletal: Negative.  Negative for arthralgias, joint swelling and myalgias.  Skin:  Positive for color change and rash.  Neurological:  Negative for dizziness, tremors and weakness.  Hematological:  Negative for adenopathy. Does not bruise/bleed easily.  Psychiatric/Behavioral: Negative.      Objective:  BP (!) 142/92 (BP Location: Left Arm, Patient Position: Sitting, Cuff Size: Large) Comment: BP (R) 148/88  Pulse 74   Temp 97.8 F (36.6 C) (Oral)   Resp  16   Ht 5' 6.16" (1.68 m)   Wt 210 lb (95.3 kg)   LMP 02/01/2023 (Approximate)   SpO2 96%   BMI 33.73 kg/m   BP Readings from Last 3 Encounters:  01/31/24 (!) 142/92  07/13/23 130/86  06/10/23 110/70    Wt Readings from Last 3 Encounters:  01/31/24 210 lb (95.3 kg)  07/13/23 207 lb 14.4 oz (94.3 kg)  06/10/23 213 lb 1.6 oz (96.7 kg)    Physical Exam Vitals reviewed.  Constitutional:      Appearance: Normal appearance.  HENT:     Nose: Nose normal.     Mouth/Throat:     Mouth: Mucous membranes are moist.  Eyes:     General: No scleral icterus.    Conjunctiva/sclera: Conjunctivae normal.  Cardiovascular:     Rate and Rhythm: Normal rate and regular rhythm.     Heart sounds: Normal heart sounds, S1 normal and S2 normal. No murmur heard.    Comments: EKG---  NSR, 67 bpm Minimal LVH No Q waves unchanged Pulmonary:     Effort: Pulmonary effort is normal.     Breath sounds: No stridor. No wheezing, rhonchi or rales.  Abdominal:     General: Abdomen is flat.     Palpations: There is no mass.     Tenderness: There is no abdominal tenderness. There is no guarding.     Hernia: No hernia is present.  Musculoskeletal:     Cervical back: Neck supple.     Right lower leg: No edema.     Left lower leg: No edema.  Skin:    General: Skin is warm.     Findings: Erythema and rash present. Rash is macular and urticarial. Rash is not nodular, papular, purpuric, pustular, scaling or vesicular.  Neurological:     General: No focal deficit present.     Mental Status: She is alert. Mental status is at baseline.  Psychiatric:        Mood and Affect: Mood normal.        Behavior: Behavior normal.     Lab Results  Component Value Date   WBC 6.0 01/31/2024   HGB 13.9 01/31/2024   HCT 41.8 01/31/2024   PLT 321.0 01/31/2024   GLUCOSE 88 01/31/2024   CHOL 218 (H) 01/31/2024   TRIG 217.0 (H) 01/31/2024   HDL 35.90 (L) 01/31/2024   LDLDIRECT 151.2 07/19/2013   LDLCALC 139 (H)  01/31/2024   ALT 7 01/31/2024   AST 14 01/31/2024   NA 138 01/31/2024   K 4.1 01/31/2024   CL 103 01/31/2024   CREATININE 0.84 01/31/2024   BUN 15 01/31/2024   CO2 28 01/31/2024   TSH 1.960 06/10/2023   INR 0.96 08/28/2011   HGBA1C 5.4 01/31/2024    No results found.  Assessment & Plan:   Essential hypertension, benign- Will try to get better control of her BP. -     Basic metabolic panel; Future -  CBC with Differential/Platelet; Future -     EKG 12-Lead -     Urinalysis, Routine w reflex microscopic; Future -     Triamterene-HCTZ; Take 1 each (1 capsule total) by mouth daily.  Dispense: 90 capsule; Refill: 1 -     Nebivolol HCl; Take 1 tablet (10 mg total) by mouth daily.  Dispense: 90 tablet; Refill: 1  Gastroesophageal reflux disease without esophagitis -     CBC with Differential/Platelet; Future  Vitamin D deficiency -     VITAMIN D 25 Hydroxy (Vit-D Deficiency, Fractures); Future  Morbid obesity (HCC) -     Hepatic function panel; Future -     Hemoglobin A1c; Future -     Zepbound; Inject 15 mg into the skin once a week.  Dispense: 6 mL; Refill: 1  Pure hyperglyceridemia -     Lipid panel; Future -     Hepatic function panel; Future  Urticaria, chronic -     hydrOXYzine HCl; Take 1 tablet (25 mg total) by mouth every 8 (eight) hours as needed.  Dispense: 270 tablet; Refill: 0 -     Famotidine; Take 1 tablet (20 mg total) by mouth 2 (two) times daily.  Dispense: 90 tablet; Refill: 1 -     Montelukast Sodium; Take 1 tablet (10 mg total) by mouth at bedtime.  Dispense: 90 tablet; Refill: 1  LVH (left ventricular hypertrophy) due to hypertensive disease, without heart failure -     Triamterene-HCTZ; Take 1 each (1 capsule total) by mouth daily.  Dispense: 90 capsule; Refill: 1 -     Nebivolol HCl; Take 1 tablet (10 mg total) by mouth daily.  Dispense: 90 tablet; Refill: 1  Cervical cancer screening -     Ambulatory referral to Gynecology     Follow-up:  Return in about 6 months (around 08/02/2024).  Sanda Linger, MD

## 2024-01-31 NOTE — Patient Instructions (Signed)
 Hypertension, Adult High blood pressure (hypertension) is when the force of blood pumping through the arteries is too strong. The arteries are the blood vessels that carry blood from the heart throughout the body. Hypertension forces the heart to work harder to pump blood and may cause arteries to become narrow or stiff. Untreated or uncontrolled hypertension can lead to a heart attack, heart failure, a stroke, kidney disease, and other problems. A blood pressure reading consists of a higher number over a lower number. Ideally, your blood pressure should be below 120/80. The first ("top") number is called the systolic pressure. It is a measure of the pressure in your arteries as your heart beats. The second ("bottom") number is called the diastolic pressure. It is a measure of the pressure in your arteries as the heart relaxes. What are the causes? The exact cause of this condition is not known. There are some conditions that result in high blood pressure. What increases the risk? Certain factors may make you more likely to develop high blood pressure. Some of these risk factors are under your control, including: Smoking. Not getting enough exercise or physical activity. Being overweight. Having too much fat, sugar, calories, or salt (sodium) in your diet. Drinking too much alcohol. Other risk factors include: Having a personal history of heart disease, diabetes, high cholesterol, or kidney disease. Stress. Having a family history of high blood pressure and high cholesterol. Having obstructive sleep apnea. Age. The risk increases with age. What are the signs or symptoms? High blood pressure may not cause symptoms. Very high blood pressure (hypertensive crisis) may cause: Headache. Fast or irregular heartbeats (palpitations). Shortness of breath. Nosebleed. Nausea and vomiting. Vision changes. Severe chest pain, dizziness, and seizures. How is this diagnosed? This condition is diagnosed by  measuring your blood pressure while you are seated, with your arm resting on a flat surface, your legs uncrossed, and your feet flat on the floor. The cuff of the blood pressure monitor will be placed directly against the skin of your upper arm at the level of your heart. Blood pressure should be measured at least twice using the same arm. Certain conditions can cause a difference in blood pressure between your right and left arms. If you have a high blood pressure reading during one visit or you have normal blood pressure with other risk factors, you may be asked to: Return on a different day to have your blood pressure checked again. Monitor your blood pressure at home for 1 week or longer. If you are diagnosed with hypertension, you may have other blood or imaging tests to help your health care provider understand your overall risk for other conditions. How is this treated? This condition is treated by making healthy lifestyle changes, such as eating healthy foods, exercising more, and reducing your alcohol intake. You may be referred for counseling on a healthy diet and physical activity. Your health care provider may prescribe medicine if lifestyle changes are not enough to get your blood pressure under control and if: Your systolic blood pressure is above 130. Your diastolic blood pressure is above 80. Your personal target blood pressure may vary depending on your medical conditions, your age, and other factors. Follow these instructions at home: Eating and drinking  Eat a diet that is high in fiber and potassium, and low in sodium, added sugar, and fat. An example of this eating plan is called the DASH diet. DASH stands for Dietary Approaches to Stop Hypertension. To eat this way: Eat  plenty of fresh fruits and vegetables. Try to fill one half of your plate at each meal with fruits and vegetables. Eat whole grains, such as whole-wheat pasta, brown rice, or whole-grain bread. Fill about one  fourth of your plate with whole grains. Eat or drink low-fat dairy products, such as skim milk or low-fat yogurt. Avoid fatty cuts of meat, processed or cured meats, and poultry with skin. Fill about one fourth of your plate with lean proteins, such as fish, chicken without skin, beans, eggs, or tofu. Avoid pre-made and processed foods. These tend to be higher in sodium, added sugar, and fat. Reduce your daily sodium intake. Many people with hypertension should eat less than 1,500 mg of sodium a day. Do not drink alcohol if: Your health care provider tells you not to drink. You are pregnant, may be pregnant, or are planning to become pregnant. If you drink alcohol: Limit how much you have to: 0-1 drink a day for women. 0-2 drinks a day for men. Know how much alcohol is in your drink. In the [place] one drink equals one 12 oz bottle of beer (355 mL), one 5 oz glass of wine (148 mL), or one 1 oz glass of hard liquor (44 mL). Lifestyle  Work with your health care provider to maintain a healthy body weight or to lose weight. Ask what an ideal weight is for you. Get at least 30 minutes of exercise that causes your heart to beat faster (aerobic exercise) most days of the week. Activities may include walking, swimming, or biking. Include exercise to strengthen your muscles (resistance exercise), such as Pilates or lifting weights, as part of your weekly exercise routine. Try to do these types of exercises for 30 minutes at least 3 days a week. Do not use any products that contain nicotine or tobacco. These products include cigarettes, chewing tobacco, and vaping devices, such as e-cigarettes. If you need help quitting, ask your health care provider. Monitor your blood pressure at home as told by your health care provider. Keep all follow-up visits. This is important. Medicines Take over-the-counter and prescription medicines only as told by your health care provider. Follow directions carefully. Blood  pressure medicines must be taken as prescribed. Do not skip doses of blood pressure medicine. Doing this puts you at risk for problems and can make the medicine less effective. Ask your health care provider about side effects or reactions to medicines that you should watch for. Contact a health care provider if you: Think you are having a reaction to a medicine you are taking. Have headaches that keep coming back (recurring). Feel dizzy. Have swelling in your ankles. Have trouble with your vision. Get help right away if you: Develop a severe headache or confusion. Have unusual weakness or numbness. Feel faint. Have severe pain in your chest or abdomen. Vomit repeatedly. Have trouble breathing. These symptoms may be an emergency. Get help right away. Call 911. Do not wait to see if the symptoms will go away. Do not drive yourself to the hospital. Summary Hypertension is when the force of blood pumping through your arteries is too strong. If this condition is not controlled, it may put you at risk for serious complications. Your personal target blood pressure may vary depending on your medical conditions, your age, and other factors. For most people, a normal blood pressure is less than 120/80. Hypertension is treated with lifestyle changes, medicines, or a combination of both. Lifestyle changes include losing weight, eating a healthy,  low-sodium diet, exercising more, and limiting alcohol. This information is not intended to replace advice given to you by your health care provider. Make sure you discuss any questions you have with your health care provider. Document Revised: 09/22/2021 Document Reviewed: 09/22/2021 Elsevier Patient Education  2024 [employer].

## 2024-02-01 ENCOUNTER — Encounter: Payer: Self-pay | Admitting: Internal Medicine

## 2024-02-01 LAB — URINALYSIS, ROUTINE W REFLEX MICROSCOPIC
Bilirubin Urine: NEGATIVE
Ketones, ur: NEGATIVE
Leukocytes,Ua: NEGATIVE
Nitrite: NEGATIVE
Specific Gravity, Urine: >=1.03 — AB (ref 1.000–1.030)
Total Protein, Urine: NEGATIVE
Urine Glucose: NEGATIVE
Urobilinogen, UA: 0.2 (ref 0.0–1.0)
pH: 6 (ref 5.0–8.0)

## 2024-02-01 MED ORDER — NEBIVOLOL HCL 10 MG PO TABS: 10.0000 mg | ORAL_TABLET | Freq: Every day | ORAL | 1 refills | Status: DC

## 2024-02-01 MED ORDER — TRIAMTERENE-HCTZ 37.5-25 MG PO CAPS: 1.0000 | ORAL_CAPSULE | Freq: Every day | ORAL | 1 refills | Status: DC

## 2024-02-01 MED ORDER — MONTELUKAST SODIUM 10 MG PO TABS: 10.0000 mg | ORAL_TABLET | Freq: Every day | ORAL | 1 refills | Status: AC

## 2024-02-01 MED ORDER — FAMOTIDINE 20 MG PO TABS: 20.0000 mg | ORAL_TABLET | Freq: Two times a day (BID) | ORAL | 1 refills | Status: AC

## 2024-02-01 MED ORDER — ZEPBOUND 15 MG/0.5ML ~~LOC~~ SOAJ: 15.0000 mg | SUBCUTANEOUS | 1 refills | Status: DC

## 2024-02-02 ENCOUNTER — Other Ambulatory Visit (HOSPITAL_COMMUNITY): Payer: Self-pay

## 2024-02-02 ENCOUNTER — Telehealth: Payer: Self-pay

## 2024-02-02 ENCOUNTER — Encounter: Payer: Self-pay | Admitting: Internal Medicine

## 2024-02-02 NOTE — Telephone Encounter (Signed)
 Pharmacy Patient Advocate Encounter   Received notification from Pt Calls Messages that prior authorization for Zepbound 15mg /0.34ml is required/requested.   Insurance verification completed.   The patient is insured through [facility] .   Per test claim: PA required; PA submitted to above mentioned insurance via CoverMyMeds Key/confirmation #/EOC [id number] Status is pending

## 2024-02-02 NOTE — Telephone Encounter (Signed)
 Patient needs a PA for her Zepbound.

## 2024-02-06 ENCOUNTER — Other Ambulatory Visit (HOSPITAL_COMMUNITY): Payer: Self-pay

## 2024-02-06 NOTE — Telephone Encounter (Signed)
 Pharmacy Patient Advocate Encounter  Received notification from [facility] that Prior Authorization for Zepbound 15mg /0.79ml has been APPROVED from 02/02/24 to 02/01/25

## 2024-02-07 ENCOUNTER — Other Ambulatory Visit (HOSPITAL_COMMUNITY): Payer: Self-pay

## 2024-02-07 LAB — HM MAMMOGRAPHY

## 2024-02-09 ENCOUNTER — Encounter: Payer: Self-pay | Admitting: Internal Medicine

## 2024-03-14 ENCOUNTER — Encounter: Admitting: Obstetrics and Gynecology

## 2024-03-22 ENCOUNTER — Other Ambulatory Visit (HOSPITAL_COMMUNITY): Payer: Self-pay

## 2024-03-26 ENCOUNTER — Other Ambulatory Visit: Payer: Self-pay | Admitting: Internal Medicine

## 2024-03-26 MED ORDER — ZEPBOUND 15 MG/0.5ML ~~LOC~~ SOAJ: 15.0000 mg | SUBCUTANEOUS | 1 refills | Status: DC

## 2024-04-02 ENCOUNTER — Telehealth: Payer: Self-pay | Admitting: Internal Medicine

## 2024-04-02 NOTE — Telephone Encounter (Signed)
 Copied from CRM [id number]. Topic: Clinical - Prescription Issue >> Apr 02, 2024  9:58 AM Orien Bird wrote: Reason for CRM: Patient is wanting to know the status of the prior authorization on her Zepbound . Patient is needing this now as we speak.  ---  Appears pt's rx was refilled on 4.28.25

## 2024-04-04 NOTE — Telephone Encounter (Signed)
 Advised Patient that her medication has been approved. She gave a verbal understanding.

## 2024-04-05 ENCOUNTER — Other Ambulatory Visit: Payer: Self-pay | Admitting: Internal Medicine

## 2024-04-05 DIAGNOSIS — I10 Essential (primary) hypertension: Secondary | ICD-10-CM

## 2024-04-05 DIAGNOSIS — I119 Hypertensive heart disease without heart failure: Secondary | ICD-10-CM

## 2024-04-10 NOTE — Telephone Encounter (Signed)
 Do she need to be seen ? Or can we change the medication

## 2024-04-10 NOTE — Telephone Encounter (Signed)
 Patient is needing to know if she needs to come and be seen again she is requesting wegoovy due to her insurance company no longer paying for the other medication

## 2024-04-11 ENCOUNTER — Other Ambulatory Visit (HOSPITAL_COMMUNITY): Payer: Self-pay

## 2024-04-11 ENCOUNTER — Telehealth: Payer: Self-pay

## 2024-04-11 ENCOUNTER — Other Ambulatory Visit: Payer: Self-pay | Admitting: Internal Medicine

## 2024-04-11 DIAGNOSIS — E66812 Obesity, class 2: Secondary | ICD-10-CM

## 2024-04-11 MED ORDER — INSULIN PEN NEEDLE 32G X 6 MM MISC: 1.0000 | 0 refills | Status: AC

## 2024-04-11 MED ORDER — WEGOVY 2.4 MG/0.75ML ~~LOC~~ SOAJ: 2.4000 mg | SUBCUTANEOUS | 0 refills | Status: DC

## 2024-04-11 NOTE — Telephone Encounter (Signed)
 Pharmacy Patient Advocate Encounter  Received notification from [employer] that Prior Authorization for Wegovy  2.4 has been APPROVED from 04/11/24 to 11/11/24. Ran test claim, Copay is $386.12 due to patient deductible. This test claim was processed through [facility]- copay amounts may vary at other pharmacies due to pharmacy/plan contracts, or as the patient moves through the different stages of their insurance plan.   PA #/Case ID/Reference #: [id number]

## 2024-04-11 NOTE — Telephone Encounter (Signed)
 Patient has been made aware and gave a verbal understanding.

## 2024-04-11 NOTE — Telephone Encounter (Signed)
 Pharmacy Patient Advocate Encounter   Received notification from Patient Pharmacy that prior authorization for Wegovy  is required/requested.   Insurance verification completed.   The patient is insured through [facility] [employer] .   Per test claim: PA required; PA submitted to above mentioned insurance via CoverMyMeds Key/confirmation #/EOC [id number] Status is pending

## 2024-04-12 ENCOUNTER — Other Ambulatory Visit (HOSPITAL_COMMUNITY): Payer: Self-pay

## 2024-04-25 ENCOUNTER — Encounter: Admitting: Obstetrics and Gynecology

## 2024-05-21 ENCOUNTER — Encounter: Payer: No Typology Code available for payment source | Admitting: Internal Medicine

## 2024-06-07 ENCOUNTER — Encounter: Admitting: Internal Medicine

## 2024-08-29 ENCOUNTER — Other Ambulatory Visit: Payer: Self-pay | Admitting: Internal Medicine

## 2024-10-01 ENCOUNTER — Other Ambulatory Visit (HOSPITAL_COMMUNITY): Payer: Self-pay

## 2024-10-01 ENCOUNTER — Telehealth: Payer: Self-pay

## 2024-10-01 NOTE — Telephone Encounter (Signed)
 Pharmacy Patient Advocate Encounter   Received notification from CoverMyMeds that prior authorization for Wegovy  2.4mg /0.46ml is required/requested.   Insurance verification completed.   The patient is insured through [facility].   Per test claim: Patient to enroll in [place] for coverage. Prior authorization expires 02/01/25.

## 2024-10-04 NOTE — Telephone Encounter (Signed)
 Patient has been made aware and gave a verbal understanding.

## 2024-10-10 ENCOUNTER — Telehealth: Payer: Self-pay

## 2024-10-10 NOTE — Telephone Encounter (Signed)
 Copied from CRM [id number]. Topic: Clinical - Prescription Issue >> Oct 10, 2024  1:12 PM Mia F wrote: Reason for CRM: Pt wants to inform office that the pharmacy states they may be reaching out rearding the WEGOVY  rx. She says she is not sure what they may be reaching out about but she say on the noticed she received about the approval of her rx that they will be reaching out to office

## 2024-10-12 NOTE — Telephone Encounter (Signed)
 Noted

## 2024-10-15 ENCOUNTER — Other Ambulatory Visit (HOSPITAL_COMMUNITY): Payer: Self-pay

## 2024-11-14 ENCOUNTER — Other Ambulatory Visit (HOSPITAL_COMMUNITY): Payer: Self-pay

## 2024-11-28 ENCOUNTER — Other Ambulatory Visit (HOSPITAL_COMMUNITY): Payer: Self-pay

## 2024-11-28 ENCOUNTER — Telehealth: Payer: Self-pay

## 2024-11-28 NOTE — Telephone Encounter (Signed)
 Pharmacy Patient Advocate Encounter   Received notification from CoverMyMeds that prior authorization for Wegovy  2.4mg /0.86ml is required/requested.   Insurance verification completed.   The patient is insured through [facility]   Per test claim:    Patient must enroll in [place] for coverage. Prior authorization expires 02/01/25.

## 2024-11-30 ENCOUNTER — Other Ambulatory Visit (HOSPITAL_COMMUNITY): Payer: Self-pay

## 2024-11-30 NOTE — Telephone Encounter (Signed)
 Patient has been made aware. She states that she will pay for this prescription and get this information updated in the chart for us .

## 2024-11-30 NOTE — Telephone Encounter (Signed)
 Patient has been made aware and states that she is in [place] already

## 2024-12-01 ENCOUNTER — Other Ambulatory Visit: Payer: Self-pay | Admitting: Internal Medicine

## 2024-12-01 DIAGNOSIS — I10 Essential (primary) hypertension: Secondary | ICD-10-CM

## 2024-12-01 DIAGNOSIS — I119 Hypertensive heart disease without heart failure: Secondary | ICD-10-CM

## 2024-12-03 NOTE — Telephone Encounter (Signed)
 Patient has called to advised that she has uploaded the needed documents for the pre auth to be done for medication.

## 2024-12-04 ENCOUNTER — Other Ambulatory Visit (HOSPITAL_COMMUNITY): Payer: Self-pay

## 2024-12-04 NOTE — Telephone Encounter (Signed)
 Can we re run her PA with the corrected insurance?

## 2024-12-05 ENCOUNTER — Telehealth: Payer: Self-pay

## 2024-12-05 NOTE — Telephone Encounter (Unsigned)
 Copied from CRM [id number]. Topic: Clinical - Medication Prior Auth >> Dec 05, 2024  9:07 AM Faith Sellers wrote: Reason for CRM: Patient called in stating that she previously spoke to someone from the Medical prior authorization.  For medication: semaglutide -weight management (WEGOVY ) 2.4 MG/0.75ML SOAJ SQ injection -The patient stated she normally pays $25 - $100 for the Rx, but she was informed by her pharmacy that it would be:  $349 - without insurance  Since then, the patient has uploaded her to MyChart and would like to begin using it to cover her medical expenses, including this rx.  I provided her with the Billing Number and Hiring Information. >> Dec 05, 2024 11:29 AM Faith Sellers wrote: Faith Sellers would like a call back from [place] , Faith Sellers, CMA about this chart note:    12/05/24 10:14 AM Note Her PA has been ran through her new insurance and that's the price that they said she would have to pay. I have spoken with the patient and made her aware. She gave a verbal understanding.       Faith Sellers called her insurance and they said that they do cover Zepbound  and Wegovy . Insurance said that her copay should be $25.00 to $165.00. Also, Faith Sellers said she input her insurance through [employer] and spoke with [place]. Prior authorization might have to be resent. Please call her at [phone] Thanks

## 2024-12-05 NOTE — Telephone Encounter (Signed)
 Patient has been made aware and gave a verbal understanding.

## 2024-12-05 NOTE — Telephone Encounter (Signed)
 Her PA has been ran through her new insurance and that's the price that they said she would have to pay. I have spoken with the patient and made her aware. She gave a verbal understanding.

## 2024-12-05 NOTE — Telephone Encounter (Signed)
 Please advise for me.

## 2024-12-05 NOTE — Telephone Encounter (Signed)
 Copied from CRM [id number]. Topic: Clinical - Medication Prior Auth >> Dec 05, 2024  9:07 AM Zy'onna H wrote: Reason for CRM: Patient called in stating that she previously spoke to someone from the Medical prior authorization.  For medication: semaglutide -weight management (WEGOVY ) 2.4 MG/0.75ML SOAJ SQ injection -The patient stated she normally pays $25 - $100 for the Rx, but she was informed by her pharmacy that it would be:  $349 - without insurance  Since then, the patient has uploaded her to MyChart and would like to begin using it to cover her medical expenses, including this rx.  I provided her with the Billing Number and Hiring Information.

## 2024-12-06 ENCOUNTER — Other Ambulatory Visit (HOSPITAL_COMMUNITY): Payer: Self-pay

## 2024-12-06 NOTE — Telephone Encounter (Signed)
 Patient has been made aware.

## 2024-12-19 ENCOUNTER — Encounter: Admitting: Internal Medicine

## 2024-12-27 ENCOUNTER — Encounter: Admitting: Internal Medicine

## 2025-01-23 ENCOUNTER — Encounter: Admitting: Internal Medicine
